# Patient Record
Sex: Female | Born: 1965
Health system: Southern US, Community
[De-identification: ages and names within clinical notes are randomized; demographics above are authoritative.]

## PROBLEM LIST (undated history)

## (undated) DIAGNOSIS — G90519 Complex regional pain syndrome I of unspecified upper limb: Secondary | ICD-10-CM

## (undated) DIAGNOSIS — G709 Myoneural disorder, unspecified: Secondary | ICD-10-CM

## (undated) DIAGNOSIS — K760 Fatty (change of) liver, not elsewhere classified: Secondary | ICD-10-CM

## (undated) DIAGNOSIS — F329 Major depressive disorder, single episode, unspecified: Secondary | ICD-10-CM

## (undated) DIAGNOSIS — K219 Gastro-esophageal reflux disease without esophagitis: Secondary | ICD-10-CM

## (undated) DIAGNOSIS — K2270 Barrett's esophagus without dysplasia: Secondary | ICD-10-CM

## (undated) DIAGNOSIS — Z8719 Personal history of other diseases of the digestive system: Secondary | ICD-10-CM

## (undated) DIAGNOSIS — G9050 Complex regional pain syndrome I, unspecified: Secondary | ICD-10-CM

## (undated) DIAGNOSIS — Z720 Tobacco use: Secondary | ICD-10-CM

## (undated) DIAGNOSIS — F32A Depression, unspecified: Secondary | ICD-10-CM

## (undated) DIAGNOSIS — F419 Anxiety disorder, unspecified: Secondary | ICD-10-CM

## (undated) DIAGNOSIS — R51 Headache: Secondary | ICD-10-CM

## (undated) DIAGNOSIS — M1990 Unspecified osteoarthritis, unspecified site: Secondary | ICD-10-CM

## (undated) DIAGNOSIS — Z8739 Personal history of other diseases of the musculoskeletal system and connective tissue: Secondary | ICD-10-CM

## (undated) DIAGNOSIS — J189 Pneumonia, unspecified organism: Secondary | ICD-10-CM

## (undated) DIAGNOSIS — J449 Chronic obstructive pulmonary disease, unspecified: Secondary | ICD-10-CM

---

## 1986-03-12 HISTORY — PX: TUBAL LIGATION: SHX77

## 2003-03-13 HISTORY — PX: ABDOMINAL HYSTERECTOMY: SHX81

## 2003-10-28 ENCOUNTER — Observation Stay (HOSPITAL_COMMUNITY): Admission: RE | Admit: 2003-10-28 | Discharge: 2003-10-29 | Payer: Self-pay | Admitting: Gynecology

## 2003-10-28 ENCOUNTER — Encounter (INDEPENDENT_AMBULATORY_CARE_PROVIDER_SITE_OTHER): Payer: Self-pay | Admitting: *Deleted

## 2004-12-11 ENCOUNTER — Emergency Department: Payer: Self-pay | Admitting: Emergency Medicine

## 2004-12-13 ENCOUNTER — Ambulatory Visit: Payer: Self-pay | Admitting: Family Medicine

## 2005-03-12 DIAGNOSIS — G90519 Complex regional pain syndrome I of unspecified upper limb: Secondary | ICD-10-CM

## 2005-03-12 HISTORY — DX: Complex regional pain syndrome I of unspecified upper limb: G90.519

## 2006-03-12 HISTORY — PX: CERVICAL DISC ARTHROPLASTY: SHX587

## 2006-03-12 HISTORY — PX: KNEE ARTHROSCOPY: SUR90

## 2006-07-19 ENCOUNTER — Ambulatory Visit: Payer: Self-pay | Admitting: Family Medicine

## 2006-07-23 ENCOUNTER — Ambulatory Visit: Payer: Self-pay | Admitting: Family Medicine

## 2006-10-18 ENCOUNTER — Ambulatory Visit (HOSPITAL_COMMUNITY): Admission: RE | Admit: 2006-10-18 | Discharge: 2006-10-19 | Payer: Self-pay | Admitting: Orthopedic Surgery

## 2007-03-13 HISTORY — PX: SPINAL CORD STIMULATOR IMPLANT: SHX2422

## 2007-04-02 ENCOUNTER — Ambulatory Visit: Payer: Self-pay | Admitting: Family Medicine

## 2007-04-16 ENCOUNTER — Ambulatory Visit (HOSPITAL_COMMUNITY): Admission: RE | Admit: 2007-04-16 | Discharge: 2007-04-18 | Payer: Self-pay | Admitting: Orthopedic Surgery

## 2007-09-24 ENCOUNTER — Ambulatory Visit: Payer: Self-pay | Admitting: Family Medicine

## 2008-02-16 ENCOUNTER — Encounter: Admission: RE | Admit: 2008-02-16 | Discharge: 2008-02-16 | Payer: Self-pay | Admitting: Orthopedic Surgery

## 2008-04-15 ENCOUNTER — Encounter: Admission: RE | Admit: 2008-04-15 | Discharge: 2008-04-15 | Payer: Self-pay | Admitting: Specialist

## 2008-10-11 IMAGING — RF DG CERVICAL SPINE 2 OR 3 VIEWS
1 series · 2 of 2 positions shown · non-contrast
Comparison: none

CLINICAL DATA: Chronic neck pain. 
 CERVICAL SPINE ? 04/16/07:

[Series 1: run · 2 of 2 slices shown]
[im 1/2]
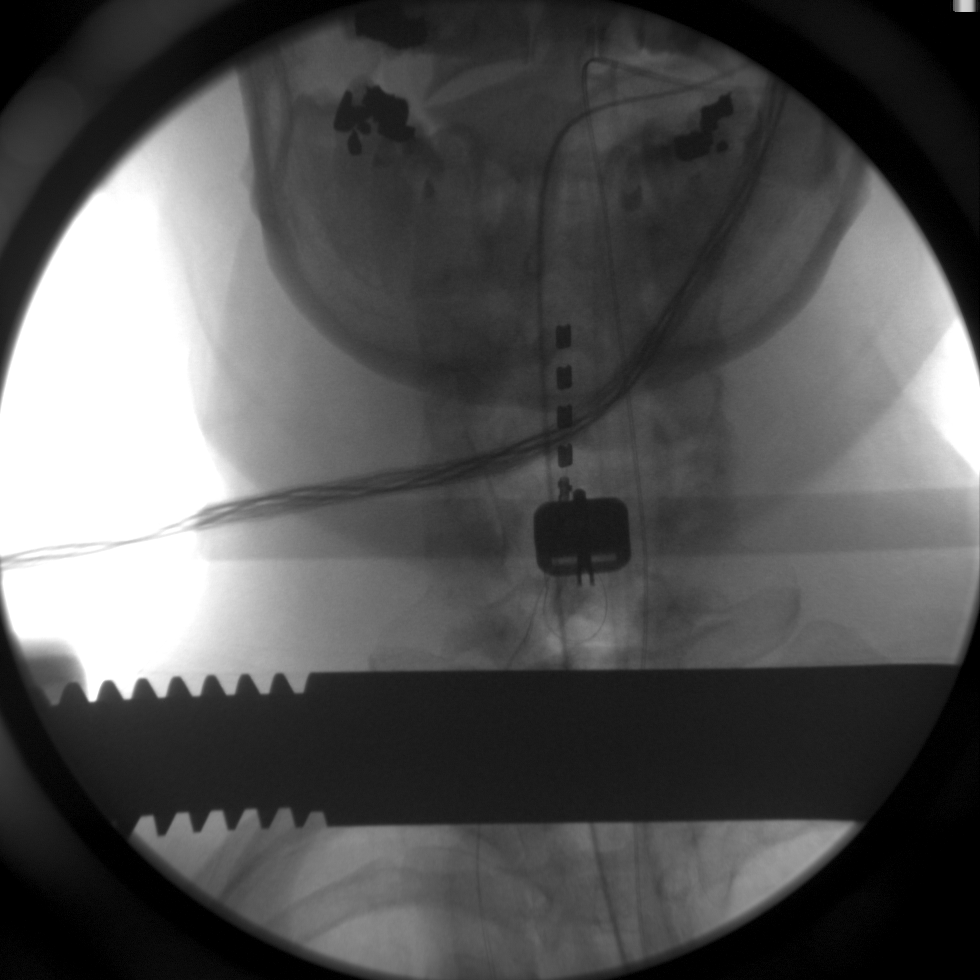
[im 2/2]
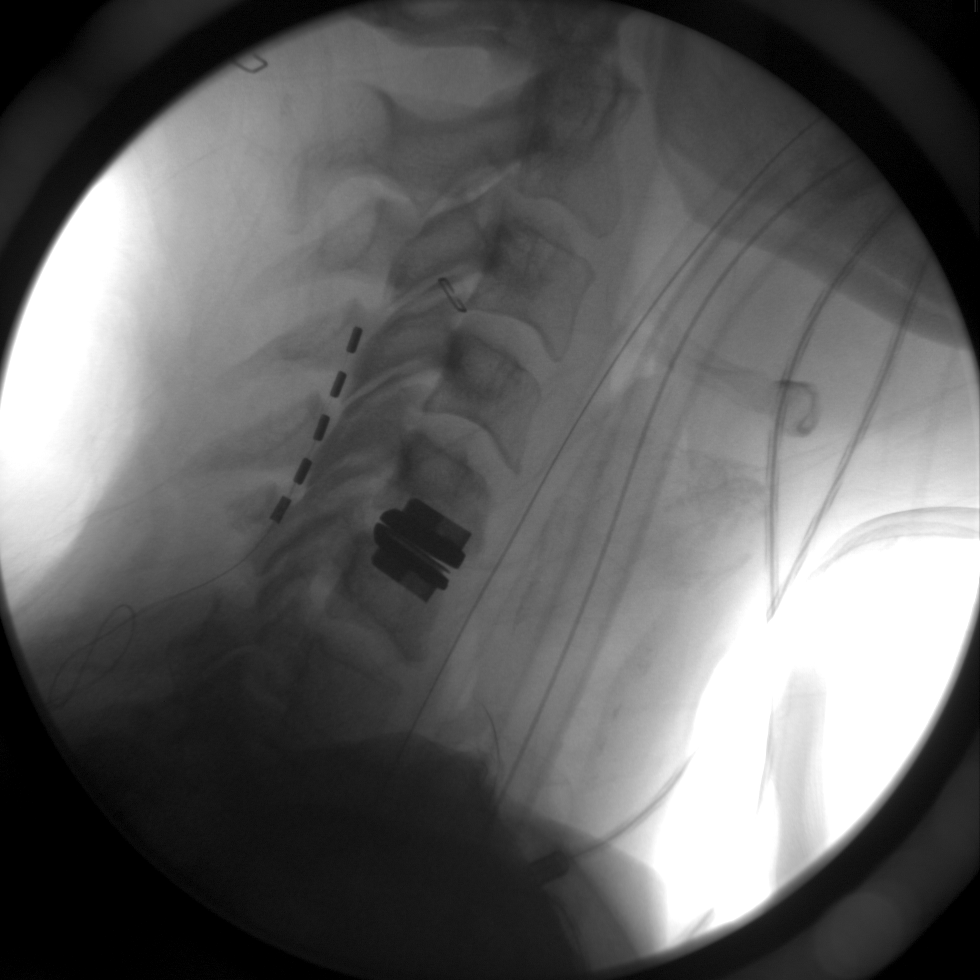

[2 of 2 positions shown; findings below may reference images not displayed]

FINDINGS: The patient has undergone insertion of cervical stimulator.  The superior aspect of the visible portion of the stimulator is at C4 and the inferior aspect of the stimulator is at C6 with the wire extending into the soft tissue below that level.
IMPRESSION: Stimulator inserted from C4 to C6.

## 2009-03-12 HISTORY — PX: OOPHORECTOMY: SHX86

## 2009-03-12 LAB — HM DEXA SCAN

## 2009-03-25 ENCOUNTER — Ambulatory Visit: Payer: Self-pay | Admitting: Family Medicine

## 2009-03-31 ENCOUNTER — Ambulatory Visit: Payer: Self-pay | Admitting: Family Medicine

## 2009-04-07 ENCOUNTER — Ambulatory Visit: Payer: Self-pay | Admitting: Family Medicine

## 2009-05-04 ENCOUNTER — Ambulatory Visit: Payer: Self-pay | Admitting: Obstetrics & Gynecology

## 2009-05-10 ENCOUNTER — Ambulatory Visit: Payer: Self-pay | Admitting: Obstetrics & Gynecology

## 2010-01-20 ENCOUNTER — Inpatient Hospital Stay: Payer: Self-pay | Admitting: Internal Medicine

## 2010-05-11 ENCOUNTER — Ambulatory Visit: Payer: Self-pay | Admitting: Family Medicine

## 2010-07-25 NOTE — Op Note (Signed)
NAMEMAPLE, ODANIEL              ACCOUNT NO.:  [id number]   MEDICAL RECORD NO.:  [id number]          PATIENT TYPE:  OIB   LOCATION:  5034                         FACILITY:  [facility]   PHYSICIAN:  Alvy Beal, MD    DATE OF BIRTH:  [birth year]   DATE OF PROCEDURE:  04/17/2007  DATE OF DISCHARGE:                               OPERATIVE REPORT   PREOPERATIVE DIAGNOSIS:  Migrating spinal cord stimulator.   POSTOPERATIVE DIAGNOSIS:  Migrating spinal cord stimulator.   OPERATIVE PROCEDURE:  Revision of spinal cord lead placement.   HISTORY:  Tara [name] is a very pleasant 45 year old woman who  underwent a spinal cord cervical stimulator yesterday.  The patient was  then later on when it was turned on noted only left-sided arm relief.  At that point we realized that the thin lead had migrated from the right  to the left where it was initially placed.  As a result of the absence  of coverage on the right side, we elected to take her back to the  operating room today to reposition it and further secured into position.   All appropriate risks, benefits and alternatives to surgery were  discussed and consent was obtained.   OPERATIVE NOTE:  The patient is brought to the operating room, placed on  the operating table.  After successful induction of general anesthesia  with endotracheal intubation, the patient was turned prone onto a Wilson  frame.  The arms were tucked at the side and the neck and back were  prepped and draped in standard fashion.  The vertical nylon sutures were  removed and the wound was reopened.  The superficial 2-0 Vicryl and 2-0  and 1 Vicryl sutures were removed to expose the cervical spine.  At this  point I removed the lead without difficulty.  I then repalpated the  plane on that right side and freely passed the lead back over on the  right side.  At this time I took a second plastic tack and then sutured  the lead to the right-hand side of the right  paraspinal muscles and  fascia.  This ensured that it would not migrate over to the left again.  At this point I tested the battery again, we had excellent function.  I  then irrigated the wound copiously with normal saline, closed the deep  fascia, interrupted #1 Vicryl sutures, superficial 2-0 and 3-0 vertical  nylon for the skin.  A dry dressing was applied as was soft collar.  The  patient was extubated, transferred PACU without incident.  End of the  case all needle, sponge counts were correct.   FIRST ASSISTANT:  Crissie Reese, PA      Alvy Beal, MD  Electronically Signed     DDB/MEDQ  D:  04/17/2007  T:  04/18/2007  Job:  [id number]

## 2010-07-25 NOTE — Op Note (Signed)
Tara [name], Tara [name]              ACCOUNT NO.:  [id number]   MEDICAL RECORD NO.:  [id number]          PATIENT TYPE:  AMB   LOCATION:  SDS                          FACILITY:  [facility]   PHYSICIAN:  Alvy Beal, MD    DATE OF BIRTH:  1965/12/14   DATE OF PROCEDURE:  10/18/2006  DATE OF DISCHARGE:                               OPERATIVE REPORT   PREOPERATIVE DIAGNOSIS:  Hard disk osteophyte with C6 radicular right  arm pain.   POSTOPERATIVE DIAGNOSIS:  Hard disk osteophyte with C6 radicular right  arm pain.   OPERATIVE PROCEDURE:  Total disk replacement C5-6 using a Synthes  ProDisc C.   COMPLICATIONS:  None.   CONDITION:  Stable.   HISTORY:  This is a very pleasant woman with significant right arm pain.  She also had a degenerative hard disk osteophyte noted posterolateral to  the right at C5-6 on MRI.  She was diagnosed with the right C6  radiculopathy complicated by a right arm RSD.  Multiple attempts at  physiotherapy, injection therapy have failed to relieve her symptoms. As  a result, she presented to me for further treatment.   After discussing treatment options, we elected to proceed with a total  disk replacement to preserve a motion, decompress the nerve and  alleviate her component of her right arm.  All appropriate risks,  benefits and alternatives were discussed and consent was obtained.   OPERATIVE NOTE:  The patient is brought to the operating room, placed  supine on the operating table.  After successful induction of general  anesthesia and endotracheal intubation, TEDs, SCDs and a Foley were  applied.  Appropriate intraoperative evoked motor, sensory and running  EMGs potential were connected to the patient.  It should be noted that  at no point in time were there any adverse electrodiagnostic readings.   A roll of towels was placed behind the shoulder blades.  The neck was  positioned in slight extension and the head in neutral.  The shoulder  were secured  down and the neck was prepped and draped in standard  fashion.  A transverse incision was made over the C5-6 disk space.  Sharp dissection was carried out down to and through the platysma.  I  then sharply dissected in a deep cervical fascia along the lateral  border of the sternocleidomastoid muscle.  I was able to then bluntly  dissect through the cervical and prevertebral fascia.  I kept the  esophagus and trachea medial and protected it with a finger and I  palpated and visualized the carotid sheath laterally.  Then I was able  to palpate the anterior aspect of the cervical spine.  I then swept the  trachea and esophagus medially and protected it with appendiceal  retractor.  I then resected the remaining the deep paravertebral fascia  to expose the entire C5 and C6 vertebral body.  I then marked the  interbody disk space and took an x-ray to confirm that this was the  appropriate level.   Once confirmed that I was at the appropriate level, I then resected the  anterior longitudinal ligament and mobilized the longus colli muscles  out to the level of the uncovertebral joints.  Strict hemostasis was  obtained using bipolar electrocautery.  The Caspar self-retaining  retractors were placed deep into the wound and then the endotracheal  cuff was deflated and the blades were expanded to the appropriate width.  At this point, with the visualization adequate, I then used AP  fluoroscopy to confirm the midline position on the cervical spine.  Once  I confirmed the midline of C5 and C6, I then took a marking awl and  marked this.   I then switched to the lateral and then advanced the distraction pin at  the superior margin of C5, parallel to the C5 inferior endplate.  I then  placed another distraction pin at the inferior aspect of C6 again  parallel to the superior endplate of C6.  Once distraction pins were in  place I then proceeded with our decompression.   An annulotomy was performed  with a 15 blade scalpel.  Using a  combination of pituitary rongeurs and curettes and Kerrison rongeurs, I  resected the majority of the disk all the way down to the posterior  annulus.  I then placed in the Synthes ProDisc C distractor into the  wound, distracted the intervertebral space and then used the distraction  pins to maintain by distraction.  I then removed the distractor and I  was able to use a fine curette to develop a plane behind the posterior  annulus and posterior longitudinal ligament.  Then using a 1 mm  Kerrison, I resected the entire posterior longitudinal ligament.  I also  went out the right side and took down most of the osteophytes that were  compressing that right C6 nerve root.   There was a small soft disk protrusion noted on that right side.  At  this point, once I was satisfied with my decompression, I irrigated the  wound copiously with normal saline and then placed the five large trial  into the wound.  This had adequate medial, lateral, anterior, posterior  depth.  With the device secured in place, I then put the locking pin  superiorly and the mill inferiorly and then milled the trough on the  superior endplate of C6.  I then switched the locking pin and then  milled the trough at C5.  At this point, I irrigated the wound again and  then used the trough curette to clean out the trough.   I then took the five large prosthesis and malleted to the appropriate  depth.  I confirmed the depth on the lateral view and it was  satisfactory.   With the prosthesis properly settled, I then removed all of the  retainers and then under live fluoro, took the neck through a range of  motion.  The range of motion was satisfactory and then I directly  visualized the disk and took it through a range of motion and it was  satisfactory.   At this point, with the disk properly placed, I then irrigated the wound  copiously with normal saline and used bone wax to obtain  hemostasis from  the screw hole site and from any exposed bone site.  Once I had strict  hemostasis, I then returned the trachea and esophagus to midline, closed  the platysma with 2-0 Vicryl sutures and used the 3-0 Monocryl for the  skin.  Steri-Strips and dry dressing were applied as was a soft collar.  The patient was then extubated and transferred to the PACU without  incident.  At the end of the case, all needle and sponge counts were  correct.      Alvy Beal, MD  Electronically Signed     DDB/MEDQ  D:  10/18/2006  T:  10/18/2006  Job:  [id number]

## 2010-07-25 NOTE — Op Note (Signed)
NAMEANNALIS, KACZMARCZYK              ACCOUNT NO.:  [id number]   MEDICAL RECORD NO.:  [id number]          PATIENT TYPE:  AMB   LOCATION:  SDS                          FACILITY:  [facility]   PHYSICIAN:  Alvy Beal, MD    DATE OF BIRTH:  12-Aug-1965   DATE OF PROCEDURE:  04/16/2007  DATE OF DISCHARGE:                               OPERATIVE REPORT   PREOPERATIVE DIAGNOSIS:  Chronic debilitating right arm pain.   POSTOPERATIVE DIAGNOSIS:  Chronic debilitating right arm pain.   OPERATIVE PROCEDURE:  Insertion of cervical spinal cord stimulator.   HISTORY:  This is a very pleasant 45 year old woman who in the past  underwent a cervical total disk replacement for severe arm pain and neck  pain.  She states that some of her arm pain recovered but she still had  the RSD pain.  As a result, a cervical spinal stimulator trial was  undertaken and she states there was improvement in her overall pain.  As  a result, the decision was made to place a permanent indwelling  catheter.  All appropriate risks, benefits and alternatives were  discussed with the patient.  Consent was obtained.   OPERATIVE NOTE:  The patient was brought to the operating room and  placed supine upon the operating table.  After successful induction of  general anesthesia and endotracheal intubation, she was placed prone  onto a Wilson frame.  The neck was placed into flexion, the arms were  placed at the side, and all bony prominences well-padded.   The neck and entire spine were prepped and draped in standard fashion.  A midline cervical incision was made and sharp dissection was carried  out down through the fascia down to the deep fascia.  Deep fascia was  sharply incised to expose the tops of the spinous processes of C5, C6  and C7.  I then removed the paraspinal musculature with a Cobb elevator  bilaterally to expose the lamina.  Care was taken not to violate the  facet capsule (facet complex) as this may cause  instability of the  cervical disk anteriorly.  With the lamina and spinous process exposed,  I then resected the inferior portion of the C6 spinous process.  I then  developed a plane underneath the C6 lamina and used a 2-mm Kerrison to  perform a laminotomy bilaterally.  At this point I then developed a  plane using a fine curette through the raphe of the ligamentum flavum.  I resected ligamentum flavum to expose the underlying thecal sac.  At  this point with the thecal sac exposed, I gently dissected through the  adherent ligamentum flavum to be able to pass the electric catheter.  I  then was able to freely pass the catheter without resistance.  At this  point I then secured it.  I then used a 2-0 FiberWire through the  spinous process to secure the lead to the spinous process.  I then  wrapped it around the spinous process and secured it again with a #1  Vicryl suture.  With the lead properly positioned, I confirmed central  and slightly to the right positioning with the AP and lateral  fluoroscopy views.  At this point having reproduced the preoperative  lead placement, I then made the left-hand incision over the inferior  aspect of her belt line.  I then sharply dissected down to a depth of  2.5 cm and then created a pocket for the battery.  I then tested the  lead to ensure that it was working satisfactorily.  I then passed the  leads submuscular from the cervical incision down to the lower pelvic  incision.  I then sutured the battery in place with a 2-0 Fibrewire and  then checked it again to ensure that after tunneling the lead in  position to the battery that it worked.  Once confirmed, I irrigated the  wounds copiously with normal saline, closed the deep fascia of both  wounds with interrupted #1 Vicryl sutures and then superficial with 2-0  Vicryl sutures.  For the neck a 3-0 nylon running vertical mattress  suture was used to close  the skin.  For the lower pelvic wound a  3-0 Monocryl was used.  Steri-  Strips, dry dressing applied.  The patient was extubated and transferred  to the PACU without incident.  At the end of the case all needle and  sponge counts were correct.      Alvy Beal, MD  Electronically Signed     DDB/MEDQ  D:  04/16/2007  T:  04/17/2007  Job:  [id number]

## 2010-07-28 NOTE — Op Note (Signed)
Tara [name], Tara [name]                          ACCOUNT NO.:  [id number]   MEDICAL RECORD NO.:  [id number]                   PATIENT TYPE:  OBV   LOCATION:  9399                                 FACILITY:  [facility]   PHYSICIAN:  Ginger Carne, MD               DATE OF BIRTH:  12/27/1965   DATE OF PROCEDURE:  10/28/2003  DATE OF DISCHARGE:                                 OPERATIVE REPORT   PREOPERATIVE DIAGNOSES:  Menometrorrhagia.   POSTOPERATIVE DIAGNOSES:  Menometrorrhagia.   PROCEDURE:  Total vaginal hysterectomy with preservation of both tubes and  ovaries.   SURGEON:  Ginger Carne, MD   ASSISTANT:  Burnadette Peter, M.D.   ESTIMATED BLOOD LOSS:  Less than 75 mL.   COMPLICATIONS:  None immediate.   ANESTHESIA:  General.   SPECIMENS:  Uterus and cervix with preservation of both tubes and ovaries.   FINDINGS:  The uterus was upper limits of normal size at about eight weeks.  Both tubes and ovaries appeared normal.   DESCRIPTION OF PROCEDURE:  The patient was prepped and draped in the usual  fashion and placed in the supine lithotomy position. Betadine solution used  for antiseptic and the patient was catheterized prior to the procedure.  After adequate general anesthesia, a double tooth tenaculum placed on the  anterior and posterior lips of the cervix.  20 mL of Sensorcaine with 0.5%  epinephrine utilized circumferentially around the cervix. Two centimeters of  anterior and posterior vaginal epithelium were incised transversely.  Peritoneal reflection is opened without incident, uterosacral cardinal  ligament complex was clamped, cut and ligated with #0 Vicryl suture and  affixed to their respective vaginal walls. The uterine vasculatures,  ascending branches, broad ligaments were clamped, cut, and ligated with  transfixation #0 Vicryl suture, round ligaments taken separately with #0  Vicryl suture and the uteroovarian ligaments taken with #0 Vicryl suture.  Bleeding points hemostatically checked, blood clots removed from the  abdomen. Closure of the cuff in one layer with #0 Monocryl running  interlocking suture. The patient tolerated the procedure well and went to  the post anesthesia recovery room in excellent condition.                                               Ginger Carne, MD    SHB/MEDQ  D:  10/28/2003  T:  10/29/2003  Job:  [id number]

## 2010-07-28 NOTE — Discharge Summary (Signed)
NAMETHAILY, HACKWORTH                          ACCOUNT NO.:  [id number]   MEDICAL RECORD NO.:  [id number]                   PATIENT TYPE:  OBV   LOCATION:  9124                                 FACILITY:  [facility]   PHYSICIAN:  Ginger Carne, MD               DATE OF BIRTH:  1965-07-26   DATE OF ADMISSION:  10/28/2003  DATE OF DISCHARGE:                                 DISCHARGE SUMMARY   FINAL DISCHARGE DIAGNOSES:  Menometrorrhagia.   IN-HOSPITAL PROCEDURES:  Total vaginal hysterectomy with preservation of  both tubes and ovaries.   HOSPITAL COURSE:  This patient is a 45 year old Caucasian female who  underwent the aforementioned procedures on October 28, 2003 because of a  longstanding history of menometrorrhagia.  Her postoperative course was  uneventful.  She was afebrile, voided well with minimal vaginal drainage.  Her hemoglobin postoperatively was 11.4 from a preoperative value of 13.2.  Routine postoperative and postpartum instructions provided to said patient.  She received Percocet 5/325 one to two q.4-6h. for prescription analgesia  and asked to return to the office in 4 weeks.  She was advised to call the  office if she develops a temperature greater than 100.4 degrees Fahrenheit,  vaginal bleeding, increased vaginal discharge, abdominal pain, or flank  pain.                                               Ginger Carne, MD    SHB/MEDQ  D:  10/29/2003  T:  10/29/2003  Job:  [id number]

## 2010-07-28 NOTE — H&P (Signed)
Tara [name], Tara [name]                          ACCOUNT NO.:  [id number]   MEDICAL RECORD NO.:  [id number]                   PATIENT TYPE:  AMB   LOCATION:  SDC                                  FACILITY[facility]   PHYSICIAN:  Ginger Carne, MD               DATE OF BIRTH:  Sep 21, 1965   DATE OF ADMISSION:  10/24/2003  DATE OF DISCHARGE:                                HISTORY & PHYSICAL   ADMITTING DIAGNOSIS:  Menometrorrhagia with severe dysmenorrhea.   HISTORY OF PRESENT ILLNESS:  This patient is a 46 year old gravida 2, para 0-  0-0-2 Caucasian female admitted for a total vaginal hysterectomy and  preservation of both tubes and ovaries.  The patient states that over the  past 18 months to 2 years she has had worsening dysmenorrhea and bleeding  lasting anywhere from 8-12 days out of the 30 days.  She denies pain between  her menses or dyspareunia but has discomfort that has prohibited her from  going to work at least for 3-4 days out of the month because of her  discomfort.  She has been on various forms of oral contraceptives over the  past year by the primary care physician with minimal benefit.  The patient  has had a history of cryosurgery in the past leading to a significantly  stenosed cervix contributing to her discomfort.  A sonohysterogram  (transvaginal) ultrasound demonstrates no evidence of intracavitary lesions.  However, there is adenomyosis noted.  An endometrial biopsy revealed no  evidence for neoplasia or hyperplasia.  Said procedure required significant  local analgesia and dilatation of the cervix.  She takes no medications to  enhance her bleeding propensity and has no personal family history of  bleeding diatheses.  She does not have any endocrinological sources for her  bleeding.   OBSTETRICAL GYNECOLOGICAL HISTORY:  The patient has had two vaginal  deliveries in 1984 and 1988 followed by a tubal ligation.  She had  cryosurgery in 1997.   CURRENT  MEDICATIONS:  Ambien 5 mg as needed.   ALLERGIES:  None.   SOCIAL HISTORY:  Positive for smoking one pack of cigarettes per day but  denies alcohol or illicit drug abuse.   FAMILY HISTORY:  Negative for breast, colon, ovarian, uterine carcinoma.   REVIEW OF SYSTEMS:  Negative.   SOCIAL HISTORY:  Negative.   PHYSICAL EXAMINATION:  GENERAL:  Physical exam reveals a pleasant female in  no acute distress.  VITAL SIGNS:  Weight 138 pounds, blood pressure 98/46, height 5 feet 8-1/2  inches.  HEENT:  Grossly normal.  CARDIOVASCULAR:  Normal, S1 and S2 within normal limits.  Regular rate and  rhythm.  Without murmurs.  BREASTS:  Without masses, discharge, thickening or tenderness.  CHEST:  Clear to percussion and auscultation  EXTREMITIES:  Within normal limits.  LYMPHATICS:  Within normal limits.  SKIN:  Within normal limits.  NEUROLOGICAL:  Within normal  limits.  MUSCULOSKELETAL:  Within normal limits.  ABDOMEN:  Soft without hepatosplenomegaly.  PELVIC:  External genitalia, vulva, and vagina normal.  Cervix smooth  without erosions or lesions.  The uterus is upper limits of normal size.  Both adnexal are palpable and found to be normal.  Pap smear is normal.  RECTAL:  Hemoccult negative without mass.   IMPRESSION:  1. Menometrorrhagia.  2. Severe dysmenorrhea with cervical stenosis.   PLAN:  The patient has no desire for further childbearing.  She has unable  to utilize oral contraceptives because of her history of smoking.  She has  tried Norethindrone acetate and Provera by her primary care physician with  minimal benefit to control her bleeding.  She declines an intrauterine  device and has also utilized significant amounts of ibuprofen to help  control bleeding and pain with minimal benefit.  She also declines the use  of Depo-Provera because of its side effects and also does not wish to have  an endometrial thermal balloon ablation.  For this reason, a total vaginal   hysterectomy with preservation of both tubes and ovaries was discussed and  accepted by the patient.  She has no psychological or medical indications  that would outweigh the benefits of said surgery.  Ashby Dawes of said surgery  discussed in detail with the patient.  Risks including injuries to bowel and  bladder, possible conversion to laparoscopic or open procedure, hemorrhage  possibly requiring blood transfusion, infection and other unforeseen  complications discussed and understood by said patient.                                               Ginger Carne, MD    SHB/MEDQ  D:  10/24/2003  T:  10/24/2003  Job:  [id number]

## 2010-07-28 NOTE — H&P (Signed)
Tara [name], [name]              ACCOUNT NO.:  [id number]   MEDICAL RECORD NO.:  [id number]          PATIENT TYPE:  OIB   LOCATION:  5019                         FACILITY:  [facility]   PHYSICIAN:  Alvy Beal, MD    DATE OF BIRTH:  [birth year]   DATE OF ADMISSION:  10/18/2006  DATE OF DISCHARGE:  10/19/2006                              HISTORY & PHYSICAL   HISTORY:  Tara [name] is a very pleasant 45 year old woman who has been  having severe complaints of pain in neck and right upper extremity for a  very long period of time.  She was initially seen and treated by my  partner, Dr. Amanda Pea, for an ulnar nerve entrapment.  She was also  diagnosed with a right arm complex regional pain syndrome.  However, she  was still complaining of significant neck pain and was referred to me  for further treatment.  Upon my evaluation, the patient had a component  of a C6 radiculopathy on that right side.  She had some weakness in the  biceps as well as sensory changes in the C6 distribution on the right.  X-rays demonstrated a soft disk/osteophyte complex causing right  foraminal compromise and nerve root irritation at C5-6.  After attempts  at conservative management consisting of physiotherapy and injection  therapy had failed to alleviate her symptoms, we elected to proceed with  surgery.   All appropriate risks, benefits, and alternatives to the procedure were  explained to her.   The patient was ultimately admitted for a total disk replacement at C5-6  versus an anterior cervical diskectomy and fusion at C5-6.   PAST MEDICAL HISTORY:  Outlined in my office note which has been  included from September 02, 2006.  There were no changes.  Please refer to it  for specifics.   PAST SURGICAL HISTORY:  Outlined in my office note which has been  included from September 02, 2006.  There were no changes.  Please refer to it  for specifics.   FAMILY HISTORY:  Outlined in my office note which has been included  from  September 02, 2006.  There were no changes.  Please refer to it for  specifics.   SOCIAL HISTORY:  Outlined in my office note which has been included from  September 02, 2006.  There were no changes.  Please refer to it for  specifics.      Alvy Beal, MD  Electronically Signed     DDB/MEDQ  D:  11/19/2006  T:  11/20/2006  Job:  [id number]

## 2010-09-26 IMAGING — US US PELV - US TRANSVAGINAL
1 series · 17 of 25 positions shown · non-contrast
Comparison: none

REASON FOR EXAM: llq abdominal pain
COMMENTS:

[Series 1: us pelv - us transvaginal · 17 of 64 slices shown]
[im 1/64]
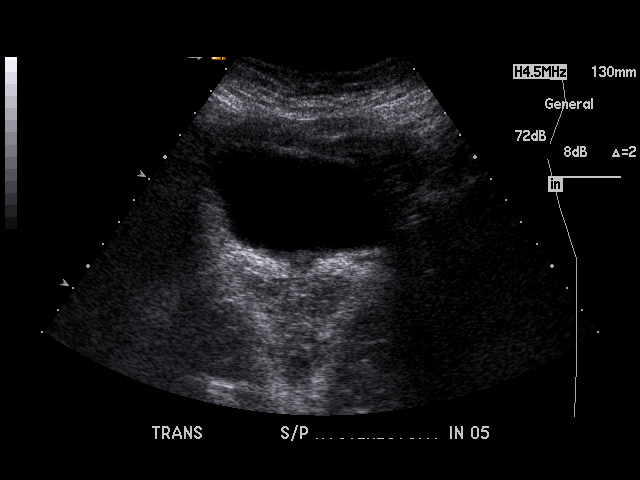
[im 6/64]
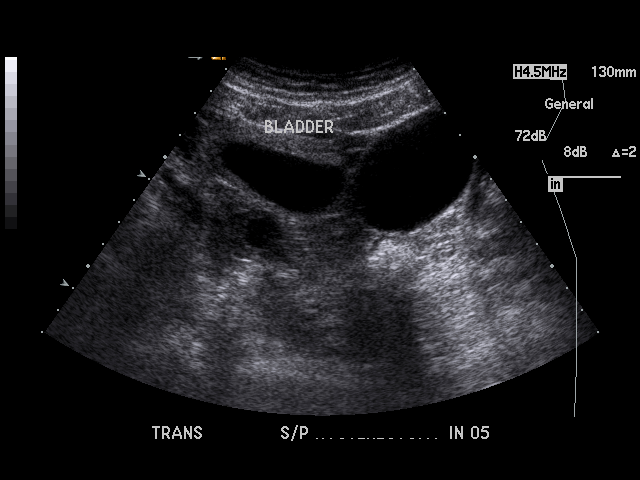
[im 8/64]
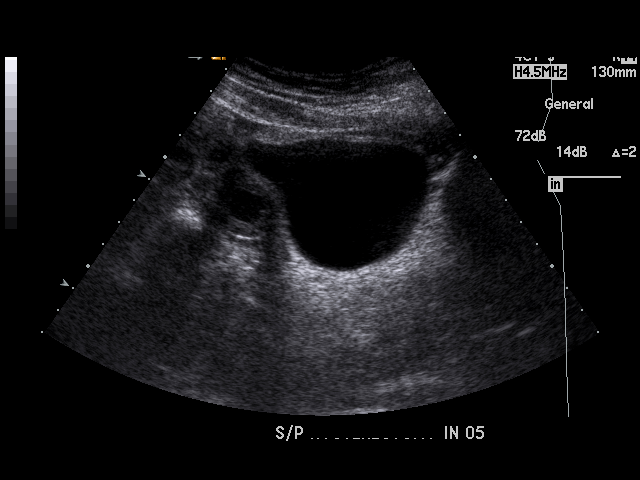
[im 14/64]
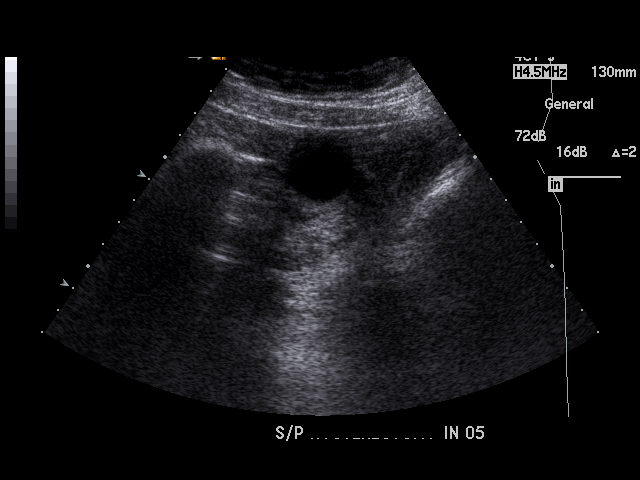
[im 16/64]
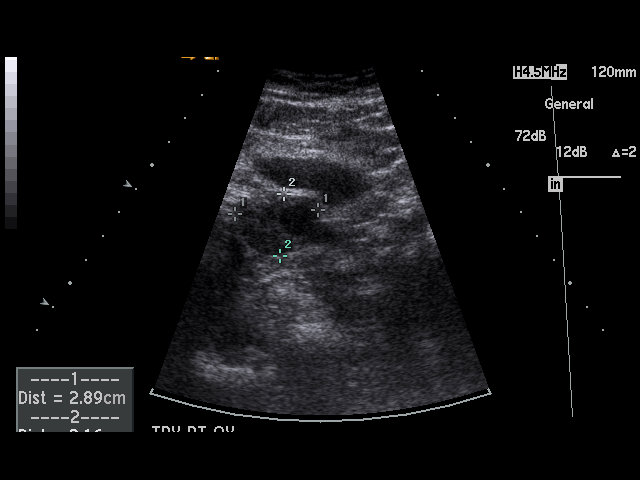
[im 22/64]
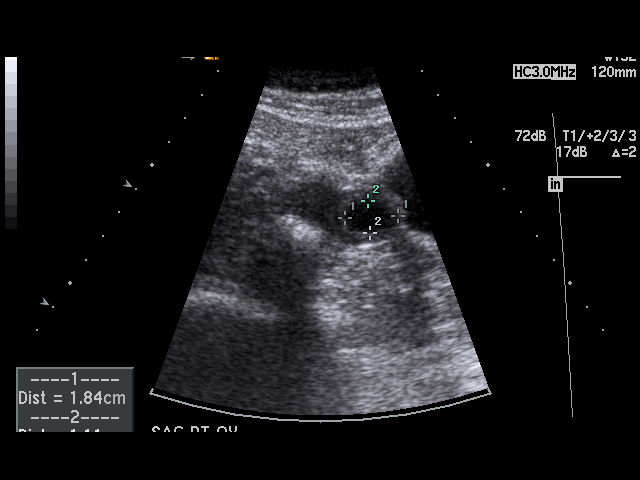
[im 24/64]
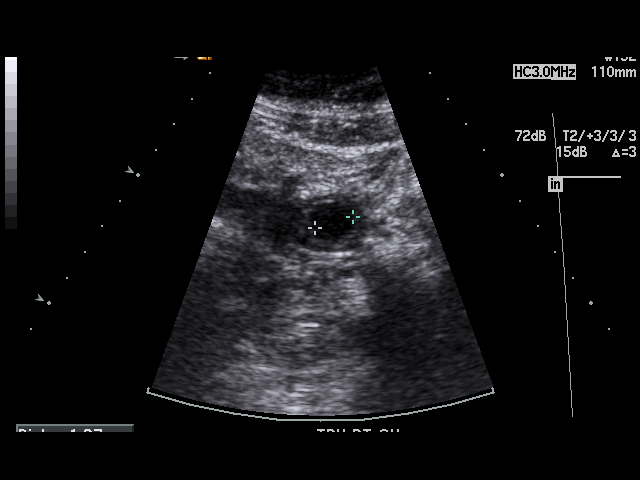
[im 29/64]
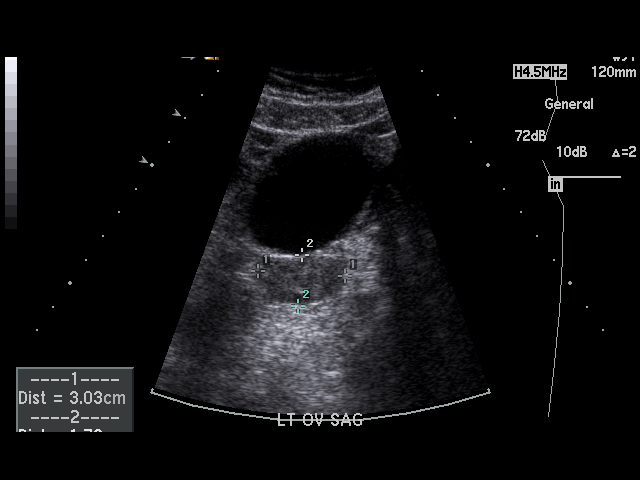
[im 32/64]
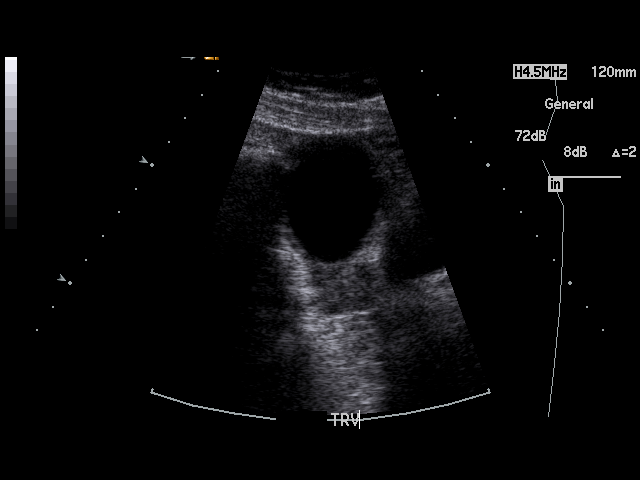
[im 35/64]
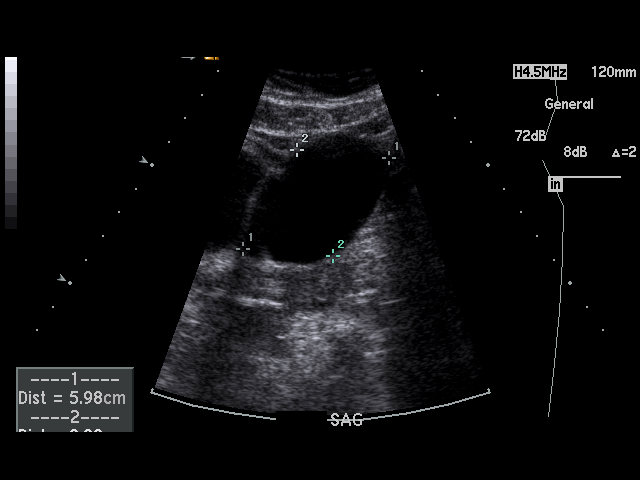
[im 40/64]
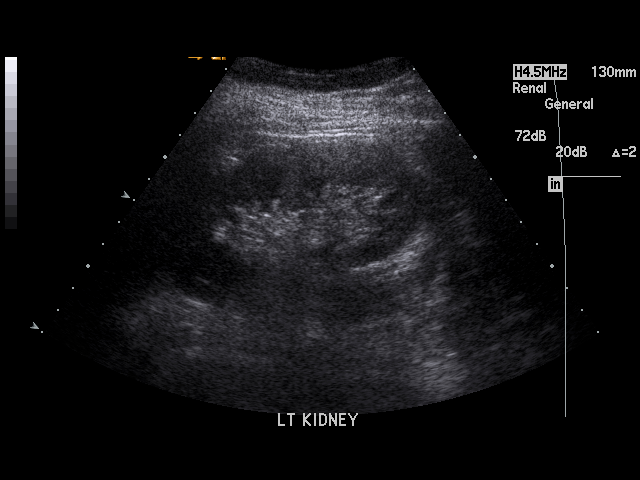
[im 43/64]
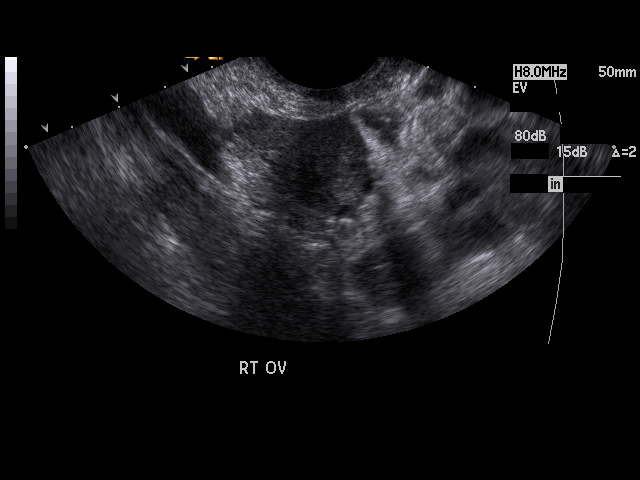
[im 48/64]
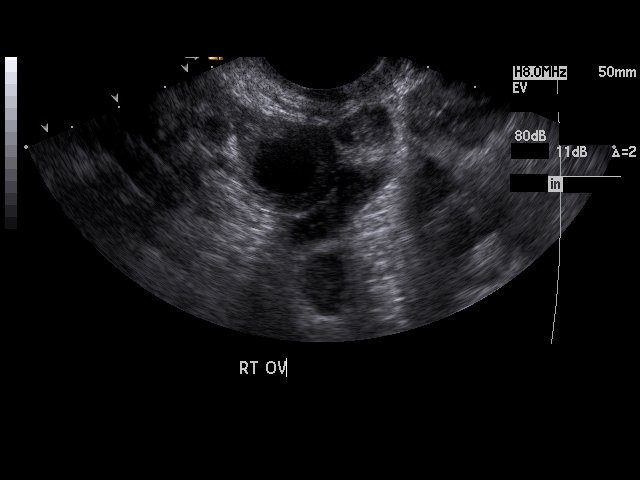
[im 50/64]
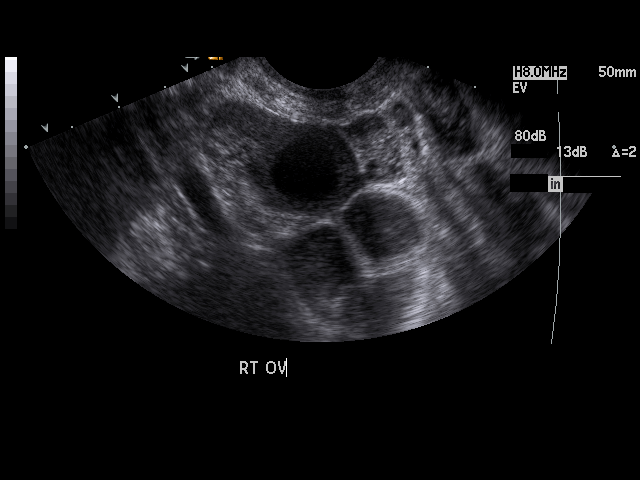
[im 56/64]
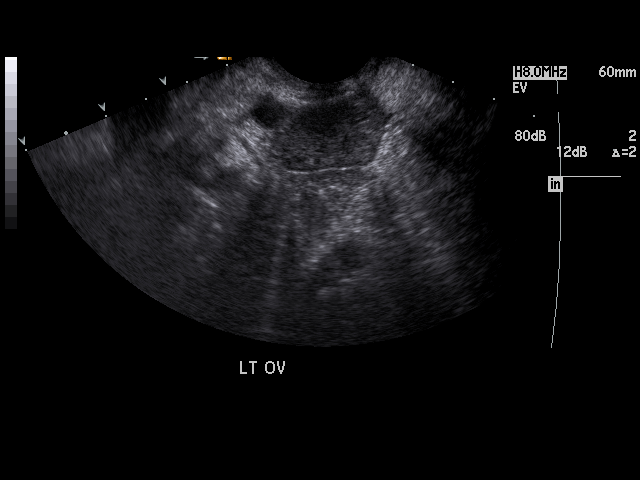
[im 58/64]
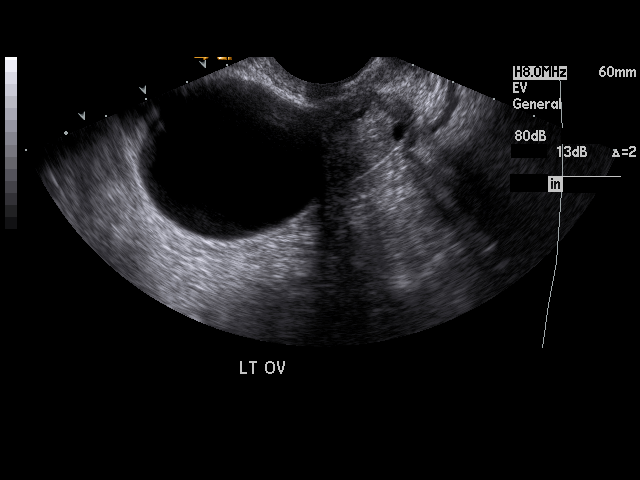
[im 64/64]
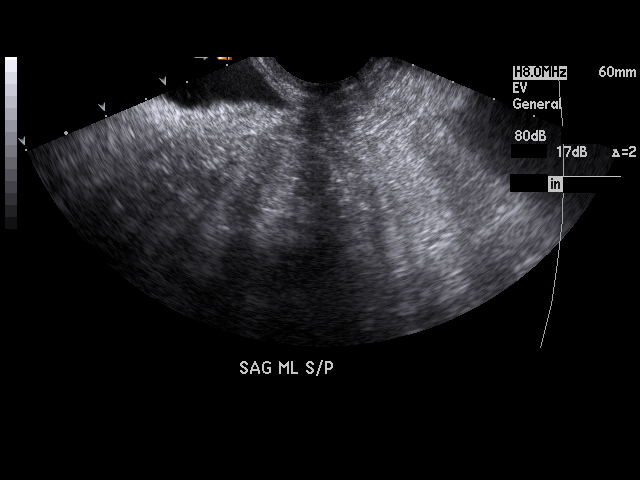

[17 of 25 positions shown; findings below may reference images not displayed]

PROCEDURE:     US  - US PELVIS EXAM W/TRANSVAGINAL  - March 31, 2009 [DATE]

RESULT:     The patient has a history of hysterectomy. Endovaginal scanning
was performed along with transabdominal imaging. The study demonstrates the
right ovary measures 2.9 x 2.2 x 1.9 cm. The left ovary measures 2.73 x
x 1.73 cm. There is a cystic area affiliated with the right ovary measuring
1.8 x 1.1 x 1.3 cm. This has a somewhat prominent wall. There is a left
adnexal cystic structure adjacent to the area of the left ovary. It is
difficult to say that this actually arises from the left ovary but this
measures approximately 6.0 x 3.9 x 5.0 cm. There is no free fluid or urinary
obstruction. The endovaginal images suggest the adnexal cystic structure
actually does arise from the left ovary. This was uncertain on the
transabdominal images.
IMPRESSION: 1. Large left ovarian cyst. Gynecologic follow-up is recommended.
2. Smaller right ovarian cyst although the wall is somewhat thickened.
Follow-up of this is also recommended.

## 2010-10-03 ENCOUNTER — Ambulatory Visit: Payer: Self-pay | Admitting: Family Medicine

## 2010-12-01 LAB — CBC
RBC: 4.32
WBC: 6.5

## 2010-12-04 ENCOUNTER — Other Ambulatory Visit: Payer: Self-pay | Admitting: Orthopedic Surgery

## 2010-12-04 DIAGNOSIS — M5126 Other intervertebral disc displacement, lumbar region: Secondary | ICD-10-CM

## 2010-12-04 DIAGNOSIS — M5136 Other intervertebral disc degeneration, lumbar region: Secondary | ICD-10-CM

## 2010-12-13 ENCOUNTER — Ambulatory Visit
Admission: RE | Admit: 2010-12-13 | Discharge: 2010-12-13 | Disposition: A | Discharge status: Home / Self Care | Payer: BC Managed Care – PPO | Source: Ambulatory Visit | Attending: Orthopedic Surgery | Admitting: Orthopedic Surgery

## 2010-12-13 DIAGNOSIS — M5136 Other intervertebral disc degeneration, lumbar region: Secondary | ICD-10-CM

## 2010-12-13 DIAGNOSIS — M5126 Other intervertebral disc displacement, lumbar region: Secondary | ICD-10-CM

## 2010-12-13 MED ORDER — HYDROMORPHONE HCL 2 MG/ML IJ SOLN
1.0000 mg | Freq: Once | INTRAMUSCULAR | Status: AC
  Administered 2010-12-13: 1 mg via INTRAMUSCULAR

## 2010-12-13 MED ORDER — IOHEXOL 180 MG/ML  SOLN
20.0000 mL | Freq: Once | INTRAMUSCULAR | Status: AC | PRN
  Administered 2010-12-13: 20 mL via INTRATHECAL

## 2010-12-13 MED ORDER — ONDANSETRON HCL 4 MG/2ML IJ SOLN
4.0000 mg | Freq: Once | INTRAMUSCULAR | Status: AC
  Administered 2010-12-13: 4 mg via INTRAMUSCULAR

## 2010-12-13 MED ORDER — DIAZEPAM 2 MG PO TABS
10.0000 mg | ORAL_TABLET | Freq: Once | ORAL | Status: AC
  Administered 2010-12-13: 10 mg via ORAL

## 2010-12-19 ENCOUNTER — Other Ambulatory Visit: Payer: Self-pay | Admitting: Orthopedic Surgery

## 2010-12-19 ENCOUNTER — Ambulatory Visit
Admission: RE | Admit: 2010-12-19 | Discharge: 2010-12-19 | Disposition: A | Discharge status: Home / Self Care | Payer: BC Managed Care – PPO | Source: Ambulatory Visit | Attending: Orthopedic Surgery | Admitting: Orthopedic Surgery

## 2010-12-19 DIAGNOSIS — G971 Other reaction to spinal and lumbar puncture: Secondary | ICD-10-CM

## 2010-12-19 MED ORDER — IOHEXOL 180 MG/ML  SOLN
1.0000 mL | Freq: Once | INTRAMUSCULAR | Status: AC | PRN
  Administered 2010-12-19: 1 mL via EPIDURAL

## 2010-12-19 NOTE — Progress Notes (Signed)
Blood drawn from left antecubital vein, 20cc, site unremarkable and pt tolerated it well.

## 2010-12-25 LAB — BASIC METABOLIC PANEL
Calcium: 9.1
Creatinine, Ser: 0.78
GFR calc non Af Amer: >60
Glucose, Bld: 92
Sodium: 139

## 2010-12-25 LAB — CBC
Hemoglobin: 14.2
MCHC: 35
Platelets: 333
RDW: 12.8

## 2010-12-25 LAB — URINALYSIS, ROUTINE W REFLEX MICROSCOPIC
Bilirubin Urine: NEGATIVE
Hgb urine dipstick: NEGATIVE
Ketones, ur: NEGATIVE
Nitrite: NEGATIVE
Protein, ur: NEGATIVE
Specific Gravity, Urine: 1.006
Urobilinogen, UA: 0.2

## 2010-12-25 LAB — PROTIME-INR
INR: 1
Prothrombin Time: 12.9

## 2010-12-25 LAB — APTT: aPTT: 30

## 2010-12-27 ENCOUNTER — Ambulatory Visit: Payer: Self-pay | Admitting: Gastroenterology

## 2010-12-29 LAB — PATHOLOGY REPORT

## 2011-01-19 ENCOUNTER — Encounter (HOSPITAL_COMMUNITY): Payer: Self-pay | Admitting: Pharmacy Technician

## 2011-01-22 ENCOUNTER — Encounter (HOSPITAL_COMMUNITY)
Admission: RE | Admit: 2011-01-22 | Discharge: 2011-01-22 | Disposition: A | Discharge status: Home / Self Care | Payer: Worker's Compensation | Source: Ambulatory Visit | Attending: Orthopedic Surgery | Admitting: Orthopedic Surgery

## 2011-01-22 ENCOUNTER — Encounter (HOSPITAL_COMMUNITY): Payer: Self-pay

## 2011-01-22 HISTORY — DX: Unspecified osteoarthritis, unspecified site: M19.90

## 2011-01-22 HISTORY — DX: Major depressive disorder, single episode, unspecified: F32.9

## 2011-01-22 HISTORY — DX: Pneumonia, unspecified organism: J18.9

## 2011-01-22 HISTORY — DX: Chronic obstructive pulmonary disease, unspecified: J44.9

## 2011-01-22 HISTORY — DX: Myoneural disorder, unspecified: G70.9

## 2011-01-22 HISTORY — DX: Headache: R51

## 2011-01-22 HISTORY — DX: Complex regional pain syndrome I of unspecified upper limb: G90.519

## 2011-01-22 HISTORY — DX: Depression, unspecified: F32.A

## 2011-01-22 LAB — CBC
MCH: 32.2 pg (ref 26.0–34.0)
Platelets: 263 10*3/uL (ref 150–400)
RBC: 4.01 MIL/uL (ref 3.87–5.11)
WBC: 6.5 10*3/uL (ref 4.0–10.5)

## 2011-01-22 LAB — SURGICAL PCR SCREEN
MRSA, PCR: NEGATIVE
Staphylococcus aureus: NEGATIVE

## 2011-01-22 MED ORDER — CHLORHEXIDINE GLUCONATE 4 % EX LIQD: 60.0000 mL | Freq: Once | CUTANEOUS | Status: DC

## 2011-01-22 NOTE — Pre-Procedure Instructions (Signed)
[name]  01/22/2011   Your procedure is scheduled on:  01-25-11 Thursday  Report to [facility] at 11:50 AM.  Call this number if you have problems the morning of surgery: [phone]   Remember:   Do not eat food:After Midnight.  Do not drink clear liquids: 4 Hours before arrival.  Take these medicines the morning of surgery with A SIP OF WATER: *prilosec, oxycontin, viibrid**   Do not wear jewelry, make-up or nail polish.  Do not wear lotions, powders, or perfumes. You may wear deodorant.  Do not shave 48 hours prior to surgery.  Do not bring valuables to the hospital.  Contacts, dentures or bridgework may not be worn into surgery.  Leave suitcase in the car. After surgery it may be brought to your room.  For patients admitted to the hospital, checkout time is 11:00 AM the day of discharge.   Patients discharged the day of surgery will not be allowed to drive home.  Name and phone number of your driver: nick manning - step-dad [phone]  Special Instructions: Incentive Spirometry - Practice and bring it with you on the day of surgery. and CHG Shower Use Special Wash: 1/2 bottle night before surgery and 1/2 bottle morning of surgery.   Please read over the following fact sheets that you were given: Pain Booklet, Coughing and Deep Breathing, MRSA Information and Surgical Site Infection Prevention

## 2011-01-24 MED ORDER — CEFAZOLIN SODIUM 1-5 GM-% IV SOLN
1.0000 g | Freq: Once | INTRAVENOUS | Status: AC
  Administered 2011-01-25: 1 g via INTRAVENOUS
  Filled 2011-01-24: qty 50

## 2011-01-25 ENCOUNTER — Encounter (HOSPITAL_COMMUNITY)
Admission: RE | Disposition: A | Discharge status: Home / Self Care | Payer: Self-pay | Source: Ambulatory Visit | Attending: Orthopedic Surgery

## 2011-01-25 ENCOUNTER — Encounter (HOSPITAL_COMMUNITY): Payer: Self-pay | Admitting: *Deleted

## 2011-01-25 ENCOUNTER — Ambulatory Visit (HOSPITAL_COMMUNITY)
Admission: RE | Admit: 2011-01-25 | Discharge: 2011-01-29 | Disposition: A | Discharge status: Home / Self Care | Payer: Worker's Compensation | Source: Ambulatory Visit | Attending: Orthopedic Surgery | Admitting: Orthopedic Surgery

## 2011-01-25 ENCOUNTER — Encounter (HOSPITAL_COMMUNITY): Payer: Self-pay | Admitting: Anesthesiology

## 2011-01-25 ENCOUNTER — Ambulatory Visit (HOSPITAL_COMMUNITY): Payer: Worker's Compensation

## 2011-01-25 ENCOUNTER — Ambulatory Visit (HOSPITAL_COMMUNITY): Payer: Worker's Compensation | Admitting: Anesthesiology

## 2011-01-25 DIAGNOSIS — G90519 Complex regional pain syndrome I of unspecified upper limb: Secondary | ICD-10-CM | POA: Insufficient documentation

## 2011-01-25 DIAGNOSIS — Z87891 Personal history of nicotine dependence: Secondary | ICD-10-CM | POA: Insufficient documentation

## 2011-01-25 DIAGNOSIS — M5126 Other intervertebral disc displacement, lumbar region: Secondary | ICD-10-CM | POA: Insufficient documentation

## 2011-01-25 DIAGNOSIS — M5116 Intervertebral disc disorders with radiculopathy, lumbar region: Secondary | ICD-10-CM | POA: Diagnosis present

## 2011-01-25 DIAGNOSIS — J449 Chronic obstructive pulmonary disease, unspecified: Secondary | ICD-10-CM | POA: Insufficient documentation

## 2011-01-25 DIAGNOSIS — G8929 Other chronic pain: Secondary | ICD-10-CM | POA: Insufficient documentation

## 2011-01-25 DIAGNOSIS — R51 Headache: Secondary | ICD-10-CM | POA: Insufficient documentation

## 2011-01-25 DIAGNOSIS — Z01812 Encounter for preprocedural laboratory examination: Secondary | ICD-10-CM | POA: Insufficient documentation

## 2011-01-25 DIAGNOSIS — J4489 Other specified chronic obstructive pulmonary disease: Secondary | ICD-10-CM | POA: Insufficient documentation

## 2011-01-25 HISTORY — PX: LUMBAR LAMINECTOMY/DECOMPRESSION MICRODISCECTOMY: SHX5026

## 2011-01-25 SURGERY — LUMBAR LAMINECTOMY/DECOMPRESSION MICRODISCECTOMY
Anesthesia: General | Site: Back | Laterality: Left

## 2011-01-25 MED ORDER — QUETIAPINE FUMARATE 100 MG PO TABS
100.0000 mg | ORAL_TABLET | Freq: Every day | ORAL | Status: DC
  Filled 2011-01-25 (×6): qty 1

## 2011-01-25 MED ORDER — MEPERIDINE HCL 25 MG/ML IJ SOLN: 6.2500 mg | INTRAMUSCULAR | Status: DC | PRN

## 2011-01-25 MED ORDER — PROPOFOL 10 MG/ML IV EMUL
INTRAVENOUS | Status: DC | PRN
  Administered 2011-01-25: 150 mg via INTRAVENOUS

## 2011-01-25 MED ORDER — DOCUSATE SODIUM 100 MG PO CAPS
100.0000 mg | ORAL_CAPSULE | Freq: Two times a day (BID) | ORAL | Status: DC
  Administered 2011-01-25 – 2011-01-29 (×8): 100 mg via ORAL
  Filled 2011-01-25 (×9): qty 1

## 2011-01-25 MED ORDER — FENTANYL CITRATE 0.05 MG/ML IJ SOLN
INTRAMUSCULAR | Status: DC | PRN
  Administered 2011-01-25: 150 ug via INTRAVENOUS
  Administered 2011-01-25: 50 ug via INTRAVENOUS

## 2011-01-25 MED ORDER — CEFAZOLIN SODIUM 1-5 GM-% IV SOLN
1.0000 g | Freq: Three times a day (TID) | INTRAVENOUS | Status: AC
  Administered 2011-01-25 – 2011-01-26 (×2): 1 g via INTRAVENOUS
  Filled 2011-01-25 (×2): qty 50

## 2011-01-25 MED ORDER — DEXAMETHASONE SODIUM PHOSPHATE 10 MG/ML IJ SOLN
10.0000 mg | Freq: Once | INTRAMUSCULAR | Status: AC
  Administered 2011-01-25: 10 mg via INTRAVENOUS
  Filled 2011-01-25: qty 1

## 2011-01-25 MED ORDER — ACETAMINOPHEN 10 MG/ML IV SOLN
1000.0000 mg | Freq: Four times a day (QID) | INTRAVENOUS | Status: AC
  Administered 2011-01-25 – 2011-01-26 (×4): 1000 mg via INTRAVENOUS
  Filled 2011-01-25 (×5): qty 100

## 2011-01-25 MED ORDER — DEXAMETHASONE SODIUM PHOSPHATE 4 MG/ML IJ SOLN
4.0000 mg | Freq: Four times a day (QID) | INTRAMUSCULAR | Status: DC
  Administered 2011-01-25: 4 mg via INTRAVENOUS
  Filled 2011-01-25 (×20): qty 1

## 2011-01-25 MED ORDER — THROMBIN 20000 UNITS EX KIT
PACK | OROMUCOSAL | Status: DC | PRN
  Administered 2011-01-25: 15:00:00 via TOPICAL

## 2011-01-25 MED ORDER — LACTATED RINGERS IV SOLN
INTRAVENOUS | Status: DC
  Administered 2011-01-25: 13:00:00 via INTRAVENOUS

## 2011-01-25 MED ORDER — MIDAZOLAM HCL 5 MG/5ML IJ SOLN
INTRAMUSCULAR | Status: DC | PRN
  Administered 2011-01-25: 2 mg via INTRAVENOUS

## 2011-01-25 MED ORDER — EPHEDRINE SULFATE 50 MG/ML IJ SOLN
INTRAMUSCULAR | Status: DC | PRN
  Administered 2011-01-25 (×2): 10 mg via INTRAVENOUS

## 2011-01-25 MED ORDER — MENTHOL 3 MG MT LOZG: 1.0000 | LOZENGE | OROMUCOSAL | Status: DC | PRN

## 2011-01-25 MED ORDER — SODIUM CHLORIDE 0.9 % IR SOLN
Status: DC | PRN
  Administered 2011-01-25: 1000 mL

## 2011-01-25 MED ORDER — METHOCARBAMOL 500 MG PO TABS
500.0000 mg | ORAL_TABLET | Freq: Four times a day (QID) | ORAL | Status: DC | PRN
  Administered 2011-01-25 – 2011-01-28 (×8): 500 mg via ORAL
  Filled 2011-01-25 (×9): qty 1

## 2011-01-25 MED ORDER — POLYETHYLENE GLYCOL 3350 17 G PO PACK
17.0000 g | PACK | Freq: Every day | ORAL | Status: DC | PRN
  Administered 2011-01-28: 17 g via ORAL
  Filled 2011-01-25: qty 1

## 2011-01-25 MED ORDER — ONDANSETRON HCL 4 MG/2ML IJ SOLN: 4.0000 mg | Freq: Once | INTRAMUSCULAR | Status: DC | PRN

## 2011-01-25 MED ORDER — ONDANSETRON HCL 4 MG/2ML IJ SOLN
INTRAMUSCULAR | Status: DC | PRN
  Administered 2011-01-25: 4 mg via INTRAVENOUS

## 2011-01-25 MED ORDER — ACETAMINOPHEN 10 MG/ML IV SOLN
INTRAVENOUS | Status: AC
  Filled 2011-01-25: qty 100

## 2011-01-25 MED ORDER — PHENOL 1.4 % MT LIQD
1.0000 | OROMUCOSAL | Status: DC | PRN
  Filled 2011-01-25: qty 177

## 2011-01-25 MED ORDER — HYDROMORPHONE HCL PF 1 MG/ML IJ SOLN: 0.2500 mg | INTRAMUSCULAR | Status: DC | PRN

## 2011-01-25 MED ORDER — PANTOPRAZOLE SODIUM 40 MG PO TBEC
40.0000 mg | DELAYED_RELEASE_TABLET | Freq: Every day | ORAL | Status: DC
  Administered 2011-01-25 – 2011-01-29 (×4): 40 mg via ORAL
  Filled 2011-01-25 (×3): qty 1

## 2011-01-25 MED ORDER — ACETAMINOPHEN 10 MG/ML IV SOLN
1000.0000 mg | Freq: Once | INTRAVENOUS | Status: AC
  Administered 2011-01-25: 1000 mg via INTRAVENOUS

## 2011-01-25 MED ORDER — SODIUM CHLORIDE 0.9 % IJ SOLN
3.0000 mL | Freq: Two times a day (BID) | INTRAMUSCULAR | Status: DC
  Administered 2011-01-25 – 2011-01-28 (×8): 3 mL via INTRAVENOUS

## 2011-01-25 MED ORDER — MAGNESIUM HYDROXIDE 400 MG/5ML PO SUSP: 30.0000 mL | Freq: Two times a day (BID) | ORAL | Status: DC | PRN

## 2011-01-25 MED ORDER — SODIUM CHLORIDE 0.9 % IV SOLN: 250.0000 mL | INTRAVENOUS | Status: DC

## 2011-01-25 MED ORDER — HEMOSTATIC AGENTS (NO CHARGE) OPTIME
TOPICAL | Status: DC | PRN
  Administered 2011-01-25: 1 via TOPICAL

## 2011-01-25 MED ORDER — DEXAMETHASONE 4 MG PO TABS
4.0000 mg | ORAL_TABLET | Freq: Four times a day (QID) | ORAL | Status: DC
  Administered 2011-01-26 – 2011-01-29 (×15): 4 mg via ORAL
  Filled 2011-01-25 (×21): qty 1

## 2011-01-25 MED ORDER — ROCURONIUM BROMIDE 100 MG/10ML IV SOLN
INTRAVENOUS | Status: DC | PRN
  Administered 2011-01-25: 50 mg via INTRAVENOUS

## 2011-01-25 MED ORDER — SODIUM CHLORIDE 0.9 % IJ SOLN: 3.0000 mL | INTRAMUSCULAR | Status: DC | PRN

## 2011-01-25 MED ORDER — METHOCARBAMOL 100 MG/ML IJ SOLN
500.0000 mg | Freq: Four times a day (QID) | INTRAVENOUS | Status: DC | PRN
  Filled 2011-01-25: qty 5

## 2011-01-25 MED ORDER — ZOLPIDEM TARTRATE 10 MG PO TABS
10.0000 mg | ORAL_TABLET | Freq: Every evening | ORAL | Status: DC | PRN
  Administered 2011-01-25 – 2011-01-28 (×4): 10 mg via ORAL
  Filled 2011-01-25 (×4): qty 1

## 2011-01-25 MED ORDER — MORPHINE SULFATE 2 MG/ML IJ SOLN
1.0000 mg | INTRAMUSCULAR | Status: DC | PRN
  Administered 2011-01-25 (×2): 4 mg via INTRAVENOUS
  Filled 2011-01-25: qty 2

## 2011-01-25 MED ORDER — ONDANSETRON HCL 4 MG/2ML IJ SOLN: 4.0000 mg | INTRAMUSCULAR | Status: DC | PRN

## 2011-01-25 MED ORDER — MODAFINIL 200 MG PO TABS
200.0000 mg | ORAL_TABLET | Freq: Two times a day (BID) | ORAL | Status: DC
  Administered 2011-01-26 – 2011-01-29 (×2): 200 mg via ORAL
  Filled 2011-01-25 (×4): qty 1

## 2011-01-25 MED ORDER — LACTATED RINGERS IV SOLN
INTRAVENOUS | Status: DC | PRN
  Administered 2011-01-25 (×2): via INTRAVENOUS

## 2011-01-25 MED ORDER — VILAZODONE HCL 40 MG PO TABS
40.0000 mg | ORAL_TABLET | Freq: Every day | ORAL | Status: DC
  Administered 2011-01-26 – 2011-01-29 (×4): 40 mg via ORAL
  Filled 2011-01-25 (×6): qty 1

## 2011-01-25 MED ORDER — OXYCODONE HCL 5 MG PO TABS
10.0000 mg | ORAL_TABLET | ORAL | Status: DC | PRN
  Administered 2011-01-25 – 2011-01-29 (×18): 10 mg via ORAL
  Filled 2011-01-25 (×11): qty 2
  Filled 2011-01-25: qty 1
  Filled 2011-01-25 (×5): qty 2
  Filled 2011-01-25: qty 1

## 2011-01-25 MED ORDER — BUPIVACAINE-EPINEPHRINE 0.25% -1:200000 IJ SOLN
INTRAMUSCULAR | Status: DC | PRN
  Administered 2011-01-25: 10 mL

## 2011-01-25 SURGICAL SUPPLY — 61 items
ADH SKN CLS APL DERMABOND .7 (GAUZE/BANDAGES/DRESSINGS) ×1
BUR EGG ELITE 4.0 (BURR) IMPLANT
BUR MATCHSTICK NEURO 3.0 LAGG (BURR) ×1 IMPLANT
CANISTER SUCTION 2500CC (MISCELLANEOUS) ×2 IMPLANT
CLOSURE STERI STRIP 1/2 X4 (GAUZE/BANDAGES/DRESSINGS) ×1 IMPLANT
CLOTH BEACON ORANGE TIMEOUT ST (SAFETY) ×2 IMPLANT
CORDS BIPOLAR (ELECTRODE) ×2 IMPLANT
COVER SURGICAL LIGHT HANDLE (MISCELLANEOUS) ×2 IMPLANT
DERMABOND ADVANCED (GAUZE/BANDAGES/DRESSINGS) ×1
DERMABOND ADVANCED .7 DNX12 (GAUZE/BANDAGES/DRESSINGS) ×1 IMPLANT
DRAIN CHANNEL 15F RND FF W/TCR (WOUND CARE) ×2 IMPLANT
DRAPE POUCH INSTRU U-SHP 10X18 (DRAPES) ×2 IMPLANT
DRAPE SURG 17X23 STRL (DRAPES) ×2 IMPLANT
DRAPE U-SHAPE 47X51 STRL (DRAPES) ×2 IMPLANT
DRSG MEPILEX BORDER 4X4 (GAUZE/BANDAGES/DRESSINGS) ×1 IMPLANT
DRSG MEPILEX BORDER 4X8 (GAUZE/BANDAGES/DRESSINGS) ×2 IMPLANT
DURAPREP 26ML APPLICATOR (WOUND CARE) ×2 IMPLANT
ELECT BLADE 4.0 EZ CLEAN MEGAD (MISCELLANEOUS) ×2
ELECT CAUTERY BLADE 6.4 (BLADE) ×2 IMPLANT
ELECT REM PT RETURN 9FT ADLT (ELECTROSURGICAL) ×2
ELECTRODE BLDE 4.0 EZ CLN MEGD (MISCELLANEOUS) IMPLANT
ELECTRODE REM PT RTRN 9FT ADLT (ELECTROSURGICAL) ×1 IMPLANT
EVACUATOR 1/8 PVC DRAIN (DRAIN) IMPLANT
EVACUATOR SILICONE 100CC (DRAIN) ×2 IMPLANT
GLOVE BIOGEL PI IND STRL 6.5 (GLOVE) ×1 IMPLANT
GLOVE BIOGEL PI IND STRL 8.5 (GLOVE) ×1 IMPLANT
GLOVE BIOGEL PI INDICATOR 6.5 (GLOVE) ×4
GLOVE BIOGEL PI INDICATOR 8.5 (GLOVE) ×1
GLOVE ECLIPSE 6.0 STRL STRAW (GLOVE) ×2 IMPLANT
GLOVE ECLIPSE 8.5 STRL (GLOVE) ×2 IMPLANT
GLOVE SURG SS PI 6.5 STRL IVOR (GLOVE) ×2 IMPLANT
GOWN PREVENTION PLUS XXLARGE (GOWN DISPOSABLE) ×2 IMPLANT
GOWN STRL NON-REIN LRG LVL3 (GOWN DISPOSABLE) ×5 IMPLANT
KIT BASIN OR (CUSTOM PROCEDURE TRAY) ×2 IMPLANT
KIT ROOM TURNOVER OR (KITS) ×2 IMPLANT
MANIFOLD NEPTUNE WASTE (CANNULA) ×1 IMPLANT
NDL SPNL 18GX3.5 QUINCKE PK (NEEDLE) ×2 IMPLANT
NEEDLE 22X1 1/2 (OR ONLY) (NEEDLE) ×2 IMPLANT
NEEDLE SPNL 18GX3.5 QUINCKE PK (NEEDLE) ×4 IMPLANT
NS IRRIG 1000ML POUR BTL (IV SOLUTION) ×2 IMPLANT
PACK LAMINECTOMY ORTHO (CUSTOM PROCEDURE TRAY) ×2 IMPLANT
PACK UNIVERSAL I (CUSTOM PROCEDURE TRAY) ×2 IMPLANT
PAD ARMBOARD 7.5X6 YLW CONV (MISCELLANEOUS) ×2 IMPLANT
PATTIES SURGICAL .5 X.5 (GAUZE/BANDAGES/DRESSINGS) ×1 IMPLANT
PATTIES SURGICAL .5 X1 (DISPOSABLE) ×1 IMPLANT
SPONGE SURGIFOAM ABS GEL 100 (HEMOSTASIS) ×1 IMPLANT
STRIP CLOSURE SKIN 1/2X4 (GAUZE/BANDAGES/DRESSINGS) ×2 IMPLANT
SURGIFLO TRUKIT (HEMOSTASIS) ×1 IMPLANT
SUT BONE WAX W31G (SUTURE) ×1 IMPLANT
SUT MNCRL AB 3-0 PS2 18 (SUTURE) ×2 IMPLANT
SUT VIC AB 0 CT1 27 (SUTURE) ×2
SUT VIC AB 0 CT1 27XBRD ANBCTR (SUTURE) ×1 IMPLANT
SUT VIC AB 1 CTX 36 (SUTURE) ×4
SUT VIC AB 1 CTX36XBRD ANBCTR (SUTURE) ×2 IMPLANT
SUT VIC AB 2-0 CT1 18 (SUTURE) ×2 IMPLANT
SYR BULB IRRIGATION 50ML (SYRINGE) ×2 IMPLANT
SYR CONTROL 10ML LL (SYRINGE) ×2 IMPLANT
TOWEL OR 17X24 6PK STRL BLUE (TOWEL DISPOSABLE) ×2 IMPLANT
TOWEL OR 17X26 10 PK STRL BLUE (TOWEL DISPOSABLE) ×2 IMPLANT
WATER STERILE IRR 1000ML POUR (IV SOLUTION) ×1 IMPLANT
YANKAUER SUCT BULB TIP NO VENT (SUCTIONS) ×2 IMPLANT

## 2011-01-25 NOTE — H&P (Signed)
  No change in clinical exam Patient re-examined - no changes See office note for detailed, up to date H+P

## 2011-01-25 NOTE — Brief Op Note (Signed)
01/25/2011  4:37 PM  PATIENT:  Tara [name]  45 y.o. female  PRE-OPERATIVE DIAGNOSIS:  L5-S1 DISC-HERNIATION  POST-OPERATIVE DIAGNOSIS:  L5-S1 Disc Herniation  PROCEDURE:  Procedure(s): LUMBAR LAMINECTOMY/DECOMPRESSION MICRODISCECTOMY  SURGEON:  Surgeon(s): Lexander Tremblay Sheela Stack  PHYSICIAN ASSISTANT: Norval Gable  ASSISTANTS: none   ANESTHESIA:   general  EBL:  Total I/O In: 1000 [I.V.:1000] Out: 125 [Blood:125]  BLOOD ADMINISTERED:none  DRAINS: none   LOCAL MEDICATIONS USED:  MARCAINE 10 CC  SPECIMEN:  No Specimen  DISPOSITION OF SPECIMEN:  NA  COUNTS:  YES  TOURNIQUET:  * No tourniquets in log *  DICTATION: .Dragon Dictation  PLAN OF CARE: Admit for overnight observation  PATIENT DISPOSITION:  PACU - hemodynamically stable.   Delay start of Pharmacological VTE agent (>24hrs) due to surgical blood loss or risk of bleeding:  {YES/NO/NOT APPLICABLE:20182

## 2011-01-25 NOTE — Anesthesia Postprocedure Evaluation (Signed)
  Anesthesia Post-op Note  Patient: Tara [name]  Procedure(s) Performed:  LUMBAR LAMINECTOMY/DECOMPRESSION MICRODISCECTOMY - LEFT L5-S1 MICRODISCECTOMY, Central Decompression Lumbar five-sacral one  Patient Location: PACU  Anesthesia Type: General  Level of Consciousness: oriented  Airway and Oxygen Therapy: Patient connected to nasal cannula oxygen  Post-op Pain: none  Post-op Assessment: Post-op Vital signs reviewed  Post-op Vital Signs: stable  Complications: No apparent anesthesia complications

## 2011-01-25 NOTE — Preoperative (Signed)
Beta Blockers   Reason not to administer Beta Blockers:Not Applicable 

## 2011-01-25 NOTE — Op Note (Signed)
OPERATIVE REPORT  DATE OF SURGERY: 01/25/2011  PATIENT NAME:  Tara [name] MRN: [id number] DOB: 05/05/1965  PCP: Tommy Rainwater, MD  PRE-OPERATIVE DIAGNOSIS:  Disc herniation L5/S1  POST-OPERATIVE DIAGNOSIS:  Calcified hard disc osteophyte L5-S1  PROCEDURE:   05 S1 central decompression with partial resection of L5-S1 hard disc osteophyte (exostosis)  SURGEON:  Venita Lick, MD  PHYSICIAN ASSISTANT: Norval Gable   ANESTHESIA:   General  EBL: 125. ml   BRIEF HISTORY: Tara [name] is a 45 y.o. female who presented to my office with complaints of severe debilitating left leg pain. CT myelogram demonstrated a large disc osteophyte and causing compression in the left lateral recess at L5-S1. Despite appropriate conservative management her pain continued. She also noted preoperative weakness in the left leg consistent with the S1 radiculopathy. As a result of the failure of conservative management she elected to proceed with surgery. All appropriate risks benefits and alternatives were discussed with the patient and consent was obtained.  PROCEDURE DETAILS: Patient was brought into the operating room. After successful induction of general anesthesia and tracheal intubation a Time Out was done. This confirmed all pertinent important data. Patient was turned prone onto the Wilson frame. All bony prominences were well padded and the back was prepped and draped in a standard fashion.  2 needles were placed into the back preoperative x-ray was taken to confirm her incision site. Once I confirmed the L5-S1 level I infiltrated the skin incision with Marcaine with quarter percent the and then made a inch and half incision centered over the L5-S1 disc sharp dissection was carried out down to the deep fascia. The deep fascia was sharply incised with a Bovie and I used a Cobb elevator to dissect paraspinal muscles off the left side of the L5-S1 spinous process  and lamina.  I then placed a  Penfield 4 underneath the L5 lamina confirmed that the appropriate level.  Once I confirmed that I was at the L5-S1 disc space level I used a 2 and 3 mm Kerrison rongeur to perform a generous laminotomy of L5. I then release the annulus from the leading edge the S1 lamina and remove the ligamentum flavum to expose the underlying thecal sac. His at this time then noted significant epidural veins. They were very robust. I used bipolar electrocautery to obtain hemostasis and resect the epidural veins. I continued my decompression and dissection at the lateral recess removing the omentum flavum as well as overhanging osteophyte. I eventually identified the S1 nerve root and protected it. Then gently began retracting the thecal sac. At this point after coagulating epidural veins in the lateral recess I noted significant calcified central osteophyte. I incised the L5-S1 disc space and began using Epstein curettes to mobilize this calcified disc. These also used micropituitary rongeurs. In addition nerve hook was used to attempt to break up some of the osteophytes were resected  His at this point since the central region was still where the osteophyte was at its greatest that I elected to do a complete central decompression to ensure that there was adequate space for the thecal sac.  I then removed at this point I dissected on the right-hand side in similar fashion and then removed the majority of the L5 spinous process and a portion of that of S1. I then performed a generous laminotomy on the right side to complete central decompression at L5-S1. I then swept into the lateral recess again noting same significant leash of epidural veins. I coagulated  with bipolar electrocautery and then identified the disc space. Once again I attempted to use a reverse curettes (Epstein) to break up this osteophyte and resected.  After significant time past and had an adequate that central decompression I then checked the lateral  recess with a [facility] elevator there was no pressure on the S1 or exiting L5 nerve root. I could clearly U. easily pass the [facility] elevator in the lateral recess down past the S1 pedicle up to the L5 pedicle and out the foramen at that level XII and even though there was a central osteophyte he was not as significant as was the start of the case and I was still able to palpate around I also is able to same evaluation of decompression on the contralateral side.  At this point I irrigated the wound copiously with normal saline and then used FloSeal to obtain hemostasis. After I irrigated out the FloSeal and then checked again with the bipolar cautery confirm that I had hemostasis. I then closed the deep fascia with interrupted #1 Vicryl suture superficial to avoid suture of 3-0 Monocryl for the skin. Steri-Strips dry dressing applied the patient was ultimately extubated transferred to the PACU then inserted. The first assistant Norval Gable was instrumental in assisting with traction, suction, and wound closure  Venita Lick, MD 01/25/2011 4:28 PM

## 2011-01-25 NOTE — Anesthesia Preprocedure Evaluation (Addendum)
Anesthesia Evaluation  Patient identified by MRN, date of birth, ID band Patient awake    Airway       Dental   Pulmonary pneumonia , COPDformer smoker         Cardiovascular     Neuro/Psych  Headaches,    GI/Hepatic   Endo/Other    Renal/GU      Musculoskeletal   Abdominal   Peds  Hematology   Anesthesia Other Findings   Reproductive/Obstetrics                           Anesthesia Physical Anesthesia Plan  ASA: III  Anesthesia Plan: General   Post-op Pain Management:    Induction: Intravenous  Airway Management Planned: Oral ETT  Additional Equipment:   Intra-op Plan:   Post-operative Plan: Extubation in OR  Informed Consent: I have reviewed the patients History and Physical, chart, labs and discussed the procedure including the risks, benefits and alternatives for the proposed anesthesia with the patient or authorized representative who has indicated his/her understanding and acceptance.   Dental advisory given  Plan Discussed with:   Anesthesia Plan Comments:         Anesthesia Quick Evaluation

## 2011-01-25 NOTE — Anesthesia Procedure Notes (Signed)
Procedure Name: Intubation Date/Time: 01/25/2011 1:51 PM Performed by: Jefm Miles E Pre-anesthesia Checklist: Patient identified, Emergency Drugs available, Suction available, Patient being monitored and Timeout performed Patient Re-evaluated:Patient Re-evaluated prior to inductionOxygen Delivery Method: Circle System Utilized Preoxygenation: Pre-oxygenation with 100% oxygen Intubation Type: IV induction Ventilation: Mask ventilation without difficulty Laryngoscope Size: Mac and 4 Grade View: Grade I Tube type: Oral Tube size: 7.5 mm Number of attempts: 1 Airway Equipment and Method: stylet Placement Confirmation: ETT inserted through vocal cords under direct vision,  breath sounds checked- equal and bilateral,  positive ETCO2 and CO2 detector Secured at: 22 cm Tube secured with: Tape Dental Injury: Teeth and Oropharynx as per pre-operative assessment

## 2011-01-25 NOTE — Transfer of Care (Signed)
Immediate Anesthesia Transfer of Care Note  Patient: Tara [name]  Procedure(s) Performed:  LUMBAR LAMINECTOMY/DECOMPRESSION MICRODISCECTOMY - LEFT L5-S1 MICRODISCECTOMY, Central Decompression Lumbar five-sacral one  Patient Location: PACU  Anesthesia Type: General  Level of Consciousness: alert  and oriented  Airway & Oxygen Therapy: Patient Spontanous Breathing and Patient connected to nasal cannula oxygen  Post-op Assessment: Report given to PACU RN, Post -op Vital signs reviewed and stable and Patient moving all extremities  Post vital signs: Reviewed and stable  Complications: No apparent anesthesia complications

## 2011-01-25 NOTE — Progress Notes (Signed)
Pt admitted from PACU for lumbar laminectomy. Rept received from [facility] PACU RN.  She is alert and oriented x 3. Vital signs stable. Pt c/o numbness of L leg. Per PACU nurse rept, Dr. Shon Baton aware. No new orders except for continue to monitor. Pt is to void after surgery. Pt has PAS and TEDs on. Neuro check is otherwise negative.

## 2011-01-26 MED ORDER — ONDANSETRON HCL 4 MG PO TABS: 4.0000 mg | ORAL_TABLET | Freq: Three times a day (TID) | ORAL | Status: DC | PRN

## 2011-01-26 MED ORDER — OXYCODONE HCL 15 MG PO TB12
30.0000 mg | ORAL_TABLET | Freq: Two times a day (BID) | ORAL | Status: DC
  Administered 2011-01-26 – 2011-01-29 (×7): 30 mg via ORAL
  Filled 2011-01-26 (×2): qty 2
  Filled 2011-01-26: qty 1
  Filled 2011-01-26: qty 2
  Filled 2011-01-26: qty 1
  Filled 2011-01-26: qty 2
  Filled 2011-01-26 (×2): qty 1
  Filled 2011-01-26: qty 2

## 2011-01-26 MED ORDER — METHOCARBAMOL 500 MG PO TABS: 500.0000 mg | ORAL_TABLET | Freq: Three times a day (TID) | ORAL | Status: DC

## 2011-01-26 MED ORDER — MORPHINE SULFATE 4 MG/ML IJ SOLN
1.0000 mg | INTRAMUSCULAR | Status: DC | PRN
  Administered 2011-01-26 (×3): 4 mg via INTRAVENOUS
  Filled 2011-01-26 (×3): qty 1

## 2011-01-26 MED ORDER — POLYETHYLENE GLYCOL 3350 17 G PO PACK: 17.0000 g | PACK | Freq: Every day | ORAL | Status: AC

## 2011-01-26 MED ORDER — CLONAZEPAM 1 MG PO TABS
1.0000 mg | ORAL_TABLET | Freq: Two times a day (BID) | ORAL | Status: DC | PRN
  Administered 2011-01-26 – 2011-01-28 (×5): 1 mg via ORAL
  Filled 2011-01-26 (×6): qty 1

## 2011-01-26 NOTE — Progress Notes (Signed)
Utilization review completed. Contessa Preuss, RN, BSN. 01/26/11 

## 2011-01-26 NOTE — Progress Notes (Signed)
Subjective: Procedure(s) (LRB): LUMBAR LAMINECTOMY/DECOMPRESSION MICRODISCECTOMY (Left) 1 Day Post-Op  Patient reports pain as 7 on 0-10 scale.  Reports none leg pain reports incisional back pain   Positive void Negative bowel movement Positive flatus Negative chest pain or shortness of breath  Objective: Vital signs in last 24 hours: Temp:  [97.1 F (36.2 C)-98.3 F (36.8 C)] 98.3 F (36.8 C) (11/16 0602) Pulse Rate:  [74-99] 84  (11/16 0602) Resp:  [13-18] 18  (11/16 0602) BP: (92-112)/(40-68) 92/56 mmHg (11/16 0602) SpO2:  [96 %-100 %] 100 % (11/16 0602) FiO2 (%):  [2 %] 2 % (11/15 1820)  Intake/Output from previous day: 11/15 0701 - 11/16 0700 In: 1350 [I.V.:1350] Out: 575 [Urine:450; Blood:125]  No results found for this basename: WBC:2,RBC:2,HCT:2,PLT:2 in the last 72 hours No results found for this basename: NA:2,K:2,CL:2,CO2:2,BUN:2,CREATININE:2,GLUCOSE:2,CALCIUM:2 in the last 72 hours No results found for this basename: LABPT:2,INR:2 in the last 72 hours  ABD soft Neurovascular intact Sensation intact distally Intact pulses distally Dorsiflexion/Plantar flexion intact Incision: dressing C/D/I Compartment soft  Assessment/Plan: Patient stable  Continue care per spine protocol Up with therapy Plan for discharge tomorrow  Tara [name] 01/26/2011, 8:01 AM

## 2011-01-26 NOTE — Progress Notes (Signed)
Physical Therapy Evaluation Patient Details Name: Tara [name] MRN: [id number] DOB: 22-Jun-1965 Today's Date: 01/26/2011  Problem List:  Patient Active Problem List  Diagnoses  . Chronic pain  . Lumbar disc herniation with radiculopathy    Past Medical History:  Past Medical History  Diagnosis Date  . CPRS 1 (complex regional pain syndrome I) of upper limb 2007    formerly called rsd  . COPD (chronic obstructive pulmonary disease)     uses spiriva  . Pneumonia     2011 - hospitalized for 4 days  . Headache     headaches due to pain  . Neuromuscular disorder     rsd  . Arthritis     arthritis in back  . Depression     being treated for depression   Past Surgical History:  Past Surgical History  Procedure Date  . Abdominal hysterectomy 2005    partial  . Tubal ligation 1988  . Cervical disc arthroplasty 2008  . Right knee arthroscopy 2008  . Spinal cord stimulator implant 2009  . Removal left ovary 2011    PT Assessment/Plan/Recommendation PT Assessment Clinical Impression Statement: Pt. is pleasant 45 year old lady s/p lumbar surgery who presents with deficits in functional mobility and gait.  She needs acute PT to maximize mobility and gait for DC home alone. PT Recommendation/Assessment: Patient will need skilled PT in the acute care venue PT Problem List: Decreased activity tolerance;Decreased balance;Decreased mobility;Decreased knowledge of use of DME;Decreased knowledge of precautions Barriers to Discharge: None PT Therapy Diagnosis : Difficulty walking;Acute pain PT Plan PT Frequency: Min 6X/week PT Treatment/Interventions: DME instruction;Gait training;Stair training;Functional mobility training;Balance training;Patient/family education PT Recommendation Follow Up Recommendations: Home health PT Equipment Recommended: Rolling walker with 5" wheels;Other (comment) PT Goals  Acute Rehab PT Goals PT Goal Formulation: With patient Time For Goal  Achievement: 7 days Pt will go Supine/Side to Sit: with modified independence PT Goal: Supine/Side to Sit - Progress: Progressing toward goal Pt will go Sit to Supine/Side: with modified independence PT Goal: Sit to Supine/Side - Progress:  (not tested) Pt will Ambulate: 51 - 150 feet;with least restrictive assistive device PT Goal: Ambulate - Progress: Progressing toward goal Pt will Go Up / Down Stairs: 1-2 stairs;with supervision PT Goal: Up/Down Stairs - Progress:  (not tested) Additional Goals Additional Goal #1: Pt. will state and observe 3/3 back precautions consistently PT Goal: Additional Goal #1 - Progress: Progressing toward goal  PT Evaluation Precautions/Restrictions  Precautions Precautions: Back Precaution Booklet Issued: Yes (comment) (back handout) Restrictions Weight Bearing Restrictions: No Prior Functioning  Home Living Lives With: Alone Receives Help From: Family Type of Home: Apartment Home Layout: One level Home Access: Stairs to enter Entrance Stairs-Rails: None Entrance Stairs-Number of Steps: 1 Bathroom Shower/Tub: Electrical engineer Equipment: None Prior Function Level of Independence: Independent with basic ADLs;Independent with gait Able to Take Stairs?: No Driving: Yes Vocation: On disability Leisure: Hobbies-yes (Comment) Comments: Volunteer work as a Comptroller. Cognition Cognition Arousal/Alertness: Awake/alert Overall Cognitive Status: Appears within functional limits for tasks assessed Orientation Level: Oriented X4 Sensation/Coordination Sensation Additional Comments: reports feeling of numbness left foot Extremity Assessment RUE Assessment RUE Assessment: Within Functional Limits LUE Assessment LUE Assessment: Within Functional Limits RLE Assessment RLE Assessment: Within Functional Limits LLE Assessment LLE Assessment: Not tested Mobility (including Balance) Bed Mobility Bed Mobility:  Yes Rolling Left: 5: Supervision Rolling Left Details (indicate cue type and reason): vc's for log rolling technique Left Sidelying to Sit:  3: Mod assist Left Sidelying to Sit Details (indicate cue type and reason): assist needed for shoulders to achieve upright Sit to Supine - Left: Not tested (comment) Transfers Transfers: Yes Sit to Stand: 5: Supervision;From bed;From chair/3-in-1 Sit to Stand Details (indicate cue type and reason): vc's for hand placement and safe technique Stand to Sit: 5: Supervision;To chair/3-in-1 Stand to Sit Details: instructions for hand placement Ambulation/Gait Ambulation/Gait: Yes Ambulation/Gait Assistance: 5: Supervision Ambulation Distance (Feet): 60 Feet Assistive device: Rolling walker Gait Pattern: Within Functional Limits Stairs: No  Posture/Postural Control Posture/Postural Control: No significant limitations Balance Balance Assessed: Yes Static Sitting Balance Static Sitting - Level of Assistance: 7: Independent Static Standing Balance Static Standing - Level of Assistance: 5: Stand by assistance Exercise    End of Session PT - End of Session Activity Tolerance: Patient tolerated treatment well Patient left: in chair (with instructions to sit up <1 hour) Nurse Communication: Other (comment) (posted back precautions) General Behavior During Session: [facility] for tasks performed Cognition: [facility] for tasks performed  Ferman Hamming 01/26/2011, 3:10 PM Acute Rehabilitation Services [phone] (pager)

## 2011-01-26 NOTE — Progress Notes (Signed)
Occupational Therapy Evaluation Patient Details Name: Tara [name] MRN: [id number] DOB: [birth year] Today's Date: 01/26/2011  Problem List:  Patient Active Problem List  Diagnoses  . Chronic pain  . Lumbar disc herniation with radiculopathy    Past Medical History:  Past Medical History  Diagnosis Date  . CPRS 1 (complex regional pain syndrome I) of upper limb 2007    formerly called rsd  . COPD (chronic obstructive pulmonary disease)     uses spiriva  . Pneumonia     2011 - hospitalized for 4 days  . Headache     headaches due to pain  . Neuromuscular disorder     rsd  . Arthritis     arthritis in back  . Depression     being treated for depression   Past Surgical History:  Past Surgical History  Procedure Date  . Abdominal hysterectomy 2005    partial  . Tubal ligation 1988  . Cervical disc arthroplasty 2008  . Right knee arthroscopy 2008  . Spinal cord stimulator implant 2009  . Removal left ovary 2011    OT Assessment/Plan/Recommendation OT Assessment Clinical Impression Statement: Pt will benefit from OT services in the acute care setting to increase I with ADLs and to increase I with functional transfers while maintaining back safety precautions in prep for d/c home. OT Recommendation/Assessment: Patient will need skilled OT in the acute care venue OT Problem List: Decreased activity tolerance;Decreased knowledge of use of DME or AE;Decreased knowledge of precautions;Pain OT Therapy Diagnosis : Acute pain OT Plan OT Frequency: Min 2X/week OT Treatment/Interventions: Self-care/ADL training;DME and/or AE instruction;Therapeutic activities;Patient/family education OT Recommendation Follow Up Recommendations: Home health OT Equipment Recommended: 3 in 1 bedside comode Individuals Consulted Consulted and Agree with Results and Recommendations: Patient OT Goals Acute Rehab OT Goals OT Goal Formulation: With patient Time For Goal Achievement: 7 days ADL  Goals Pt Will Perform Grooming: with modified independence;Standing at sink;Other (comment) (while maintaining back safety precautions) ADL Goal: Grooming - Progress: Other (comment) Pt Will Perform Lower Body Bathing: with modified independence;Sit to stand from bed;Other (comment) (while maintaining back safety precautions) ADL Goal: Lower Body Bathing - Progress: Other (comment) Pt Will Perform Lower Body Dressing: with modified independence;Sit to stand from bed;Other (comment) (while maintaining back safety precautions) ADL Goal: Lower Body Dressing - Progress: Other (comment) Pt Will Transfer to Toilet: with modified independence;Ambulation;with DME;3-in-1;Maintaining back safety precautions ADL Goal: Toilet Transfer - Progress: Other (comment) Pt Will Perform Toileting - Clothing Manipulation: with modified independence;Standing;Other (comment) (while maintaining back safety precautions) ADL Goal: Toileting - Clothing Manipulation - Progress: Other (comment) Pt Will Perform Toileting - Hygiene: with modified independence;Sit to stand from 3-in-1/toilet;Other (comment) (while maintaining back safety precautions) ADL Goal: Toileting - Hygiene - Progress: Other (comment) Additional ADL Goal #1: Pt will be able to state and demonstrate 3/3 back safety precautions 100% of the time during ADLs and functional transfers. ADL Goal: Additional Goal #1 - Progress: Other (comment)  OT Evaluation Precautions/Restrictions  Precautions Precautions: Back Precaution Booklet Issued: Yes (comment) (back handout) Restrictions Weight Bearing Restrictions: No Prior Functioning Home Living Lives With: Alone Receives Help From: Family Type of Home: Apartment Home Layout: One level Home Access: Stairs to enter Entrance Stairs-Rails: None Entrance Stairs-Number of Steps: 1 Bathroom Shower/Tub: Electrical engineer Equipment: None Prior Function Level of  Independence: Independent with basic ADLs;Independent with gait Able to Take Stairs?: No Driving: Yes Vocation: On disability Leisure: Hobbies-yes (Comment) Comments: Volunteer work  as a Comptroller. ADL ADL Eating/Feeding: Simulated;Independent Where Assessed - Eating/Feeding: Chair Grooming: Simulated;Supervision/safety Grooming Details (indicate cue type and reason): Supervision for maintaining back safey precautions. Where Assessed - Grooming: Standing at sink Upper Body Bathing: Simulated;Supervision/safety Upper Body Bathing Details (indicate cue type and reason): Supervision for maintaining back safety precautions. Where Assessed - Upper Body Bathing: Sitting, bed Lower Body Bathing: Simulated;Supervision/safety Lower Body Bathing Details (indicate cue type and reason): Supervision for maintaining back safety precautions. Where Assessed - Lower Body Bathing: Sit to stand from bed Upper Body Dressing: Simulated;Supervision/safety Upper Body Dressing Details (indicate cue type and reason): Supervision for maintaining back safety precautions. Where Assessed - Upper Body Dressing: Sitting, bed Lower Body Dressing: Simulated;Supervision/safety Lower Body Dressing Details (indicate cue type and reason): Supervision for maintaining back safety precautions. Where Assessed - Lower Body Dressing: Sit to stand from bed Toilet Transfer: Performed;Supervision/safety Toilet Transfer Details (indicate cue type and reason): Supervision for safety and maintaining back safety precautions. Toilet Transfer Method: Proofreader: Set designer - Clothing Manipulation: Performed;Supervision/safety Where Assessed - Glass blower/designer Manipulation: Standing Toileting - Hygiene: Performed;Supervision/safety Toileting - Hygiene Details (indicate cue type and reason): Supervision for maintaining back safety precautions. Where Assessed - Toileting Hygiene: Sit to stand from  3-in-1 or toilet Tub/Shower Transfer: Not assessed Tub/Shower Transfer Method: Not assessed Equipment Used: Rolling walker Vision/Perception  Vision - History Baseline Vision: Wears glasses only for reading Cognition Cognition Arousal/Alertness: Awake/alert Overall Cognitive Status: Appears within functional limits for tasks assessed Orientation Level: Oriented X4 Sensation/Coordination Sensation Additional Comments: reports feeling of numbness left foot Extremity Assessment RUE Assessment RUE Assessment: Within Functional Limits LUE Assessment LUE Assessment: Within Functional Limits Mobility  Bed Mobility Bed Mobility: Yes Rolling Left: 5: Supervision Rolling Left Details (indicate cue type and reason): vc's for log rolling technique Left Sidelying to Sit: 3: Mod assist Left Sidelying to Sit Details (indicate cue type and reason): assist needed for shoulders to achieve upright Sit to Supine - Left: Not tested (comment) Transfers Transfers: Yes Sit to Stand: 5: Supervision;From bed;From chair/3-in-1 Sit to Stand Details (indicate cue type and reason): vc's for hand placement and safe technique Stand to Sit: 5: Supervision;To chair/3-in-1 Stand to Sit Details: instructions for hand placement Exercises   End of Session OT - End of Session Equipment Utilized During Treatment: Gait belt Activity Tolerance: Patient limited by pain Patient left: in chair;with call bell in reach General Behavior During Session: [facility] for tasks performed Cognition: [facility] for tasks performed   Cipriano Mile 01/26/2011, 3:04 PM  01/26/2011 Cipriano Mile OTR/L Pager [phone] Office [phone]

## 2011-01-26 NOTE — Discharge Summary (Signed)
Patient ID: Tara [name] MRN: [id number] DOB/AGE: [birth year] 47 y.o.  Admit date: 01/25/2011 Discharge date: 01/29/2011  Admission Diagnoses:  Principal Problem:  *Lumbar disc herniation with radiculopathy Active Problems:  Chronic pain   Discharge Diagnoses:  Same  Past Medical History  Diagnosis Date  . CPRS 1 (complex regional pain syndrome I) of upper limb 2007    formerly called rsd  . COPD (chronic obstructive pulmonary disease)     uses spiriva  . Pneumonia     2011 - hospitalized for 4 days  . Headache     headaches due to pain  . Neuromuscular disorder     rsd  . Arthritis     arthritis in back  . Depression     being treated for depression    Surgeries: Procedure(s): LUMBAR LAMINECTOMY/DECOMPRESSION MICRODISCECTOMY on 01/25/2011   Consultants:    Discharged Condition: Improved  Hospital Course: Tara [name] is an 45 y.o. female who was admitted 01/25/2011 for operative treatment ofLumbar disc herniation with radiculopathy. Patient has severe unremitting pain that affects sleep, daily activities, and work/hobbies. After pre-op clearance the patient was taken to the operating room on 01/25/2011 and underwent  Procedure(s): LUMBAR LAMINECTOMY/DECOMPRESSION MICRODISCECTOMY.    Patient was given perioperative antibiotics: Anti-infectives     Start     Dose/Rate Route Frequency Ordered Stop   01/25/11 2200   ceFAZolin (ANCEF) IVPB 1 g/50 mL premix        1 g 100 mL/hr over 30 Minutes Intravenous 3 times per day 01/25/11 1829 01/26/11 0630   01/24/11 1400   ceFAZolin (ANCEF) IVPB 1 g/50 mL premix     Comments: Weight based protocol / to or      1 g 100 mL/hr over 30 Minutes Intravenous  Once 01/24/11 1355 01/25/11 1345           Patient was given sequential compression devices and early ambulation to prevent DVT.  Patient benefited maximally from hospital stay and there were no complications.    Recent vital signs:  Patient Vitals for the past  24 hrs:  BP Temp Temp src Pulse Resp SpO2  01/26/11 0602 92/56 mmHg 98.3 F (36.8 C) Oral 84  18  100 %  01/25/11 2040 107/66 mmHg 98.1 F (36.7 C) Oral 99  16  96 %  01/25/11 1820 112/40 mmHg 97.9 F (36.6 C) Oral 81  16  100 %  01/25/11 1745 - - - 81  13  100 %  01/25/11 1740 110/52 mmHg - - - - -  01/25/11 1730 - - - 80  17  100 %  01/25/11 1725 104/49 mmHg - - - - -  01/25/11 1715 - - - 82  15  100 %  01/25/11 1711 101/58 mmHg - - - - -  01/25/11 1700 - - - 85  18  100 %  01/25/11 1654 97/58 mmHg 97.1 F (36.2 C) - - - -  01/25/11 1217 105/68 mmHg 98 F (36.7 C) Oral 74  18  100 %     Recent laboratory studies: No results found for this basename: WBC:2,HGB:2,HCT:2,PLT:2,NA:2,K:2,CL:2,CO2:2,BUN:2,CREATININE:2,GLUCOSE:2,PT:2,INR:2,CALCIUM,2: in the last 72 hours   Discharge Medications:   Current Discharge Medication List    START taking these medications   Details  methocarbamol (ROBAXIN) 500 MG tablet Take 1 tablet (500 mg total) by mouth 3 (three) times daily. Qty: 60 tablet, Refills: 0    ondansetron (ZOFRAN) 4 MG tablet Take 1 tablet (4 mg total) by mouth every 8 (  eight) hours as needed for nausea. Qty: 20 tablet, Refills: 0    polyethylene glycol (MIRALAX) packet Take 17 g by mouth daily. Qty: 14 each, Refills: 0      CONTINUE these medications which have NOT CHANGED   Details  clonazePAM (KLONOPIN) 1 MG tablet Take 1 mg by mouth 2 (two) times daily as needed. For anxiety     fish oil-omega-3 fatty acids 1000 MG capsule Take 1 g by mouth 2 (two) times daily.      modafinil (PROVIGIL) 200 MG tablet Take 200 mg by mouth 2 (two) times daily.      Multiple Vitamins-Minerals (MULTIVITAMINS THER. W/MINERALS) TABS Take 1 tablet by mouth 2 (two) times daily.      omeprazole (PRILOSEC) 20 MG capsule Take 20 mg by mouth 2 (two) times daily with a meal.      oxycodone (OXYCONTIN) 30 MG TB12 Take 30 mg by mouth every 12 (twelve) hours.      oxyCODONE-acetaminophen  (PERCOCET) 10-325 MG per tablet Take 1 tablet by mouth every 4 (four) hours as needed. For pain     QUEtiapine (SEROQUEL) 50 MG tablet Take 100 mg by mouth at bedtime.      Vilazodone HCl (VIIBRYD) 40 MG TABS Take 40 mg by mouth daily.      zolpidem (AMBIEN) 10 MG tablet Take 10 mg by mouth at bedtime as needed. For sleep     l-methylfolate-B6-B12 (METANX) 3-35-2 MG TABS Take 1 tablet by mouth daily.        STOP taking these medications     meloxicam (MOBIC) 15 MG tablet      PRESCRIPTION MEDICATION         Diagnostic Studies: Dg Lumbar Spine 2-3 Views  01/25/2011  *RADIOLOGY REPORT*  Clinical Data: Back pain  LUMBAR SPINE - 2-3 VIEW  Comparison: None.  Findings: Intraoperative portable film #1 demonstrates a needle directed most closely toward the L5 vertebral body, lying between the L4-L5 spinous processes.  The L5-S1 disc space is narrowed.  IMPRESSION: As above.  Original Report Authenticated By: Elsie Stain, M.D.   Dg Lumbar Spine 2-3 Views  01/22/2011  *RADIOLOGY REPORT*  Clinical Data: Low back pain.  LUMBAR SPINE - 2-3 VIEW  Comparison: 12/13/2010 CT myelogram  Findings: Five lumbar type vertebral bodies are assumed.  There is disc space narrowing at L5-S1.  Battery pack for spinal cord stimulator noted overlying the left ilium.  There is very minimal scoliosis convex right. Lower lumbar facet arthropathy is present.  IMPRESSION: As above.  Plain films are numbered on this preoperative examination.  Original Report Authenticated By: Elsie Stain, M.D.   Dg Lumbar Spine 1 View  01/25/2011  *RADIOLOGY REPORT*  Clinical Data: Discectomy.  LUMBAR SPINE - 1 VIEW  Comparison: Multiple priors.  Findings: Single AP cross-table lateral view demonstrates a blunt probe overlying the L5-S1 interspace.  IMPRESSION: As above.  Original Report Authenticated By: Elsie Stain, M.D.    Disposition:   Discharge Orders    Future Orders Please Complete By Expires   Diet - low sodium  heart healthy      Call MD / Call 911      Comments:   If you experience chest pain or shortness of breath, CALL 911 and be transported to the hospital emergency room.  If you develope a fever above 101 F, pus (white drainage) or increased drainage or redness at the wound, or calf pain, call your surgeon's office.  Constipation Prevention      Comments:   Drink plenty of fluids.  Prune juice may be helpful.  You may use a stool softener, such as Colace (over the counter) 100 mg twice a day.  Use MiraLax (over the counter) for constipation as needed.   Increase activity slowly as tolerated      Weight Bearing as taught in Physical Therapy      Comments:   Use a walker or crutches as instructed.   Discharge wound care:      Comments:   keep clean and dry.  Change your bandage as instructed by your health care providers.  If your bandage has been discontinued, keep your incision clean and dry.  Pat dry after bathing.  DO NOT put lotion or powder on your incision.      Follow-up Information    Follow up with BROOKS,DAHARI D in 2 weeks.   Contact information:   [facility] [address], [address] [place] [address] [phone]         DC Plan:  DC to home on 01/29/11 with [facility] Disposition at DC: STABLE   Signed: Gwinda Maine 01/29/2011, 7:20 AM

## 2011-01-27 NOTE — Progress Notes (Signed)
She still has a good bit of leg pain into great toe.  Barely ambulatory and does not feel able to go home.

## 2011-01-27 NOTE — Progress Notes (Signed)
Occupational Therapy Treatment Patient Details Name: Tara [name] MRN: [id number] DOB: February 13, 1966 Today's Date: 01/27/2011  OT Assessment/Plan OT Assessment/Plan OT Plan: Discharge plan remains appropriate OT Frequency: Min 2X/week Follow Up Recommendations: Home health OT Equipment Recommended: Rolling walker with 5" wheels OT Goals ADL Goals ADL Goal: Grooming - Progress: Not addressed ADL Goal: Lower Body Bathing - Progress: Other (comment) (reviewed discussed tech. with/without A/E) ADL Goal: Lower Body Dressing - Progress: Other (comment) (discussed/reviewed tech. with/without A/E) ADL Goal: Toilet Transfer - Progress: Other (comment) (pt. verbalized tech. for utilizing back precautions) ADL Goal: Toileting - Clothing Manipulation - Progress: Other (comment) (reviewed not twisting with clothing management) ADL Goal: Toileting - Hygiene - Progress: Other (comment) (reviewed tech. for hygiene while maintaing bat precautions) ADL Goal: Additional Goal #1 - Progress: Met  OT Treatment Precautions/Restrictions  Precautions Precautions: Back Required Braces or Orthoses: Yes Spinal Brace: Lumbar corset Restrictions Weight Bearing Restrictions: No   ADL ADL Grooming: Simulated (pt. able to verbalize tech. utilizing back precautions) Upper Body Bathing: Simulated Upper Body Bathing Details (indicate cue type and reason): discussed how she would wash her hair without arching her back Lower Body Bathing: Simulated Lower Body Bathing Details (indicate cue type and reason): reviewed back precautions for safe ADLS Upper Body Dressing: Simulated Upper Body Dressing Details (indicate cue type and reason): pt. able to verbalize back precautions as it related to dressing (ie: not arching) Lower Body Dressing: Simulated Lower Body Dressing Details (indicate cue type and reason): reviewed crossing left leg over right, while maintaining back precautions Toilet Transfer: Simulated Toilet  Transfer Details (indicate cue type and reason): pt. able to verbalize turning to flush toilet, vs. twisting so to maintain back precautions ADL Comments: pt. states she is tired and limited by leg pain and deferred all oob therapy attempts, but did agree to reviewing back precautions and wanted to discuss safe ADLS Mobility  Bed Mobility Rolling Left: 6: Modified independent (Device/Increase time) Left Sidelying to Sit: 4: Min assist Left Sidelying to Sit Details (indicate cue type and reason): verbal cues for precautions Sit to Supine - Left: 4: Min assist Sit to Supine - Left Details (indicate cue type and reason): verbal cues for sequencing/precautions Transfers Sit to Stand: 5: Supervision;From bed Stand to Sit: 5: Supervision;To bed Exercises    End of Session OT - End of Session Activity Tolerance: Patient limited by pain Patient left: in bed;with call bell in reach General Behavior During Session: [facility] for tasks performed Cognition: [facility] for tasks performed  Robet Leu OTA/L 01/27/2011, 12:46 PM

## 2011-01-27 NOTE — Progress Notes (Signed)
Physical Therapy Treatment Patient Details Name: Tara [name] MRN: [id number] DOB: Sep 28, 1965 Today's Date: 01/27/2011  PT Assessment/Plan  PT - Assessment/Plan Comments on Treatment Session: Pt independently recalled 3/3 back precautions.  Pt able to don/doff lumbar corset independently. PT Plan: Discharge plan remains appropriate Follow Up Recommendations: Home health PT Equipment Recommended: Rolling walker with 5" wheels PT Goals  Acute Rehab PT Goals PT Goal: Supine/Side to Sit - Progress: Progressing toward goal PT Goal: Sit to Supine/Side - Progress: Progressing toward goal PT Goal: Ambulate - Progress: Progressing toward goal  PT Treatment Precautions/Restrictions  Precautions Precautions: Back Precaution Booklet Issued: Yes (comment) (back handout) Required Braces or Orthoses: Yes Spinal Brace: Lumbar corset Restrictions Weight Bearing Restrictions: No Mobility (including Balance) Bed Mobility Rolling Left: 6: Modified independent (Device/Increase time) Left Sidelying to Sit: 4: Min assist Left Sidelying to Sit Details (indicate cue type and reason): verbal cues for precautions Sit to Supine - Left: 4: Min assist Sit to Supine - Left Details (indicate cue type and reason): verbal cues for sequencing/precautions Transfers Sit to Stand: 5: Supervision;From bed Stand to Sit: 5: Supervision;To bed Ambulation/Gait Ambulation/Gait Assistance: 5: Supervision Ambulation Distance (Feet): 120 Feet Assistive device: Rolling walker Gait Pattern: Within Functional Limits    Exercise    End of Session PT - End of Session Equipment Utilized During Treatment: Gait belt Activity Tolerance: Patient tolerated treatment well Patient left: in bed General Behavior During Session: [facility] for tasks performed Cognition: [facility] for tasks performed  Ilda Foil 01/27/2011, 11:36 AM

## 2011-01-28 NOTE — Progress Notes (Signed)
Physical Therapy Treatment Patient Details Name: Tara [name] MRN: [id number] DOB: [birth year] Today's Date: 01/28/2011  PT Assessment/Plan  PT - Assessment/Plan Comments on Treatment Session: Pt independently recalled 3/3 back precautions.  Pt able to don/doff lumbar corset independently. PT Plan: Discharge plan remains appropriate PT Frequency: Min 6X/week Follow Up Recommendations: Home health PT Equipment Recommended: Rolling walker with 5" wheels PT Goals  Acute Rehab PT Goals PT Goal: Supine/Side to Sit - Progress: Met PT Goal: Sit to Supine/Side - Progress: Progressing toward goal Pt will Ambulate: 51 - 150 feet;with least restrictive assistive device;with modified independence PT Goal: Ambulate - Progress: Progressing toward goal Additional Goals PT Goal: Additional Goal #1 - Progress: Progressing toward goal  PT Treatment Precautions/Restrictions  Precautions Precautions: Back Precaution Booklet Issued: Yes (comment) (back handout) Required Braces or Orthoses: Yes Spinal Brace: Lumbar corset Restrictions Weight Bearing Restrictions: No Mobility (including Balance) Bed Mobility Rolling Left: 6: Modified independent (Device/Increase time) Left Sidelying to Sit: 6: Modified independent (Device/Increase time) Sit to Supine - Left: 5: Supervision Sit to Supine - Left Details (indicate cue type and reason): cues for sequencing/precautions Transfers Sit to Stand: 5: Supervision Sit to Stand Details (indicate cue type and reason): verbal cues for safety Stand to Sit: 5: Supervision Stand to Sit Details: verbal cues for sequencing Ambulation/Gait Ambulation/Gait Assistance: 5: Supervision Ambulation/Gait Assistance Details (indicate cue type and reason): verbal cues for posture, RW management Ambulation Distance (Feet): 150 Feet Assistive device: Rolling walker Gait Pattern: Decreased weight shift to left Gait velocity: slow cadence    Exercise    End of  Session PT - End of Session Equipment Utilized During Treatment: Gait belt Activity Tolerance: Patient tolerated treatment well Patient left: in bed General Behavior During Session: [facility] for tasks performed Cognition: [facility] for tasks performed  Ilda Foil 01/28/2011, 10:49 AM  Aida Raider, PT pager # [phone]

## 2011-01-28 NOTE — Progress Notes (Signed)
  Subjective: 3 Days Post-Op Procedure(s) (LRB): LUMBAR LAMINECTOMY/DECOMPRESSION MICRODISCECTOMY (Left)    Patient reports pain as moderate.  Still with leg pain, decreased mobility, hoping to review with [facility] prior to D/C.  Stimulator in room used for RUE RSD  Objective:   VITALS:   Filed Vitals:   01/28/11 0545  BP: 119/68  Pulse: 66  Temp: 98.4 F (36.9 C)  Resp: 16    Neurovascular intact Incision: dressing C/D/I and steri strips in place  LABS No results found for this basename: HGB:3,HCT:3,WBC:3,PLT:3 in the last 72 hours  No results found for this basename: NA:3,K:3,BUN:3,CREATININE:3,GLUCOSE:3 in the last 72 hours  No results found for this basename: LABPT:2,INR:2 in the last 72 hours   Assessment/Plan: 3 Days Post-Op Procedure(s) (LRB): LUMBAR LAMINECTOMY/DECOMPRESSION MICRODISCECTOMY (Left)   Up with therapy Discharge home with home health Pt feels she needs help at home, with decreased mobility, wants to review with Shon Baton

## 2011-01-29 MED ORDER — MAGNESIUM CITRATE PO SOLN
296.0000 mL | Freq: Once | ORAL | Status: AC
  Administered 2011-01-29: 296 mL via ORAL
  Filled 2011-01-29: qty 296

## 2011-01-29 MED ORDER — FLEET ENEMA 7-19 GM/118ML RE ENEM: 1.0000 | ENEMA | Freq: Once | RECTAL | Status: DC

## 2011-01-29 NOTE — Progress Notes (Signed)
Physical Therapy Treatment Patient Details Name: Tara [name] MRN: [id number] DOB: 07/06/1965 Today's Date: 01/29/2011  PT Assessment/Plan  PT - Assessment/Plan Comments on Treatment Session: Pt progressing well. Anticipate DC home today per MD note PT Plan: Discharge plan remains appropriate PT Frequency: Min 6X/week Follow Up Recommendations: Home health PT Equipment Recommended: Rolling walker with 5" wheels;3 in 1 bedside comode PT Goals  Acute Rehab PT Goals PT Goal: Supine/Side to Sit - Progress: Met PT Goal: Sit to Supine/Side - Progress: Other (comment) (not assessed) PT Goal: Ambulate - Progress: Met PT Goal: Up/Down Stairs - Progress: Other (comment) (not assessed pt declines)  PT Treatment Precautions/Restrictions  Precautions Precautions: Back Precaution Booklet Issued: Yes (comment) (back handout) Precaution Comments: Pt. I with all 3 precautions. Required Braces or Orthoses: Yes Spinal Brace: Lumbar corset;Applied in standing position Restrictions Weight Bearing Restrictions: No Mobility (including Balance) Bed Mobility Bed Mobility: Yes Left Sidelying to Sit: 6: Modified independent (Device/Increase time) Transfers Transfers: Yes Sit to Stand: 7: Independent Stand to Sit: 7: Independent Ambulation/Gait Ambulation/Gait: Yes Ambulation/Gait Assistance: 6: Modified independent (Device/Increase time) Ambulation Distance (Feet): 300 Feet Assistive device: Rolling walker Gait Pattern: Within Functional Limits Stairs: No Wheelchair Mobility Wheelchair Mobility: No    Exercise    End of Session PT - End of Session Equipment Utilized During Treatment: Back brace Activity Tolerance: Patient tolerated treatment well Patient left: in chair;with call bell in reach Nurse Communication: Mobility status for transfers;Mobility status for ambulation General Behavior During Session: [facility] for tasks performed Cognition: [facility] for tasks performed  Orlan Aversa, Adline Potter 01/29/2011, 9:04 AM 01/29/2011 Fredrich Birks PTA [phone] pager [phone] office

## 2011-01-29 NOTE — Progress Notes (Signed)
Occupational Therapy Treatment Patient Details Name: Tara [name] MRN: [id number] DOB: [birth year] Today's Date: 01/29/2011  OT Assessment/Plan OT Assessment/Plan OT Plan: Discharge plan remains appropriate OT Frequency: Min 2X/week Follow Up Recommendations: Home health OT Equipment Recommended: 3 in 1 bedside comode OT Goals ADL Goals ADL Goal: Grooming - Progress: Not addressed ADL Goal: Lower Body Bathing - Progress: Other (comment) (pt. states family to assist) ADL Goal: Lower Body Dressing - Progress: Other (comment) (pt. states family to assist) ADL Goal: Statistician - Progress: Met ADL Goal: Toileting - Clothing Manipulation - Progress: Met ADL Goal: Toileting - Hygiene - Progress: Met ADL Goal: Additional Goal #1 - Progress: Met  OT Treatment Precautions/Restrictions  Precautions Precautions: Back Precaution Comments: I with all 3 precautions Required Braces or Orthoses: Yes Spinal Brace: Lumbar corset Restrictions Weight Bearing Restrictions: No   ADL ADL Toilet Transfer: Performed;Modified independent Toilet Transfer Method: Proofreader: Raised toilet seat with arms (or 3-in-1 over toilet) Toileting - Clothing Manipulation: Performed;Modified independent Where Assessed - Toileting Clothing Manipulation: Sit to stand from 3-in-1 or toilet Toileting - Hygiene: Simulated;Modified independent Where Assessed - Toileting Hygiene: Sit to stand from 3-in-1 or toilet Equipment Used: Rolling walker ADL Comments: pt. states she is feeling much better and is ready for d/c home. will have family assist for LB bathing/dressing. demonstrated back precautions while completing aspects of self care Mobility  Bed Mobility Bed Mobility: Yes Rolling Left: 6: Modified independent (Device/Increase time) Left Sidelying to Sit: 6: Modified independent (Device/Increase time) Sit to Supine - Left: 6: Modified independent (Device/Increase  time) Transfers Transfers: Yes Sit to Stand: 7: Independent Stand to Sit: 7: Independent Exercises    End of Session OT - End of Session Equipment Utilized During Treatment: Other (comment) (pt. report:permission to/from b.room without brace) Activity Tolerance: Patient tolerated treatment well Patient left: in bed;with call bell in reach General Behavior During Session: [facility] for tasks performed Cognition: [facility] for tasks performed  Robet Leu OTA/L [phone] 01/29/2011, 1:22 PM

## 2011-01-29 NOTE — Progress Notes (Signed)
Subjective: Procedure(s) (LRB): LUMBAR LAMINECTOMY/DECOMPRESSION MICRODISCECTOMY (Left) 4 Days Post-Op  Patient reports pain as mild.  Reports none leg pain denies incisional back pain   Positive void Negative bowel movement Positive flatus Negative chest pain or shortness of breath  Objective: Vital signs in last 24 hours: Temp:  [98.3 F (36.8 C)-98.7 F (37.1 C)] 98.3 F (36.8 C) (11/18 2200) Pulse Rate:  [61-70] 61  (11/18 2200) Resp:  [18] 18  (11/18 2200) BP: (109-115)/(66-67) 115/66 mmHg (11/18 2200) SpO2:  [96 %-97 %] 97 % (11/18 2200)  Intake/Output from previous day: 11/18 0701 - 11/19 0700 In: 1320 [P.O.:1320] Out: -   No results found for this basename: WBC:2,RBC:2,HCT:2,PLT:2 in the last 72 hours No results found for this basename: NA:2,K:2,CL:2,CO2:2,BUN:2,CREATININE:2,GLUCOSE:2,CALCIUM:2 in the last 72 hours No results found for this basename: LABPT:2,INR:2 in the last 72 hours  ABD soft Neurovascular intact Sensation intact distally Intact pulses distally Dorsiflexion/Plantar flexion intact Incision: dressing C/D/I Compartment soft  Assessment/Plan: Patient stable Continue care  Magnesium citrate and Fleet enema for constipation Discharge home with home health  Gwinda Maine 01/29/2011, 7:29 AM

## 2011-01-29 NOTE — Progress Notes (Signed)
Spoke with patient, choice offered. She lives in [place] and has family assistance. Rolling walker and 3in1 ordered, Home Health PT request entered in[facility].

## 2011-01-30 ENCOUNTER — Encounter (HOSPITAL_COMMUNITY): Payer: Self-pay | Admitting: Orthopedic Surgery

## 2011-06-08 ENCOUNTER — Other Ambulatory Visit: Payer: Self-pay | Admitting: Orthopedic Surgery

## 2011-06-08 DIAGNOSIS — M5126 Other intervertebral disc displacement, lumbar region: Secondary | ICD-10-CM

## 2011-06-13 ENCOUNTER — Ambulatory Visit
Admission: RE | Admit: 2011-06-13 | Discharge: 2011-06-13 | Disposition: A | Discharge status: Home / Self Care | Payer: Self-pay | Source: Ambulatory Visit | Attending: Orthopedic Surgery | Admitting: Orthopedic Surgery

## 2011-06-13 VITALS — BP 96/56 | HR 64

## 2011-06-13 DIAGNOSIS — M5116 Intervertebral disc disorders with radiculopathy, lumbar region: Secondary | ICD-10-CM

## 2011-06-13 DIAGNOSIS — M5126 Other intervertebral disc displacement, lumbar region: Secondary | ICD-10-CM

## 2011-06-13 DIAGNOSIS — G8929 Other chronic pain: Secondary | ICD-10-CM

## 2011-06-13 MED ORDER — HYDROMORPHONE HCL PF 2 MG/ML IJ SOLN
2.0000 mg | Freq: Once | INTRAMUSCULAR | Status: AC
  Administered 2011-06-13: 2 mg via INTRAMUSCULAR

## 2011-06-13 MED ORDER — ONDANSETRON HCL 4 MG/2ML IJ SOLN
4.0000 mg | Freq: Once | INTRAMUSCULAR | Status: AC
  Administered 2011-06-13: 4 mg via INTRAMUSCULAR

## 2011-06-13 MED ORDER — IOHEXOL 180 MG/ML  SOLN
17.0000 mL | Freq: Once | INTRAMUSCULAR | Status: AC | PRN
  Administered 2011-06-13: 17 mL via INTRATHECAL

## 2011-06-13 MED ORDER — DIAZEPAM 5 MG PO TABS
10.0000 mg | ORAL_TABLET | Freq: Once | ORAL | Status: AC
  Administered 2011-06-13: 10 mg via ORAL

## 2011-06-13 NOTE — Progress Notes (Addendum)
Pt has been off setraline and viibryd for the last 2 days.  1138 pt is resting quietly, pain is relieved and taking po's well.

## 2011-06-13 NOTE — Discharge Instructions (Signed)
Myelogram Discharge Instructions  1. Go home and rest quietly for the next 24 hours.  It is important to lie flat for the next 24 hours.  Get up only to go to the restroom.  You may lie in the bed or on a couch on your back, your stomach, your left side or your right side.  You may have one pillow under your head.  You may have pillows between your knees while you are on your side or under your knees while you are on your back.  2. DO NOT drive today.  Recline the seat as far back as it will go, while still wearing your seat belt, on the way home.  3. You may get up to go to the bathroom as needed.  You may sit up for 10 minutes to eat.  You may resume your normal diet and medications unless otherwise indicated.  Drink lots of extra fluids today and tomorrow.  4. The incidence of headache, nausea, or vomiting is about 5% (one in 20 patients).  If you develop a headache, lie flat and drink plenty of fluids until the headache goes away.  Caffeinated beverages may be helpful.  If you develop severe nausea and vomiting or a headache that does not go away with flat bed rest, call [phone].  5. You may resume normal activities after your 24 hours of bed rest is over; however, do not exert yourself strongly or do any heavy lifting tomorrow.  6. Call your physician for a follow-up appointment.  The results of your myelogram will be sent directly to your physician by the following day.  7. If you have any questions or if complications develop after you arrive home, please call [phone].  Discharge instructions have been explained to the patient.  The patient, or the person responsible for the patient, fully understands these instructions.       May resume sertraline and viibryd on June 14, 2011,after 11:00 am.

## 2011-10-15 ENCOUNTER — Ambulatory Visit: Payer: Self-pay | Admitting: Family Medicine

## 2011-10-18 ENCOUNTER — Ambulatory Visit: Payer: Self-pay | Admitting: Family Medicine

## 2012-01-15 DIAGNOSIS — Z969 Presence of functional implant, unspecified: Secondary | ICD-10-CM

## 2012-01-15 NOTE — H&P (Signed)
Tara [name] 01/04/2012 1:45 PM Location: [facility] Patient #: [id number] DOB: [birth year] Separated / Language: [place] / Race: White Female   History of Present Illness(Tara [name]; 01/04/2012 1:48 PM) The patient is a 46 year old female who presents today for follow up of their back. The patient is being followed for their left-sided (worse) back pain. They are now 6 year(s) out from injury. The following medication has been used for pain control: Oxycodone ([facility]) and opana. The patient reports their current pain level to be 7 / 10.    Subjective Transcription(Tara Vora Sheela Stack, MD; 01/10/2012 8:24 AM)  The patient returns today for a followup. At this point in time, we are approximately four years out from her cervical disk replacement and three years out from her cervical spinal cord stimulator placement and one year out from her lumbar decompression diskectomy.    Overall, she notes that the radicular left leg pain has improved since her surgery last year. However, she still occasionally gets burning dysesthesia pain in what she describes as the L5 distribution. She also continues to have significant battery site morbidity. There is pain and tenderness at the site of her battery. She was also very clear in stating that after I anesthetized her battery site with Lidocaine she did have relief of a part of her back pain. She has continued to have her overall back pain which I believe is more from the degenerative disk disease. The neck pain is at its baseline and it really has not significantly changed. She no longer uses the stimulator.    She has also noted increasing left knee pain. She states that it feels unstable. Sometimes, she wants to buckle. She has not fallen due to her knee pain. She states this is a relatively new phenomena but this anterior knee pain is very different from the radicular leg pain she has had in the  past.    Allergies(Tara [name]; 01/04/2012 1:49 PM) No Known Drug Allergies. 01/04/2012 (Marked as Inactive) CloNIDine HCl (Analgesia) *ANALGESICS - NonNarcotic*   Social History(Tara [name]; 01/04/2012 1:48 PM) Tobacco use. Former smoker.   Medication History(Tara [name]; 01/04/2012 1:49 PM) Lyrica (100MG  Capsule, Oral) Active. OxyCODONE HCl ( Oral) Specific dose unknown - Active. OxyCONTIN ( Oral) Specific dose unknown - Active. Mobic ( Oral) Specific dose unknown - Active.   Objective Transcription(Tara Elwell Sheela Stack, MD; 01/10/2012 8:24 AM)  She is a pleasant young woman. She appears younger than her stated age. She is alert and oriented times three. No shortness of breath or chest pain. The lung fields are clear to auscultation. No rubs, gallops or murmurs. Abdomen is soft and nontender. There is a well healed surgical scar in the lumbar spine, both midline and on the left gluteal region where her battery site is. She has 5/5 strength in her lower extremity. Decreased sensation at the left L5 distribution. No foot drop. Negative Babinski. No clonus. She does have anterior knee pain with range of motion and circumduction. No hip or ankle pain on the left side. The right side is asymptomatic.    She has significant underlying back pain, especially with forward flexion. Extension also creates pain as does twisting.        Plans Transcription(Tara Goldberg D Tara Timm, MD; 01/10/2012 8:24 AM)  At this point in time, the patient has symptomatic hardware (spinal cord stimulator battery). Although she has multiple issues that are all contributing to her overall pain syndrome, I do  believe that by removing the battery we can hopefully somewhat positively impact her overall quality of life. This is the least invasive treatment option. I think it may gain [place] some improvement and hopefully prolong or even prevent the need for an anterior lumbar interbody fusion to  address the post laminectomy syndrome (degenerative disk disease) that she is suffering from that I think is also contributing to overall back pain.    With respect to the left knee, if this continues to be an issue, at some point we can get an X-ray and possibly an MRI and discuss injection therapy if it is needed. I do think that since the leg pain is improving, the nerve pain is improving. I would not recommend a nerve conduction test or further diagnostic work up.    We will go ahead and proceed with the removal of the spinal cord stimulator in the next week or two.    Miscellaneous Transcription(Tara Villella Sheela Stack, MD; 01/10/2012 8:24 AM)  Tara Garret D. Shon Baton, MD/jgc

## 2012-01-16 ENCOUNTER — Encounter (HOSPITAL_COMMUNITY)
Admission: RE | Admit: 2012-01-16 | Discharge: 2012-01-16 | Disposition: A | Discharge status: Home / Self Care | Payer: Self-pay | Source: Ambulatory Visit | Attending: Orthopedic Surgery | Admitting: Orthopedic Surgery

## 2012-01-16 ENCOUNTER — Encounter (HOSPITAL_COMMUNITY): Payer: Self-pay

## 2012-01-16 ENCOUNTER — Ambulatory Visit (HOSPITAL_COMMUNITY)
Admission: RE | Admit: 2012-01-16 | Discharge: 2012-01-16 | Disposition: A | Discharge status: Home / Self Care | Payer: Self-pay | Source: Ambulatory Visit | Attending: Anesthesiology | Admitting: Anesthesiology

## 2012-01-16 ENCOUNTER — Encounter (HOSPITAL_COMMUNITY): Payer: Self-pay | Admitting: Pharmacy Technician

## 2012-01-16 DIAGNOSIS — Z01811 Encounter for preprocedural respiratory examination: Secondary | ICD-10-CM | POA: Insufficient documentation

## 2012-01-16 DIAGNOSIS — R079 Chest pain, unspecified: Secondary | ICD-10-CM | POA: Insufficient documentation

## 2012-01-16 LAB — CBC
HCT: 38.9 % (ref 36.0–46.0)
Hemoglobin: 13.5 g/dL (ref 12.0–15.0)
MCH: 32.5 pg (ref 26.0–34.0)
MCHC: 34.7 g/dL (ref 30.0–36.0)
MCV: 93.7 fL (ref 78.0–100.0)
RBC: 4.15 MIL/uL (ref 3.87–5.11)

## 2012-01-16 LAB — SURGICAL PCR SCREEN: MRSA, PCR: NEGATIVE

## 2012-01-16 MED ORDER — CEFAZOLIN SODIUM-DEXTROSE 2-3 GM-% IV SOLR
2.0000 g | INTRAVENOUS | Status: AC
  Administered 2012-01-17: 2 g via INTRAVENOUS
  Filled 2012-01-16: qty 50

## 2012-01-16 NOTE — Progress Notes (Signed)
Spoke with Clydie Braun at [facility] regarding order for back brace. Patient was not aware that she needed a back brace for surgery. Requested that office contact patient and arrange for same if it was needed.

## 2012-01-16 NOTE — Pre-Procedure Instructions (Addendum)
[name]  01/16/2012   Your procedure is scheduled on: November 7  Report to [facility] at 09:30 AM.  Call this number if you have problems the morning of surgery: [phone]   Remember:   Do not eat or drink:After Midnight.  Take these medicines the morning of surgery with A SIP OF WATER: Klonopin, Oxycodone, Opana, Omeprazole, Vilazodone, Zoloft, Spiriva   Do not wear jewelry, make-up or nail polish.  Do not wear lotions, powders, or perfumes. You may wear deodorant.  Do not shave 48 hours prior to surgery. Men may shave face and neck.  Do not bring valuables to the hospital.  Contacts, dentures or bridgework may not be worn into surgery.  Leave suitcase in the car. After surgery it may be brought to your room.  For patients admitted to the hospital, checkout time is 11:00 AM the day of discharge.   Patients discharged the day of surgery will not be allowed to drive home.  Name and phone number of your driver: Family  Special Instructions: Shower using CHG 2 nights before surgery and the night before surgery.  If you shower the day of surgery use CHG.  Use special wash - you have one bottle of CHG for all showers.  You should use approximately 1/3 of the bottle for each shower.   Please read over the following fact sheets that you were given: Pain Booklet, Coughing and Deep Breathing and Surgical Site Infection Prevention

## 2012-01-17 ENCOUNTER — Encounter (HOSPITAL_COMMUNITY): Payer: Self-pay | Admitting: *Deleted

## 2012-01-17 ENCOUNTER — Ambulatory Visit (HOSPITAL_COMMUNITY)
Admission: RE | Admit: 2012-01-17 | Discharge: 2012-01-17 | Disposition: A | Discharge status: Home / Self Care | Payer: Worker's Compensation | Source: Ambulatory Visit | Attending: Orthopedic Surgery | Admitting: Orthopedic Surgery

## 2012-01-17 ENCOUNTER — Encounter (HOSPITAL_COMMUNITY)
Admission: RE | Disposition: A | Discharge status: Home / Self Care | Payer: Self-pay | Source: Ambulatory Visit | Attending: Orthopedic Surgery

## 2012-01-17 ENCOUNTER — Encounter (HOSPITAL_COMMUNITY): Payer: Self-pay | Admitting: Anesthesiology

## 2012-01-17 ENCOUNTER — Ambulatory Visit (HOSPITAL_COMMUNITY): Payer: Worker's Compensation | Admitting: Anesthesiology

## 2012-01-17 DIAGNOSIS — J4489 Other specified chronic obstructive pulmonary disease: Secondary | ICD-10-CM | POA: Insufficient documentation

## 2012-01-17 DIAGNOSIS — J449 Chronic obstructive pulmonary disease, unspecified: Secondary | ICD-10-CM | POA: Insufficient documentation

## 2012-01-17 DIAGNOSIS — Y831 Surgical operation with implant of artificial internal device as the cause of abnormal reaction of the patient, or of later complication, without mention of misadventure at the time of the procedure: Secondary | ICD-10-CM | POA: Insufficient documentation

## 2012-01-17 DIAGNOSIS — IMO0002 Reserved for concepts with insufficient information to code with codable children: Secondary | ICD-10-CM | POA: Insufficient documentation

## 2012-01-17 DIAGNOSIS — T85890A Other specified complication of nervous system prosthetic devices, implants and grafts, initial encounter: Secondary | ICD-10-CM | POA: Insufficient documentation

## 2012-01-17 DIAGNOSIS — Z969 Presence of functional implant, unspecified: Secondary | ICD-10-CM

## 2012-01-17 DIAGNOSIS — M549 Dorsalgia, unspecified: Secondary | ICD-10-CM | POA: Insufficient documentation

## 2012-01-17 HISTORY — PX: SPINAL CORD STIMULATOR REMOVAL: SHX5379

## 2012-01-17 SURGERY — LUMBAR SPINAL CORD STIMULATOR REMOVAL
Anesthesia: General | Site: Back | Wound class: Clean

## 2012-01-17 MED ORDER — ACETAMINOPHEN 10 MG/ML IV SOLN
INTRAVENOUS | Status: AC
  Filled 2012-01-17: qty 100

## 2012-01-17 MED ORDER — HYDROMORPHONE HCL PF 1 MG/ML IJ SOLN: 0.2500 mg | INTRAMUSCULAR | Status: DC | PRN

## 2012-01-17 MED ORDER — OXYCODONE HCL 5 MG/5ML PO SOLN: 5.0000 mg | Freq: Once | ORAL | Status: DC | PRN

## 2012-01-17 MED ORDER — ONDANSETRON HCL 4 MG/2ML IJ SOLN
INTRAMUSCULAR | Status: DC | PRN
  Administered 2012-01-17: 4 mg via INTRAVENOUS

## 2012-01-17 MED ORDER — PROPOFOL 10 MG/ML IV BOLUS
INTRAVENOUS | Status: DC | PRN
  Administered 2012-01-17: 200 mg via INTRAVENOUS

## 2012-01-17 MED ORDER — METOCLOPRAMIDE HCL 5 MG/ML IJ SOLN: 10.0000 mg | Freq: Once | INTRAMUSCULAR | Status: DC | PRN

## 2012-01-17 MED ORDER — ACETAMINOPHEN 10 MG/ML IV SOLN
1000.0000 mg | Freq: Once | INTRAVENOUS | Status: AC
  Administered 2012-01-17: 1000 mg via INTRAVENOUS
  Filled 2012-01-17: qty 100

## 2012-01-17 MED ORDER — BUPIVACAINE-EPINEPHRINE 0.25% -1:200000 IJ SOLN
INTRAMUSCULAR | Status: DC | PRN
  Administered 2012-01-17: 20 mL

## 2012-01-17 MED ORDER — BUPIVACAINE-EPINEPHRINE PF 0.25-1:200000 % IJ SOLN
INTRAMUSCULAR | Status: AC
  Filled 2012-01-17: qty 30

## 2012-01-17 MED ORDER — OXYCODONE HCL 5 MG PO TABS: 5.0000 mg | ORAL_TABLET | Freq: Once | ORAL | Status: DC | PRN

## 2012-01-17 MED ORDER — DEXAMETHASONE SODIUM PHOSPHATE 10 MG/ML IJ SOLN
10.0000 mg | Freq: Once | INTRAMUSCULAR | Status: AC
  Administered 2012-01-17: 10 mg via INTRAVENOUS

## 2012-01-17 MED ORDER — LACTATED RINGERS IV SOLN
INTRAVENOUS | Status: DC
  Administered 2012-01-17: 11:00:00 via INTRAVENOUS

## 2012-01-17 MED ORDER — LIDOCAINE HCL (CARDIAC) 20 MG/ML IV SOLN
INTRAVENOUS | Status: DC | PRN
  Administered 2012-01-17: 100 mg via INTRAVENOUS

## 2012-01-17 SURGICAL SUPPLY — 37 items
CANISTER SUCTION 2500CC (MISCELLANEOUS) ×2 IMPLANT
CLOTH BEACON ORANGE TIMEOUT ST (SAFETY) ×2 IMPLANT
CLSR STERI-STRIP ANTIMIC 1/2X4 (GAUZE/BANDAGES/DRESSINGS) ×1 IMPLANT
DRAPE INCISE IOBAN 85X60 (DRAPES) ×1 IMPLANT
DRAPE SURG 17X23 STRL (DRAPES) ×1 IMPLANT
DRAPE U-SHAPE 47X51 STRL (DRAPES) ×1 IMPLANT
DRSG MEPILEX BORDER 4X4 (GAUZE/BANDAGES/DRESSINGS) ×2 IMPLANT
DURAPREP 26ML APPLICATOR (WOUND CARE) ×2 IMPLANT
ELECT CAUTERY BLADE 6.4 (BLADE) ×2 IMPLANT
ELECT REM PT RETURN 9FT ADLT (ELECTROSURGICAL) ×2
ELECTRODE REM PT RTRN 9FT ADLT (ELECTROSURGICAL) ×1 IMPLANT
GLOVE BIOGEL PI IND STRL 8.5 (GLOVE) ×1 IMPLANT
GLOVE BIOGEL PI INDICATOR 8.5 (GLOVE) ×1
GLOVE ECLIPSE 8.5 STRL (GLOVE) ×3 IMPLANT
GOWN PREVENTION PLUS XXLARGE (GOWN DISPOSABLE) ×2 IMPLANT
KIT BASIN OR (CUSTOM PROCEDURE TRAY) ×2 IMPLANT
KIT ROOM TURNOVER OR (KITS) ×2 IMPLANT
KIT WRENCH (KITS) ×1 IMPLANT
NDL SUT 6 .5 CRC .975X.05 MAYO (NEEDLE) ×1 IMPLANT
NEEDLE 22X1 1/2 (OR ONLY) (NEEDLE) ×1 IMPLANT
NEEDLE MAYO TAPER (NEEDLE)
NS IRRIG 1000ML POUR BTL (IV SOLUTION) ×2 IMPLANT
PACK LAMINECTOMY ORTHO (CUSTOM PROCEDURE TRAY) ×1 IMPLANT
PACK UNIVERSAL I (CUSTOM PROCEDURE TRAY) ×2 IMPLANT
SPONGE LAP 4X18 X RAY DECT (DISPOSABLE) ×1 IMPLANT
STAPLER VISISTAT 35W (STAPLE) ×2 IMPLANT
STRIP CLOSURE SKIN 1/2X4 (GAUZE/BANDAGES/DRESSINGS) ×2 IMPLANT
SUT MNCRL AB 3-0 PS2 18 (SUTURE) ×3 IMPLANT
SUT VIC AB 1 CT1 27 (SUTURE) ×2
SUT VIC AB 1 CT1 27XBRD ANBCTR (SUTURE) ×3 IMPLANT
SUT VIC AB 2-0 CT1 18 (SUTURE) ×2 IMPLANT
SYR BULB IRRIGATION 50ML (SYRINGE) ×2 IMPLANT
SYR CONTROL 10ML LL (SYRINGE) ×2 IMPLANT
TOWEL OR 17X24 6PK STRL BLUE (TOWEL DISPOSABLE) ×2 IMPLANT
TOWEL OR 17X26 10 PK STRL BLUE (TOWEL DISPOSABLE) ×2 IMPLANT
TRAY FOLEY CATH 14FR (SET/KITS/TRAYS/PACK) IMPLANT
WATER STERILE IRR 1000ML POUR (IV SOLUTION) ×2 IMPLANT

## 2012-01-17 NOTE — Anesthesia Preprocedure Evaluation (Signed)
Anesthesia Evaluation  Patient identified by MRN, date of birth, ID band Patient awake    Reviewed: Allergy & Precautions, H&P , NPO status , Patient's Chart, lab work & pertinent test results, reviewed documented beta blocker date and time   Airway Mallampati: II TM Distance: >3 FB Neck ROM: full    Dental   Pulmonary pneumonia -, resolved, COPD COPD inhaler,  breath sounds clear to auscultation        Cardiovascular negative cardio ROS  Rhythm:regular     Neuro/Psych  Headaches, PSYCHIATRIC DISORDERS  Neuromuscular disease    GI/Hepatic negative GI ROS, Neg liver ROS,   Endo/Other  negative endocrine ROS  Renal/GU negative Renal ROS  negative genitourinary   Musculoskeletal   Abdominal   Peds  Hematology negative hematology ROS (+)   Anesthesia Other Findings See surgeon's H&P   Reproductive/Obstetrics negative OB ROS                           Anesthesia Physical Anesthesia Plan  ASA: II  Anesthesia Plan: General   Post-op Pain Management:    Induction: Intravenous  Airway Management Planned: LMA  Additional Equipment:   Intra-op Plan:   Post-operative Plan:   Informed Consent: I have reviewed the patients History and Physical, chart, labs and discussed the procedure including the risks, benefits and alternatives for the proposed anesthesia with the patient or authorized representative who has indicated his/her understanding and acceptance.   Dental Advisory Given  Plan Discussed with: CRNA and Surgeon  Anesthesia Plan Comments:         Anesthesia Quick Evaluation

## 2012-01-17 NOTE — H&P (Signed)
H+P reviewed  No change in clinical exam 

## 2012-01-17 NOTE — Anesthesia Procedure Notes (Signed)
Procedure Name: LMA Insertion Date/Time: 01/17/2012 11:43 AM Performed by: Arlice Colt B Pre-anesthesia Checklist: Patient identified, Emergency Drugs available, Suction available, Patient being monitored and Timeout performed Patient Re-evaluated:Patient Re-evaluated prior to inductionOxygen Delivery Method: Circle system utilized Preoxygenation: Pre-oxygenation with 100% oxygen Intubation Type: IV induction LMA: LMA inserted LMA Size: 4.0 Number of attempts: 1 Placement Confirmation: positive ETCO2 and breath sounds checked- equal and bilateral Tube secured with: Tape Dental Injury: Teeth and Oropharynx as per pre-operative assessment

## 2012-01-17 NOTE — Anesthesia Postprocedure Evaluation (Signed)
Anesthesia Post Note  Patient: Tara [name]  Procedure(s) Performed: Procedure(s) (LRB): LUMBAR SPINAL CORD STIMULATOR REMOVAL (N/A)  Anesthesia type: general  Patient location: PACU  Post pain: Pain level controlled  Post assessment: Patient's Cardiovascular Status Stable  Last Vitals:  Filed Vitals:   01/17/12 1300  BP: 104/52  Pulse: 73  Temp:   Resp: 10    Post vital signs: Reviewed and stable  Level of consciousness: sedated  Complications: No apparent anesthesia complications

## 2012-01-17 NOTE — Transfer of Care (Signed)
Immediate Anesthesia Transfer of Care Note  Patient: Tara [name]  Procedure(s) Performed: Procedure(s) (LRB) with comments: LUMBAR SPINAL CORD STIMULATOR REMOVAL (N/A) - Spinal Cord Battery Removal  Patient Location: PACU  Anesthesia Type:General  Level of Consciousness: awake, alert  and oriented  Airway & Oxygen Therapy: Patient Spontanous Breathing and Patient connected to nasal cannula oxygen  Post-op Assessment: Report given to PACU RN and Post -op Vital signs reviewed and stable  Post vital signs: Reviewed and stable  Complications: No apparent anesthesia complications

## 2012-01-17 NOTE — Brief Op Note (Signed)
01/17/2012  12:17 PM  PATIENT:  Tara [name]  46 y.o. female  PRE-OPERATIVE DIAGNOSIS:  symptomatic hardware  POST-OPERATIVE DIAGNOSIS:  symptomatic hardware    PROCEDURE:  Procedure(s) (LRB) with comments: LUMBAR SPINAL CORD STIMULATOR REMOVAL (N/A) - Spinal Cord Battery Removal  SURGEON:  Surgeon(s) and Role:    * Venita Lick, MD - Primary  PHYSICIAN ASSISTANT:      ASSISTANTS: none   ANESTHESIA:   general  EBL:  Total I/O In: 400 [I.V.:400] Out: -   BLOOD ADMINISTERED:none  DRAINS: none   LOCAL MEDICATIONS USED:  MARCAINE     SPECIMEN:  No Specimen  DISPOSITION OF SPECIMEN:  N/A  COUNTS:  YES  TOURNIQUET:  * No tourniquets in log *  DICTATION: .Other Dictation: Dictation Number [id number]  PLAN OF CARE: Discharge to home after PACU  PATIENT DISPOSITION:  PACU - hemodynamically stable.

## 2012-01-18 ENCOUNTER — Encounter (HOSPITAL_COMMUNITY): Payer: Self-pay | Admitting: Orthopedic Surgery

## 2012-01-18 NOTE — Op Note (Signed)
Tara [name], CIVELLO NO.:  [id number]  MEDICAL RECORD NO.:  [id number]  LOCATION:  [facility]                         FACILITY:  [facility]  PHYSICIAN:  Alvy Beal, MD    DATE OF BIRTH:  03/19/1965  DATE OF PROCEDURE:  01/17/2012 DATE OF DISCHARGE:  01/17/2012                              OPERATIVE REPORT   PREOPERATIVE DIAGNOSIS:  Symptomatic spinal cord stimulator battery.  POSTOPERATIVE DIAGNOSIS:  Symptomatic spinal cord stimulator battery.  OPERATIVE PROCEDURE:  Removal of symptomatic hardware.  COMPLICATIONS:  None.  CONDITION:  Stable.  HISTORY:  This is a very pleasant woman, who had several years   DICTATION ENDED AT THIS POINT.     Alvy Beal, MD     DDB/MEDQ  D:  01/17/2012  T:  01/18/2012  Job:  [id number]

## 2012-01-18 NOTE — Op Note (Signed)
Tara [name], Tara [name] NO.:  [id number]  MEDICAL RECORD NO.:  [id number]  LOCATION:  [facility]                         FACILITY:  [facility]  PHYSICIAN:  Alvy Beal, MD    DATE OF BIRTH:  1965-08-23  DATE OF PROCEDURE:  01/17/2012 DATE OF DISCHARGE:  01/17/2012                              OPERATIVE REPORT   PREOPERATIVE DIAGNOSIS:  Symptomatic hardware.  POSTOPERATIVE DIAGNOSIS:  Symptomatic hardware.  OPERATIVE PROCEDURE:  Removal of symptomatic hardware.  COMPLICATIONS:  None.  CONDITION:  Stable.  POSTOPERATIVE COMPLICATIONS:  None.  HISTORY:  This is a very pleasant woman who several years ago had a spinal cord stimulator placed.  Attempts at conservative management have failed to alleviate her symptoms.  After anesthetizing the joint site, she got excellent relief and so we elected to proceed with surgery.  All appropriate risks, benefits, and alternatives were discussed and consent was obtained.  DESCRIPTION OF PROCEDURE:  The patient was brought to the operating room, placed supine on the operating table.  After successful induction of general anesthesia and endotracheal intubation, TEDs, SCDs were applied.  She was turned lateral on the surgical table.  The battery site was prepped and draped in a standard fashion.  Appropriate time-out was then taken to confirm the patient, procedure, and all other pertinent important data.  Once this was completed, incision was made over the battery site and sharp dissection was carried out down to the battery.  The battery was delivered out of the surgical field and the wire was removed.  I then copiously irrigated the site and then closed in a layered fashion with interrupted #2-0 Vicryl sutures and a #3-0 Monocryl.  Steri-Strips and dry dressing were applied.  No intraoperative complications.  The patient was transferred to the PACU without incident.     Alvy Beal, MD     DDB/MEDQ  D:  01/17/2012   T:  01/18/2012  Job:  [id number]

## 2012-04-25 ENCOUNTER — Emergency Department: Payer: Self-pay | Admitting: Emergency Medicine

## 2012-04-29 ENCOUNTER — Ambulatory Visit: Payer: Self-pay | Admitting: Family Medicine

## 2012-04-30 ENCOUNTER — Ambulatory Visit: Payer: Self-pay | Admitting: General Surgery

## 2012-05-01 ENCOUNTER — Ambulatory Visit: Payer: Self-pay | Admitting: Family Medicine

## 2012-05-02 ENCOUNTER — Ambulatory Visit: Payer: Self-pay | Admitting: Gynecologic Oncology

## 2012-05-20 ENCOUNTER — Ambulatory Visit: Payer: Self-pay | Admitting: Gynecologic Oncology

## 2012-05-20 ENCOUNTER — Ambulatory Visit: Payer: Self-pay | Admitting: Hematology and Oncology

## 2012-06-10 ENCOUNTER — Ambulatory Visit: Payer: Self-pay | Admitting: Gynecologic Oncology

## 2012-07-14 ENCOUNTER — Emergency Department: Payer: Self-pay | Admitting: Emergency Medicine

## 2012-07-14 LAB — COMPREHENSIVE METABOLIC PANEL
BUN: 19 mg/dL — ABNORMAL HIGH (ref 7–18)
Co2: 25 mmol/L (ref 21–32)
Creatinine: 0.82 mg/dL (ref 0.60–1.30)
EGFR (Non-African Amer.): >60
Glucose: 114 mg/dL — ABNORMAL HIGH (ref 65–99)
Potassium: 4.3 mmol/L (ref 3.5–5.1)
SGOT(AST): 14 U/L — ABNORMAL LOW (ref 15–37)
Sodium: 137 mmol/L (ref 136–145)
Total Protein: 7.7 g/dL (ref 6.4–8.2)

## 2012-07-14 LAB — URINALYSIS, COMPLETE
Bacteria: NONE SEEN
Bilirubin,UR: NEGATIVE
Blood: NEGATIVE
Glucose,UR: NEGATIVE mg/dL (ref 0–75)
Specific Gravity: 1.03 (ref 1.003–1.030)
Squamous Epithelial: 10
WBC UR: 1 /HPF (ref 0–5)

## 2012-07-14 LAB — CBC
HCT: 39.8 % (ref 35.0–47.0)
HGB: 13.6 g/dL (ref 12.0–16.0)
MCHC: 34.2 g/dL (ref 32.0–36.0)
MCV: 96 fL (ref 80–100)
RBC: 4.16 10*6/uL (ref 3.80–5.20)
RDW: 12.9 % (ref 11.5–14.5)
WBC: 8 10*3/uL (ref 3.6–11.0)

## 2012-07-14 LAB — LIPASE, BLOOD: Lipase: 129 U/L (ref 73–393)

## 2012-07-23 ENCOUNTER — Ambulatory Visit: Payer: Self-pay | Admitting: Obstetrics and Gynecology

## 2012-07-23 LAB — CBC
HCT: 39.5 % (ref 35.0–47.0)
HGB: 13.7 g/dL (ref 12.0–16.0)
MCH: 33.2 pg (ref 26.0–34.0)
MCHC: 34.6 g/dL (ref 32.0–36.0)
MCV: 96 fL (ref 80–100)
Platelet: 297 10*3/uL (ref 150–440)
RDW: 13.1 % (ref 11.5–14.5)
WBC: 7 10*3/uL (ref 3.6–11.0)

## 2012-07-23 LAB — BASIC METABOLIC PANEL
BUN: 16 mg/dL (ref 7–18)
Calcium, Total: 8.8 mg/dL (ref 8.5–10.1)
Co2: 29 mmol/L (ref 21–32)
Creatinine: 0.73 mg/dL (ref 0.60–1.30)
Glucose: 83 mg/dL (ref 65–99)
Sodium: 138 mmol/L (ref 136–145)

## 2012-07-28 ENCOUNTER — Ambulatory Visit: Payer: Self-pay | Admitting: Obstetrics and Gynecology

## 2012-07-29 LAB — PATHOLOGY REPORT

## 2012-08-25 ENCOUNTER — Ambulatory Visit: Payer: Self-pay | Admitting: Hematology and Oncology

## 2012-10-08 ENCOUNTER — Ambulatory Visit: Payer: Self-pay | Admitting: Gastroenterology

## 2012-10-10 HISTORY — PX: OOPHORECTOMY: SHX86

## 2012-10-22 ENCOUNTER — Ambulatory Visit: Payer: Self-pay | Admitting: Gastroenterology

## 2012-10-24 LAB — PATHOLOGY REPORT

## 2013-01-06 ENCOUNTER — Ambulatory Visit: Payer: Self-pay | Admitting: Family Medicine

## 2013-02-02 ENCOUNTER — Ambulatory Visit: Payer: Self-pay | Admitting: Family Medicine

## 2013-02-02 LAB — TROPONIN I: Troponin-I: <0.02 ng/mL

## 2013-02-20 ENCOUNTER — Ambulatory Visit: Payer: Self-pay | Admitting: Family Medicine

## 2014-03-02 ENCOUNTER — Ambulatory Visit: Payer: Self-pay | Admitting: Family Medicine

## 2014-03-02 LAB — HM MAMMOGRAPHY

## 2014-03-16 LAB — LIPID PANEL
CHOLESTEROL: 234 mg/dL — AB (ref 0–200)
HDL: 46 mg/dL (ref 35–70)
LDL CALC: 164 mg/dL
LDL/HDL RATIO: 3.6
TRIGLYCERIDES: 119 mg/dL (ref 40–160)

## 2014-03-16 LAB — CBC AND DIFFERENTIAL
HCT: 41 % (ref 36–46)
Hemoglobin: 14.2 g/dL (ref 12.0–16.0)
Neutrophils Absolute: 3 /uL
Platelets: 327 10*3/uL (ref 150–399)
WBC: 6.8 10^3/mL

## 2014-03-16 LAB — BASIC METABOLIC PANEL
BUN: 15 mg/dL (ref 4–21)
Creatinine: 1 mg/dL (ref 0.5–1.1)
Glucose: 93 mg/dL
POTASSIUM: 4.6 mmol/L (ref 3.4–5.3)
Sodium: 143 mmol/L (ref 137–147)

## 2014-03-16 LAB — HEPATIC FUNCTION PANEL
ALK PHOS: 111 U/L (ref 25–125)
ALT: 12 U/L (ref 7–35)
AST: 16 U/L (ref 13–35)
BILIRUBIN, TOTAL: 0.5 mg/dL

## 2014-03-16 LAB — TSH: TSH: 1.48 u[IU]/mL (ref 0.41–5.90)

## 2014-04-21 ENCOUNTER — Ambulatory Visit: Payer: Self-pay | Admitting: Gastroenterology

## 2014-04-21 LAB — HM COLONOSCOPY

## 2014-07-02 NOTE — Op Note (Signed)
PATIENT NAME:  Tara [name], Tara [name] MR#:  [id number] DATE OF BIRTH:  04/13/1965  DATE OF PROCEDURE:  07/28/2012  PREOPERATIVE DIAGNOSES: 1.  Chronic right lower quadrant pain.  2.  History of endometriosis.  3.  Family history of ovarian cancer.   POSTOPERATIVE DIAGNOSES: 1.  Chronic right lower quadrant pain.  2.  History of endometriosis.  3.  Family history of ovarian cancer.  4.  Pelvic adhesive disease.   OPERATIVE PROCEDURE: Laparoscopic right salpingo-oophorectomy and adhesiolysis.   SURGEON: Prentice DockerMartin A. DeFrancesco, M.D.   FIRST ASSISTANT: Earlie LouLindsay Overton, FN-P  ANESTHESIA: General endotracheal.   INDICATIONS: The patient is a 49 year old white female with long history of chronic pelvic pain, previously status post laparoscopic left oophorectomy and hysterectomy, who presents for surgical management of chronic pelvic pain in right lower quadrant. The patient does have family history of ovarian cancer. She desires removal of the right tube and ovary.   FINDINGS AT SURGERY: Included a multicystic right ovary with some right pelvic sidewall adhesions to the tube and ovary complex.  These were taken down with sharp dissection. The bowel and omentum were stuck down in the cul-de-sac and adhesiolysis was performed. The upper abdomen looked grossly normal. The ureters were noted to be in normal anatomic location and with normal function, both pre and post oophorectomy.  DESCRIPTION OF PROCEDURE: The patient was brought to the operating room where she was placed in the supine position. General endotracheal anesthesia was induced without difficulty. She was placed in the dorsal lithotomy position using the bumblebee stirrups. A ChloraPrep and Betadine abdominal, perineal, and intravaginal prep and drape was performed in the standard fashion. A red Robinson catheter was used to drain 600 mL of urine from the bladder. A sponge stick was placed into the vagina. A subumbilical vertical 5 mm incision  was placed and direct entry was made with the Optiview laparoscopic trocar system. No bowel or vascular injury was encountered. A 10 mm port was placed in the right lower quadrant under direct visualization. A 5 mm port was placed in the suprapubic region under direct visualization. The above-noted findings were photo documented. The right tube and ovary complex was elevated. It was clearly away from the functioning ureter. The infundibulopelvic ligament was crossclamped, coagulated, and cut using the Harmonic scalpel. The mesosalpinx was likewise clamped, coagulated, and cut. This was carried out through the entire length of the tube and ovary complex to the region of the right round ligament which was densely adherent to the cuff. This area of adhesion was mobilized using the Harmonic scalpel. The adnexa was then removed from the operative field. Good hemostasis was noted. Minimal Kleppinger bipolar cautery was used to optimize hemostasis. The adhesions that were noted in the vaginal cuff and the left pelvic sidewall were taken down using the Ace Harmonic scalpel along with traction and blunt dissection with the grasping forceps. Once adhesions were taken down the pelvis was irrigated and the irrigant fluid was aspirated. Good hemostasis was noted. The procedure was then terminated with all instrumentation being removed from the pelvis. The pneumoperitoneum was released. The incisions were closed with 0 Vicryl on the fascia, of the 11 mm port site. Subcuticular stitches of 4-0 Vicryl were placed and Dermabond was used over these. The patient was then awakened, extubated, and taken to the recovery room in satisfactory condition. Estimated blood loss was minimal. Complications were none. All instrument, needle, and sponge counts were verified as correct. No antibiotic prophylaxis was given.  ____________________________  Martin A. DeFrancesco, MD mad:sb D: 07/28/2012 13:42:07 ET T: 07/28/2012 14:37:23  ET JOB#: [id number]  cc: Daphine Deutscher A. DeFrancesco, MD, <Dictator> Prentice Docker DEFRANCESCO MD ELECTRONICALLY SIGNED 08/19/2012 15:03

## 2014-07-05 LAB — SURGICAL PATHOLOGY

## 2014-08-11 ENCOUNTER — Other Ambulatory Visit: Payer: Self-pay | Admitting: Family Medicine

## 2014-08-18 IMAGING — US US GALLBLADDER-BILIARY (RUQ)
1 series · 14 of 25 positions shown · non-contrast
Comparison: None.

CLINICAL DATA: Abdominal pain with nausea

EXAM:
US ABDOMEN LIMITED - RIGHT UPPER QUADRANT

[Series 1: us gallbladder-biliary (ruq) · 0.28mm/px · 14 of 43 slices shown]
[im 1/43]
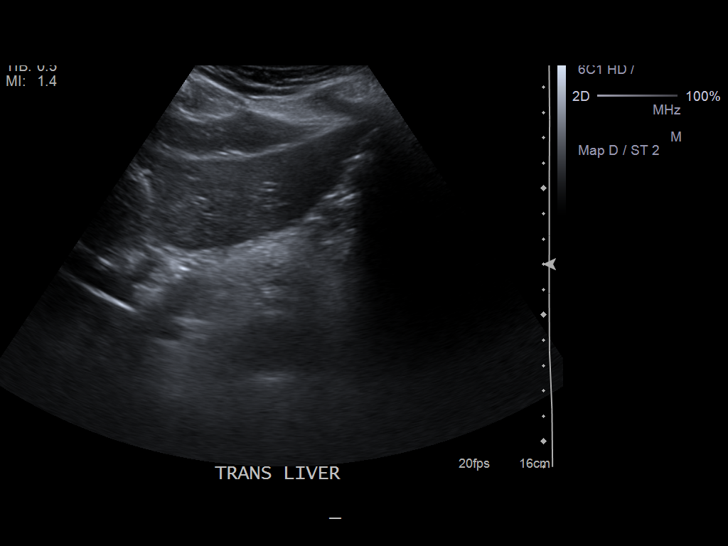
[im 4/43]
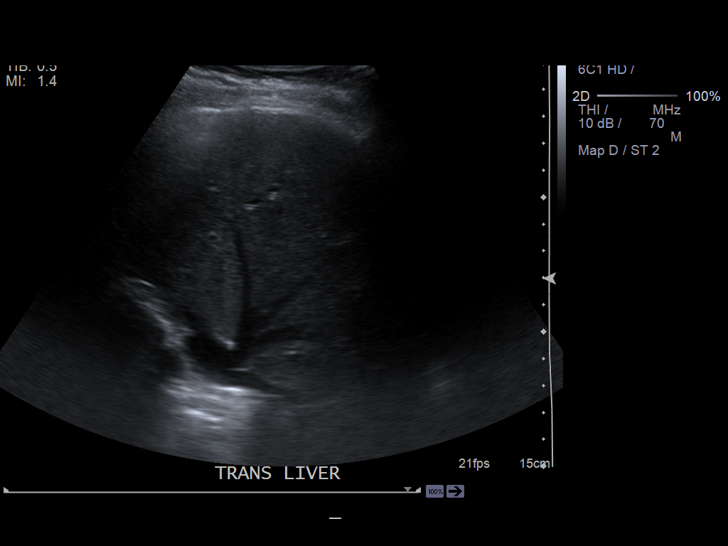
[im 8/43]
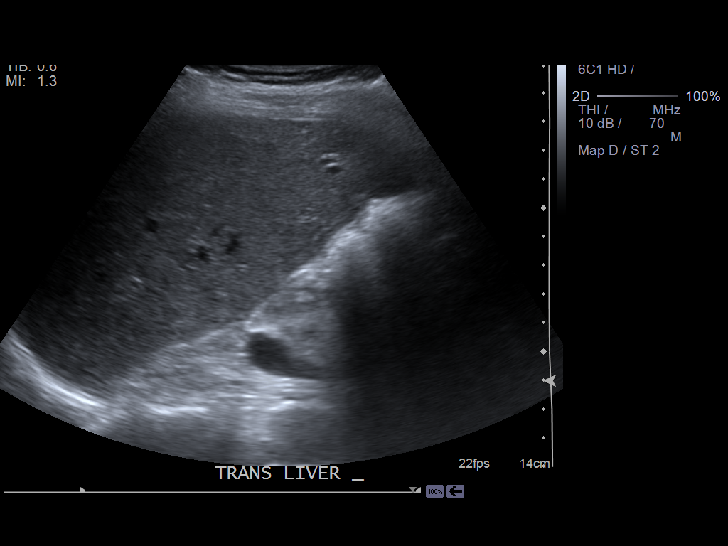
[im 11/43]
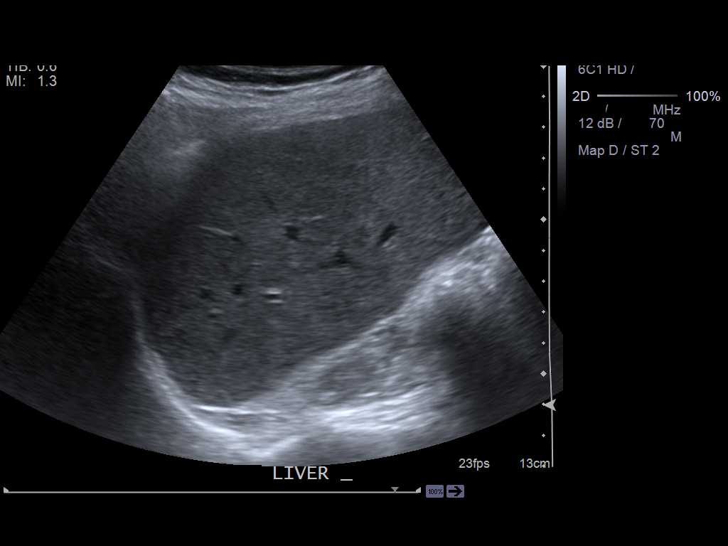
[im 15/43]
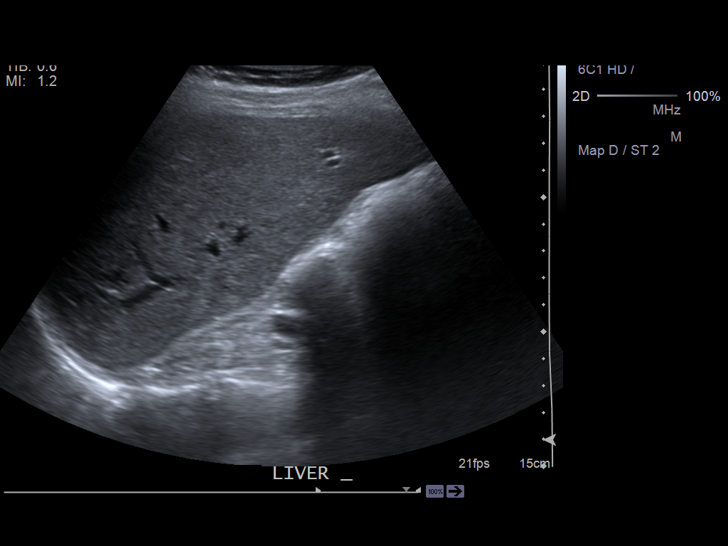
[im 16/43]
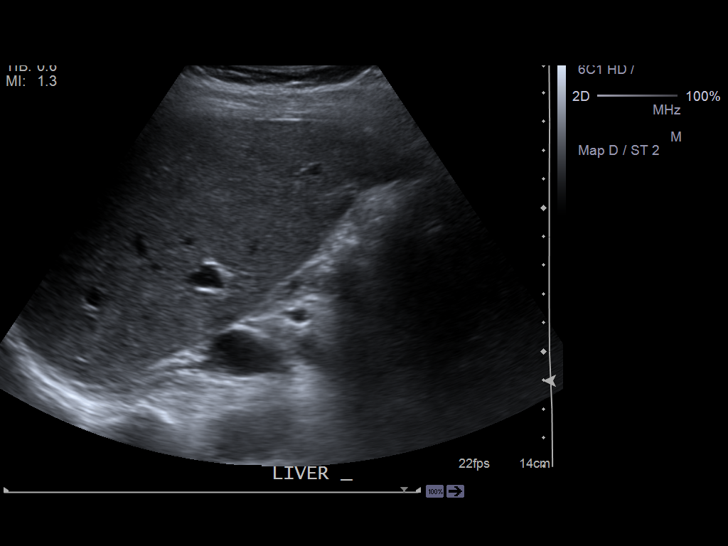
[im 20/43]
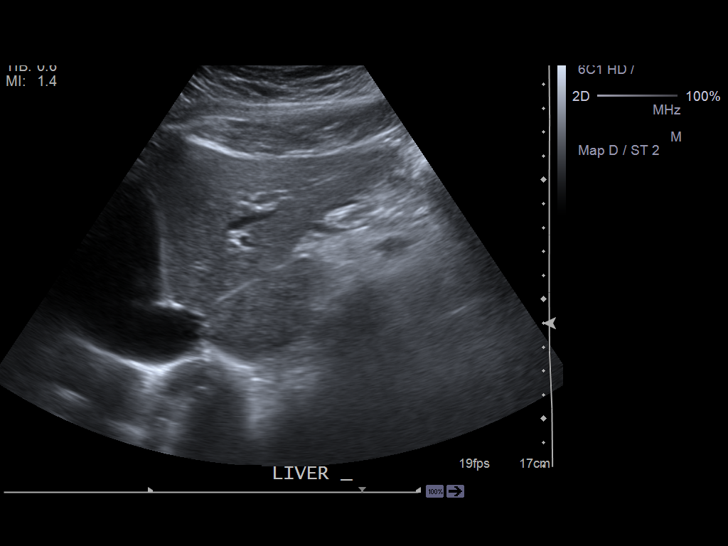
[im 23/43]
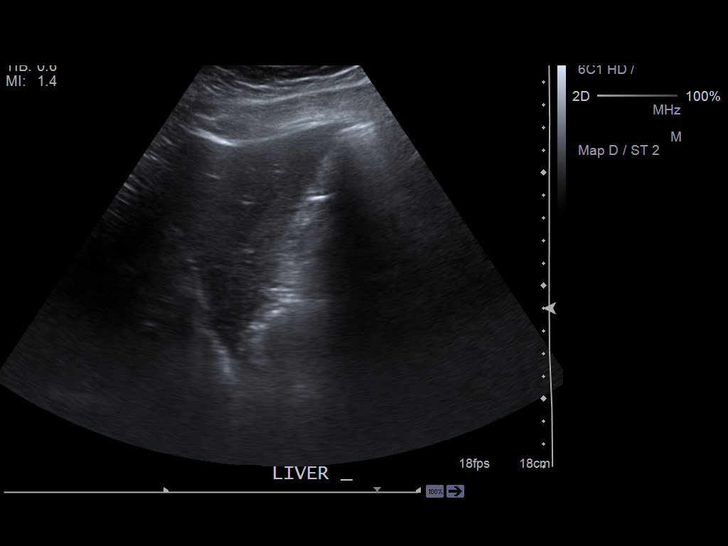
[im 27/43]
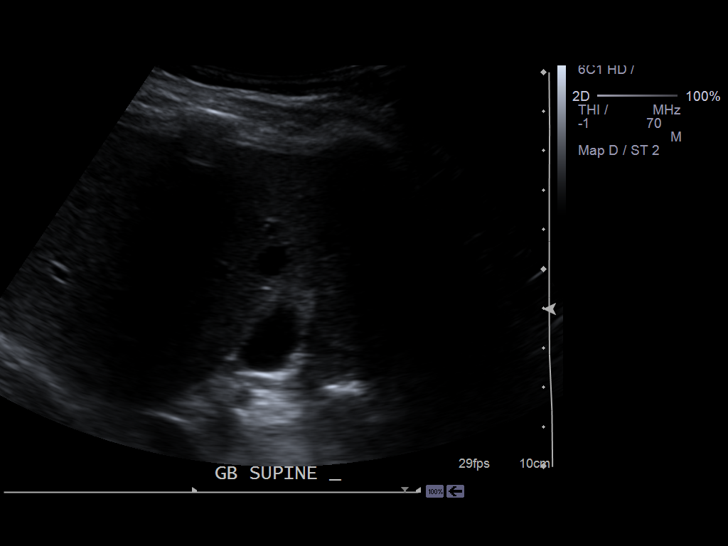
[im 29/43]
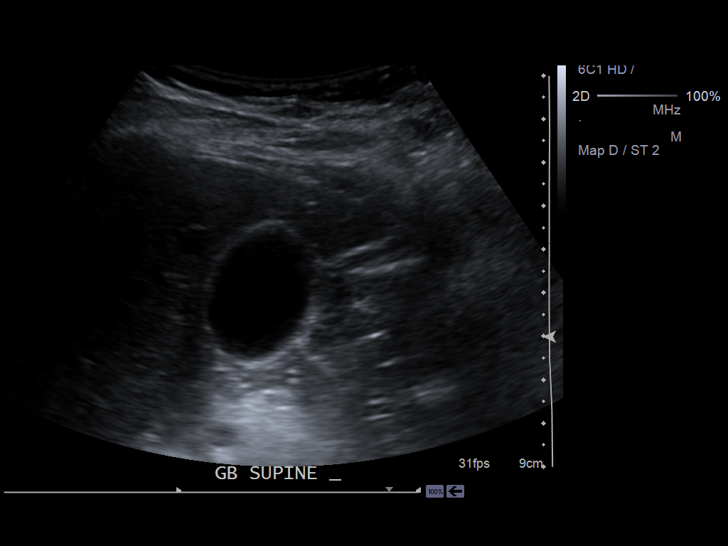
[im 32/43]
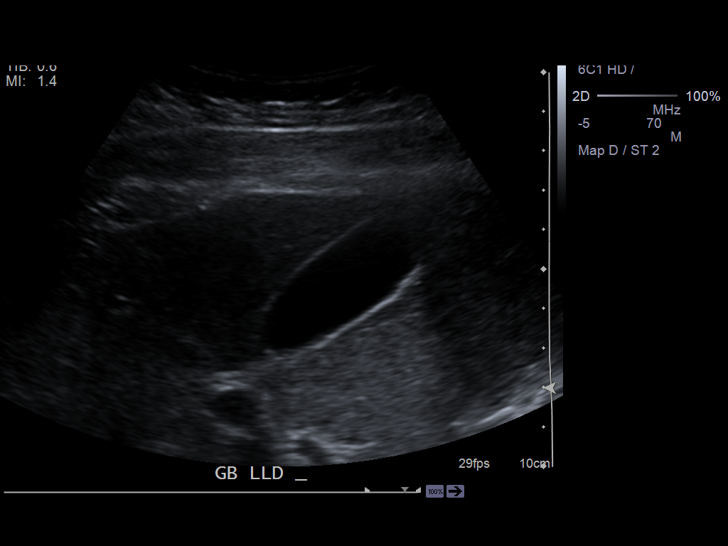
[im 36/43]
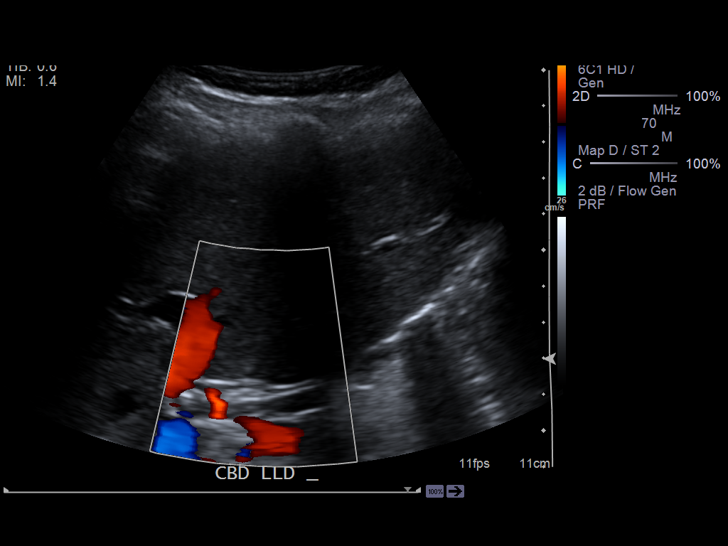
[im 39/43]
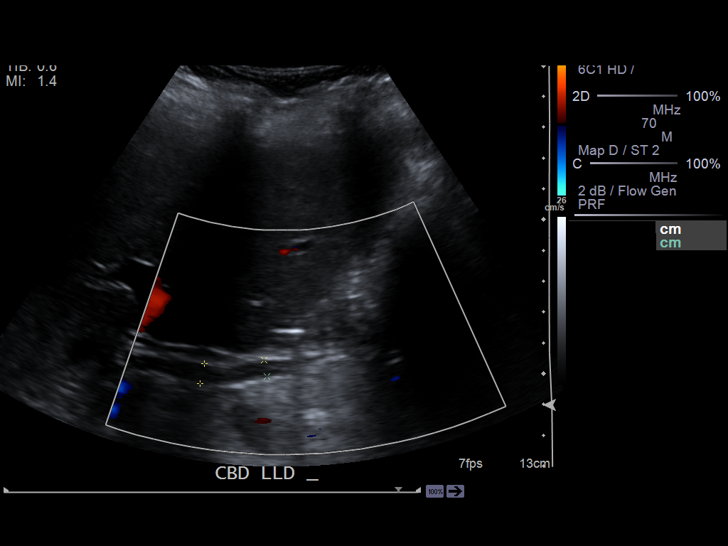
[im 43/43]
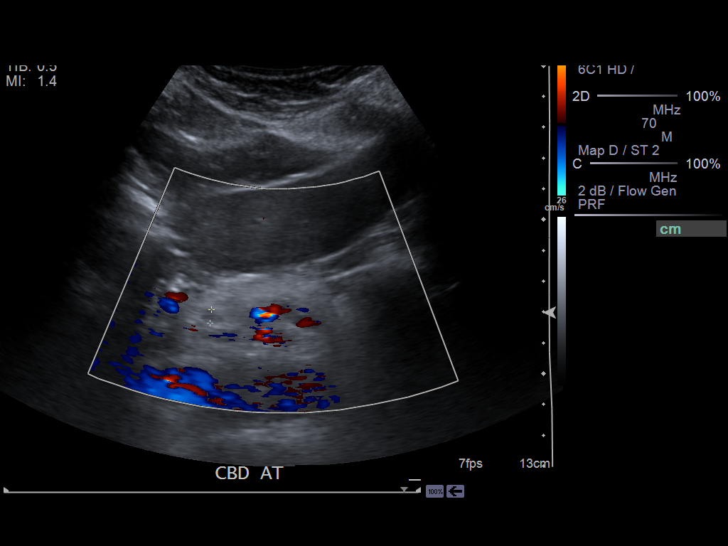

[14 of 25 positions shown; findings below may reference images not displayed]

FINDINGS: Gallbladder

No gallstones or wall thickening visualized. There is no
pericholecystic fluid. No sonographic Murphy sign noted.

Common bile duct

Diameter: 4 mm. There is no intrahepatic or extrahepatic biliary
duct dilatation.

Liver:

No focal lesion identified. Within normal limits in parenchymal
echogenicity. Liver is prominent measuring 15.9 cm in length.
IMPRESSION: Liver prominent but otherwise normal in appearance. Study otherwise
within normal limits.

## 2014-08-20 ENCOUNTER — Other Ambulatory Visit: Payer: Self-pay | Admitting: Family Medicine

## 2014-08-20 ENCOUNTER — Telehealth: Payer: Self-pay | Admitting: Family Medicine

## 2014-08-20 MED ORDER — RANITIDINE HCL 300 MG PO TABS: 300.0000 mg | ORAL_TABLET | Freq: Every day | ORAL | Status: DC

## 2014-08-20 NOTE — Telephone Encounter (Signed)
She needs to make an appointment, I will send one month

## 2014-08-20 NOTE — Telephone Encounter (Signed)
Patient notified and is on waiting list for cancellation to be seen.

## 2014-08-20 NOTE — Telephone Encounter (Signed)
Patient is requesting a refill on ranitidine. Please send to [email or web address]. Please return call on mobile once this is complete [phone]

## 2014-08-20 NOTE — Telephone Encounter (Signed)
Make sure she gets on the schedule for when I have an opening

## 2014-09-02 NOTE — Telephone Encounter (Signed)
Patient is on the schedule for July 1 @ 8A.

## 2014-09-04 ENCOUNTER — Other Ambulatory Visit: Payer: Self-pay | Admitting: Family Medicine

## 2014-09-08 ENCOUNTER — Encounter: Payer: Self-pay | Admitting: Family Medicine

## 2014-09-08 DIAGNOSIS — J3089 Other allergic rhinitis: Secondary | ICD-10-CM | POA: Insufficient documentation

## 2014-09-08 DIAGNOSIS — M51379 Other intervertebral disc degeneration, lumbosacral region without mention of lumbar back pain or lower extremity pain: Secondary | ICD-10-CM | POA: Insufficient documentation

## 2014-09-08 DIAGNOSIS — J449 Chronic obstructive pulmonary disease, unspecified: Secondary | ICD-10-CM | POA: Insufficient documentation

## 2014-09-08 DIAGNOSIS — K219 Gastro-esophageal reflux disease without esophagitis: Secondary | ICD-10-CM | POA: Insufficient documentation

## 2014-09-08 DIAGNOSIS — D72829 Elevated white blood cell count, unspecified: Secondary | ICD-10-CM | POA: Insufficient documentation

## 2014-09-08 DIAGNOSIS — G549 Nerve root and plexus disorder, unspecified: Secondary | ICD-10-CM | POA: Insufficient documentation

## 2014-09-08 DIAGNOSIS — G4700 Insomnia, unspecified: Secondary | ICD-10-CM | POA: Insufficient documentation

## 2014-09-08 DIAGNOSIS — F339 Major depressive disorder, recurrent, unspecified: Secondary | ICD-10-CM | POA: Insufficient documentation

## 2014-09-08 DIAGNOSIS — F17200 Nicotine dependence, unspecified, uncomplicated: Secondary | ICD-10-CM | POA: Insufficient documentation

## 2014-09-08 DIAGNOSIS — G9050 Complex regional pain syndrome I, unspecified: Secondary | ICD-10-CM | POA: Insufficient documentation

## 2014-09-08 DIAGNOSIS — R1310 Dysphagia, unspecified: Secondary | ICD-10-CM | POA: Insufficient documentation

## 2014-09-08 DIAGNOSIS — M533 Sacrococcygeal disorders, not elsewhere classified: Secondary | ICD-10-CM | POA: Insufficient documentation

## 2014-09-08 DIAGNOSIS — M5137 Other intervertebral disc degeneration, lumbosacral region: Secondary | ICD-10-CM | POA: Insufficient documentation

## 2014-09-10 ENCOUNTER — Ambulatory Visit (INDEPENDENT_AMBULATORY_CARE_PROVIDER_SITE_OTHER): Payer: BC Managed Care – PPO | Admitting: Family Medicine

## 2014-09-10 ENCOUNTER — Encounter: Payer: Self-pay | Admitting: Family Medicine

## 2014-09-10 VITALS — BP 122/76 | HR 72 | Temp 97.7°F | Resp 16 | Ht 68.5 in | Wt 169.9 lb

## 2014-09-10 DIAGNOSIS — G4700 Insomnia, unspecified: Secondary | ICD-10-CM | POA: Diagnosis not present

## 2014-09-10 DIAGNOSIS — K2100 Gastro-esophageal reflux disease with esophagitis, without bleeding: Secondary | ICD-10-CM

## 2014-09-10 DIAGNOSIS — F418 Other specified anxiety disorders: Secondary | ICD-10-CM | POA: Diagnosis not present

## 2014-09-10 DIAGNOSIS — K2270 Barrett's esophagus without dysplasia: Secondary | ICD-10-CM | POA: Insufficient documentation

## 2014-09-10 DIAGNOSIS — F329 Major depressive disorder, single episode, unspecified: Secondary | ICD-10-CM

## 2014-09-10 DIAGNOSIS — J309 Allergic rhinitis, unspecified: Secondary | ICD-10-CM | POA: Diagnosis not present

## 2014-09-10 DIAGNOSIS — F419 Anxiety disorder, unspecified: Secondary | ICD-10-CM

## 2014-09-10 DIAGNOSIS — J3089 Other allergic rhinitis: Secondary | ICD-10-CM

## 2014-09-10 MED ORDER — VENLAFAXINE HCL ER 75 MG PO CP24: 75.0000 mg | ORAL_CAPSULE | Freq: Every day | ORAL | Status: DC

## 2014-09-10 MED ORDER — CLONAZEPAM 1 MG PO TABS: 1.0000 mg | ORAL_TABLET | Freq: Two times a day (BID) | ORAL | Status: DC | PRN

## 2014-09-10 MED ORDER — RANITIDINE HCL 300 MG PO CAPS: 300.0000 mg | ORAL_CAPSULE | Freq: Every evening | ORAL | Status: DC

## 2014-09-10 MED ORDER — OMEPRAZOLE 40 MG PO CPDR: 80.0000 mg | DELAYED_RELEASE_CAPSULE | Freq: Every day | ORAL | Status: DC

## 2014-09-10 MED ORDER — MONTELUKAST SODIUM 10 MG PO TABS: 10.0000 mg | ORAL_TABLET | Freq: Every day | ORAL | Status: DC

## 2014-09-10 NOTE — Progress Notes (Signed)
Name: Tara [name]   MRN: [id number]    DOB: [birth year]   Date:09/10/2014       Progress Note  Subjective  Chief Complaint  Chief Complaint  Patient presents with  . Medication Refill    6 month F/U  . Gastrophageal Reflux    Barrett esophagus-Rantidine has improved stomach pain but still having burning in throat.  . Insomnia    unchanged- 3-6 hour total due to chronic back pain trouble getting comfortable and falling asleep  . Hot Flashes    Effexor helped with symptoms    HPI  GERD/Esophagitis and Barrett's: she had EGD done 04/2014, showed Barrett's and esophagitis -she continues to have symptoms. She takes Omeprazole 80mg  in am and Ranitidine 300mg  at night, still has daily symptoms either burning or a lump sensation in her throat. She drinks 4 cups of coffee daily and at least 3 glasses of sweet tea daily. She avoids citric food  Insomnia: takes Clonazepam prn and is doing well.  Overweight: gaining weight, explained importance of walking more, eat healthy , and avoid naps  Hot Flashes/Depression/Anxiety: taking Effexor, stopped zoloft, but has been feeling tired. She states not feeling sad, no suicidal thoughts or ideation, but states hot flashes are under control.    Patient Active Problem List   Diagnosis Date Noted  . Anxiety and depression 09/08/2014  . Insomnia, persistent 09/08/2014  . Arthralgia, sacroiliac 09/08/2014  . Current smoker 09/08/2014  . Degeneration of lumbar or lumbosacral intervertebral disc 09/08/2014  . Gastro-esophageal reflux disease without esophagitis 09/08/2014  . Elevated WBC count 09/08/2014  . Perennial allergic rhinitis 09/08/2014  . Nerve root disorder 09/08/2014  . Reflex sympathetic dystrophy 09/08/2014  . Retained orthopedic hardware 01/15/2012  . Chronic pain 01/25/2011    Past Surgical History  Procedure Laterality Date  . Abdominal hysterectomy  2005    partial  . Tubal ligation  1988  . Cervical disc arthroplasty  2008   . Knee arthroscopy Right 2008  . Spinal cord stimulator implant  2009    Removal-01/2012  . Oophorectomy Left 2011  . Lumbar laminectomy/decompression microdiscectomy  01/25/2011    Procedure: LUMBAR LAMINECTOMY/DECOMPRESSION MICRODISCECTOMY;  Surgeon: Alvy Bealahari D Brooks;  Location: [facility] OR;  Service: Orthopedics;  Laterality: Left;  LEFT L5-S1 MICRODISCECTOMY, Central Decompression Lumbar five-sacral one  . Spinal cord stimulator removal  01/17/2012    Procedure: LUMBAR SPINAL CORD STIMULATOR REMOVAL;  Surgeon: Venita Lickahari Brooks, MD;  Location: [facility] OR;  Service: Orthopedics;  Laterality: N/A;  Spinal Cord Battery Removal  . Oophorectomy Right 10/2012    Family History  Problem Relation Age of Onset  . Depression Mother   . Ovarian cancer Mother     History   Social History  . Marital Status: Divorced    Spouse Name: N/A  . Number of Children: N/A  . Years of Education: N/A   Occupational History  .  Unemployed   Social History Main Topics  . Smoking status: Current Every Day Smoker -- 1.00 packs/day for 28 years    Types: Cigarettes    Start date: 09/10/1986  . Smokeless tobacco: Never Used  . Alcohol Use: No  . Drug Use: No  . Sexual Activity:    Partners: Male   Other Topics Concern  . Not on file   Social History Narrative     Current outpatient prescriptions:  .  clonazePAM (KLONOPIN) 1 MG tablet, Take 1 tablet (1 mg total) by mouth 2 (two) times daily as needed.,  Disp: 60 tablet, Rfl: 0 .  diclofenac (FLECTOR) 1.3 % PTCH, Place 1 patch onto the skin 2 (two) times daily as needed. For pain, Disp: , Rfl:  .  lidocaine (LIDODERM) 5 %, Place 1 patch onto the skin daily as needed. For pain. Remove & Discard patch within 12 hours, Disp: , Rfl:  .  loratadine (CLARITIN) 10 MG tablet, Take 1 tablet by mouth daily., Disp: , Rfl:  .  methocarbamol (ROBAXIN) 500 MG tablet, Take 1 tablet by mouth at bedtime., Disp: , Rfl:  .  montelukast (SINGULAIR) 10 MG tablet, Take 1 tablet (10  mg total) by mouth daily., Disp: 30 tablet, Rfl: 5 .  omeprazole (PRILOSEC) 40 MG capsule, Take 2 capsules (80 mg total) by mouth daily., Disp: 60 capsule, Rfl: 5 .  zolpidem (AMBIEN) 10 MG tablet, Take 10 mg by mouth at bedtime. , Disp: , Rfl:  .  ranitidine (ZANTAC) 300 MG capsule, Take 1 capsule (300 mg total) by mouth every evening., Disp: 30 capsule, Rfl: 5 .  venlafaxine XR (EFFEXOR XR) 75 MG 24 hr capsule, Take 1 capsule (75 mg total) by mouth daily with breakfast., Disp: 30 capsule, Rfl: 5  Allergies  Allergen Reactions  . Clonidine Derivatives Itching     ROS  Constitutional: Negative for fever or weight change. She has fatigue Respiratory: Negative for cough and shortness of breath.   Cardiovascular: Negative for chest pain or palpitations.  Gastrointestinal: Negative for abdominal pain, no bowel changes.  Musculoskeletal: Negative for gait problem or joint swelling.  Skin: Negative for rash.  Neurological: Negative for dizziness or headache.  No other specific complaints in a complete review of systems (except as listed in HPI above).  Objective  Filed Vitals:   09/10/14 1206  BP: 122/76  Pulse: 72  Temp: 97.7 F (36.5 C)  TempSrc: Oral  Resp: 16  Height: 5' 8.5" (1.74 m)  Weight: 169 lb 14.4 oz (77.066 kg)  SpO2: 98%    Body mass index is 25.45 kg/(m^2).  Physical Exam   Constitutional: Patient appears well-developed and well-nourished. No distress.  Eyes:  No scleral icterus. PERL Neck: Normal range of motion. Neck supple. Cardiovascular: Normal rate, regular rhythm and normal heart sounds.  No murmur heard. No BLE edema. Pulmonary/Chest: Effort normal and breath sounds normal. No respiratory distress. Abdominal: Soft.  There is no tenderness. Psychiatric: Patient has a normal mood and affect. behavior is normal. Judgment and thought content normal. Muscular Skeletal: weaker on right arm, grip 5/5 but weaker on right    PHQ2/9: Depression screen PHQ  2/9 09/10/2014  Decreased Interest 0  Down, Depressed, Hopeless 0  PHQ - 2 Score 0     Fall Risk: Fall Risk  09/10/2014  Falls in the past year? No    Assessment & Plan   1. Barrett esophagus Continue EGD every three years  2. Gastroesophageal reflux disease with esophagitis Discussed importance of avoiding caffeine in her diet and she will try it - ranitidine (ZANTAC) 300 MG capsule; Take 1 capsule (300 mg total) by mouth every evening.  Dispense: 30 capsule; Refill: 5  3. Insomnia, persistent Continue prn medication  4. Anxiety and depression Increase dose of Effexor to help with depression and anxiety and try taking it in am's to increase energy - venlafaxine XR (EFFEXOR XR) 75 MG 24 hr capsule; Take 1 capsule (75 mg total) by mouth daily with breakfast.  Dispense: 30 capsule; Refill: 5 - clonazePAM (KLONOPIN) 1 MG tablet; Take  1 tablet (1 mg total) by mouth 2 (two) times daily as needed.  Dispense: 60 tablet; Refill: 0  5. Perennial allergic rhinitis Doing medication, under control now - montelukast (SINGULAIR) 10 MG tablet; Take 1 tablet (10 mg total) by mouth daily.  Dispense: 30 tablet; Refill: 5

## 2014-11-06 ENCOUNTER — Other Ambulatory Visit: Payer: Self-pay | Admitting: Family Medicine

## 2015-01-04 ENCOUNTER — Ambulatory Visit: Payer: BC Managed Care – PPO | Admitting: Family Medicine

## 2015-01-18 ENCOUNTER — Telehealth: Payer: Self-pay

## 2015-01-18 ENCOUNTER — Encounter: Payer: Self-pay | Admitting: Family Medicine

## 2015-01-18 ENCOUNTER — Ambulatory Visit (INDEPENDENT_AMBULATORY_CARE_PROVIDER_SITE_OTHER): Payer: BC Managed Care – PPO | Admitting: Family Medicine

## 2015-01-18 VITALS — BP 118/76 | HR 78 | Temp 98.9°F | Resp 16 | Wt 169.7 lb

## 2015-01-18 DIAGNOSIS — E785 Hyperlipidemia, unspecified: Secondary | ICD-10-CM

## 2015-01-18 DIAGNOSIS — R1011 Right upper quadrant pain: Secondary | ICD-10-CM | POA: Diagnosis not present

## 2015-01-18 DIAGNOSIS — K2270 Barrett's esophagus without dysplasia: Secondary | ICD-10-CM

## 2015-01-18 DIAGNOSIS — Z79899 Other long term (current) drug therapy: Secondary | ICD-10-CM | POA: Diagnosis not present

## 2015-01-18 DIAGNOSIS — F339 Major depressive disorder, recurrent, unspecified: Secondary | ICD-10-CM

## 2015-01-18 DIAGNOSIS — Z23 Encounter for immunization: Secondary | ICD-10-CM | POA: Diagnosis not present

## 2015-01-18 DIAGNOSIS — D72829 Elevated white blood cell count, unspecified: Secondary | ICD-10-CM

## 2015-01-18 DIAGNOSIS — R1013 Epigastric pain: Secondary | ICD-10-CM | POA: Diagnosis not present

## 2015-01-18 DIAGNOSIS — K219 Gastro-esophageal reflux disease without esophagitis: Secondary | ICD-10-CM | POA: Diagnosis not present

## 2015-01-18 DIAGNOSIS — R110 Nausea: Secondary | ICD-10-CM | POA: Diagnosis not present

## 2015-01-18 DIAGNOSIS — G4700 Insomnia, unspecified: Secondary | ICD-10-CM

## 2015-01-18 MED ORDER — PROMETHAZINE HCL 12.5 MG PO TABS: 12.5000 mg | ORAL_TABLET | Freq: Three times a day (TID) | ORAL | Status: DC | PRN

## 2015-01-18 MED ORDER — CLONAZEPAM 1 MG PO TABS: 1.0000 mg | ORAL_TABLET | Freq: Two times a day (BID) | ORAL | Status: DC | PRN

## 2015-01-18 MED ORDER — VENLAFAXINE HCL ER 150 MG PO CP24: 150.0000 mg | ORAL_CAPSULE | Freq: Every day | ORAL | Status: DC

## 2015-01-18 MED ORDER — DEXLANSOPRAZOLE 60 MG PO CPDR: 60.0000 mg | DELAYED_RELEASE_CAPSULE | Freq: Every day | ORAL | Status: DC

## 2015-01-18 NOTE — Addendum Note (Signed)
Addended by: Alba CorySOWLES, Jaedah Lords F on: 01/18/2015 02:58 PM   Modules accepted: Orders

## 2015-01-18 NOTE — Telephone Encounter (Signed)
Patient is requesting a medication for her being nausea. Please send to pharmacy.
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
## 2015-01-26 ENCOUNTER — Other Ambulatory Visit: Payer: Self-pay

## 2015-01-28 ENCOUNTER — Encounter: Payer: Self-pay | Admitting: Family Medicine

## 2015-01-28 ENCOUNTER — Telehealth: Payer: Self-pay | Admitting: Family Medicine

## 2015-01-28 ENCOUNTER — Ambulatory Visit
Admission: RE | Admit: 2015-01-28 | Discharge: 2015-01-28 | Disposition: A | Discharge status: Home / Self Care | Payer: BC Managed Care – PPO | Source: Ambulatory Visit | Attending: Family Medicine | Admitting: Family Medicine

## 2015-01-28 DIAGNOSIS — R1011 Right upper quadrant pain: Secondary | ICD-10-CM | POA: Diagnosis present

## 2015-01-28 DIAGNOSIS — R110 Nausea: Secondary | ICD-10-CM | POA: Insufficient documentation

## 2015-01-28 DIAGNOSIS — K760 Fatty (change of) liver, not elsewhere classified: Secondary | ICD-10-CM | POA: Insufficient documentation

## 2015-01-28 NOTE — Telephone Encounter (Signed)
PLEASE CALL PT BACK . SHE SAID THAT SHE HAS ANOTHER QUESTION

## 2015-02-09 LAB — COMPREHENSIVE METABOLIC PANEL
A/G RATIO: 1.4 (ref 1.1–2.5)
ALBUMIN: 4.2 g/dL (ref 3.5–5.5)
ALT: 14 IU/L (ref 0–32)
AST: 16 IU/L (ref 0–40)
Alkaline Phosphatase: 129 IU/L — ABNORMAL HIGH (ref 39–117)
BUN / CREAT RATIO: 18 (ref 9–23)
BUN: 16 mg/dL (ref 6–24)
Bilirubin Total: 0.2 mg/dL (ref 0.0–1.2)
CALCIUM: 9.5 mg/dL (ref 8.7–10.2)
CO2: 26 mmol/L (ref 18–29)
CREATININE: 0.9 mg/dL (ref 0.57–1.00)
Chloride: 101 mmol/L (ref 97–106)
GFR calc Af Amer: 86 mL/min/{1.73_m2} (ref >59)
GFR, EST NON AFRICAN AMERICAN: 74 mL/min/{1.73_m2} (ref >59)
GLOBULIN, TOTAL: 3 g/dL (ref 1.5–4.5)
Glucose: 85 mg/dL (ref 65–99)
POTASSIUM: 4.8 mmol/L (ref 3.5–5.2)
SODIUM: 141 mmol/L (ref 136–144)
Total Protein: 7.2 g/dL (ref 6.0–8.5)

## 2015-02-09 LAB — LIPID PANEL
CHOL/HDL RATIO: 4.8 ratio — AB (ref 0.0–4.4)
Cholesterol, Total: 206 mg/dL — ABNORMAL HIGH (ref 100–199)
HDL: 43 mg/dL (ref >39)
LDL CALC: 146 mg/dL — AB (ref 0–99)
TRIGLYCERIDES: 87 mg/dL (ref 0–149)
VLDL Cholesterol Cal: 17 mg/dL (ref 5–40)

## 2015-02-09 LAB — CBC WITH DIFFERENTIAL/PLATELET
BASOS ABS: 0.1 10*3/uL (ref 0.0–0.2)
Basos: 1 %
EOS (ABSOLUTE): 0.6 10*3/uL — AB (ref 0.0–0.4)
Eos: 7 %
Hematocrit: 42 % (ref 34.0–46.6)
Hemoglobin: 13.9 g/dL (ref 11.1–15.9)
IMMATURE GRANS (ABS): 0 10*3/uL (ref 0.0–0.1)
IMMATURE GRANULOCYTES: 0 %
LYMPHS: 33 %
Lymphocytes Absolute: 2.7 10*3/uL (ref 0.7–3.1)
MCH: 32 pg (ref 26.6–33.0)
MCHC: 33.1 g/dL (ref 31.5–35.7)
MCV: 97 fL (ref 79–97)
MONOS ABS: 0.7 10*3/uL (ref 0.1–0.9)
Monocytes: 9 %
NEUTROS PCT: 50 %
Neutrophils Absolute: 4.2 10*3/uL (ref 1.4–7.0)
PLATELETS: 347 10*3/uL (ref 150–379)
RBC: 4.35 x10E6/uL (ref 3.77–5.28)
RDW: 13.1 % (ref 12.3–15.4)
WBC: 8.2 10*3/uL (ref 3.4–10.8)

## 2015-02-09 LAB — COMMENT

## 2015-02-09 LAB — H. PYLORI ANTIBODY, IGG

## 2015-02-09 LAB — LIPASE: LIPASE: 19 U/L (ref 0–59)

## 2015-02-22 ENCOUNTER — Encounter: Payer: BC Managed Care – PPO | Admitting: Family Medicine

## 2015-03-12 ENCOUNTER — Other Ambulatory Visit: Payer: Self-pay | Admitting: Family Medicine

## 2015-03-28 ENCOUNTER — Other Ambulatory Visit: Payer: Self-pay | Admitting: Gastroenterology

## 2015-03-28 DIAGNOSIS — R140 Abdominal distension (gaseous): Secondary | ICD-10-CM

## 2015-03-28 DIAGNOSIS — K219 Gastro-esophageal reflux disease without esophagitis: Secondary | ICD-10-CM

## 2015-04-01 ENCOUNTER — Encounter: Admission: RE | Admit: 2015-04-01 | Payer: BC Managed Care – PPO | Source: Ambulatory Visit

## 2015-04-14 ENCOUNTER — Encounter: Payer: BC Managed Care – PPO | Admitting: Family Medicine

## 2015-04-21 ENCOUNTER — Other Ambulatory Visit: Payer: Self-pay | Admitting: Family Medicine

## 2015-04-21 NOTE — Telephone Encounter (Signed)
Patient requesting refill. 

## 2015-06-01 ENCOUNTER — Other Ambulatory Visit: Payer: Self-pay

## 2015-06-01 MED ORDER — VENLAFAXINE HCL ER 150 MG PO CP24: ORAL_CAPSULE | ORAL | Status: DC

## 2015-06-01 NOTE — Telephone Encounter (Signed)
Patient requesting refill. 

## 2015-06-02 NOTE — Telephone Encounter (Signed)
Annual physical made for May

## 2015-07-19 ENCOUNTER — Encounter: Payer: BC Managed Care – PPO | Admitting: Family Medicine

## 2015-07-26 ENCOUNTER — Ambulatory Visit (INDEPENDENT_AMBULATORY_CARE_PROVIDER_SITE_OTHER): Payer: BC Managed Care – PPO | Admitting: Family Medicine

## 2015-07-26 ENCOUNTER — Encounter: Payer: Self-pay | Admitting: Family Medicine

## 2015-07-26 VITALS — BP 102/60 | HR 100 | Temp 98.0°F | Resp 18 | Wt 155.9 lb

## 2015-07-26 DIAGNOSIS — Z720 Tobacco use: Secondary | ICD-10-CM | POA: Diagnosis not present

## 2015-07-26 DIAGNOSIS — G4700 Insomnia, unspecified: Secondary | ICD-10-CM

## 2015-07-26 DIAGNOSIS — Z23 Encounter for immunization: Secondary | ICD-10-CM

## 2015-07-26 DIAGNOSIS — F339 Major depressive disorder, recurrent, unspecified: Secondary | ICD-10-CM | POA: Diagnosis not present

## 2015-07-26 DIAGNOSIS — Z1239 Encounter for other screening for malignant neoplasm of breast: Secondary | ICD-10-CM

## 2015-07-26 DIAGNOSIS — Z0000 Encounter for general adult medical examination without abnormal findings: Secondary | ICD-10-CM | POA: Diagnosis not present

## 2015-07-26 DIAGNOSIS — K219 Gastro-esophageal reflux disease without esophagitis: Secondary | ICD-10-CM

## 2015-07-26 DIAGNOSIS — E2839 Other primary ovarian failure: Secondary | ICD-10-CM

## 2015-07-26 DIAGNOSIS — D692 Other nonthrombocytopenic purpura: Secondary | ICD-10-CM

## 2015-07-26 DIAGNOSIS — F17200 Nicotine dependence, unspecified, uncomplicated: Secondary | ICD-10-CM

## 2015-07-26 DIAGNOSIS — Z01419 Encounter for gynecological examination (general) (routine) without abnormal findings: Secondary | ICD-10-CM

## 2015-07-26 MED ORDER — ZOLPIDEM TARTRATE ER 12.5 MG PO TBCR: 12.5000 mg | EXTENDED_RELEASE_TABLET | Freq: Every evening | ORAL | Status: DC | PRN

## 2015-07-26 MED ORDER — CLONAZEPAM 1 MG PO TABS: 1.0000 mg | ORAL_TABLET | Freq: Every day | ORAL | Status: DC | PRN

## 2015-07-26 MED ORDER — VENLAFAXINE HCL ER 150 MG PO CP24: ORAL_CAPSULE | ORAL | Status: DC

## 2015-07-26 MED ORDER — ASPIRIN EC 81 MG PO TBEC: 81.0000 mg | DELAYED_RELEASE_TABLET | Freq: Every day | ORAL | Status: DC

## 2015-07-26 NOTE — Progress Notes (Signed)
Name: Tara [name]   MRN: [id number]    DOB: 04-09-1965   Date:07/26/2015       Progress Note  Subjective  Chief Complaint  Chief Complaint  Patient presents with  . Annual Exam    HPI  Well Woman: she had a hysterectomy, due for mammogram, no breast lumps, no bladder problems. Sexually active, no pain during intercourse  GERD: taking  Nexium and Ranitidine twice daily, doing better but still has symptoms of heartburn on her throat, and occasional regurgitation. Seeing GI  Major Depression: she still has some depression, she has some anhedonia and lack of energy. Worried about her mother and has recurrence of ovarian cancer.   Insomnia: she was getting Ambien through the pain clinic but no longer having to see pain doctor frequently and would like to get it from in instead. Only getting epidurals prn now.    Patient Active Problem List   Diagnosis Date Noted  . Fatty liver 01/28/2015  . Barrett esophagus 09/10/2014  . Major depression, recurrent, chronic (HCC) 09/08/2014  . Insomnia, persistent 09/08/2014  . Arthralgia, sacroiliac 09/08/2014  . Current smoker 09/08/2014  . Degeneration of lumbar or lumbosacral intervertebral disc 09/08/2014  . Gastro-esophageal reflux disease without esophagitis 09/08/2014  . Perennial allergic rhinitis 09/08/2014  . Nerve root disorder 09/08/2014  . Reflex sympathetic dystrophy 09/08/2014  . Retained orthopedic hardware 01/15/2012  . Chronic pain 01/25/2011    Past Surgical History  Procedure Laterality Date  . Abdominal hysterectomy  2005    partial  . Tubal ligation  1988  . Cervical disc arthroplasty  2008  . Knee arthroscopy Right 2008  . Spinal cord stimulator implant  2009    Removal-01/2012  . Oophorectomy Left 2011  . Lumbar laminectomy/decompression microdiscectomy  01/25/2011    Procedure: LUMBAR LAMINECTOMY/DECOMPRESSION MICRODISCECTOMY;  Surgeon: Alvy Beal;  Location: [facility];  Service: Orthopedics;  Laterality:  Left;  LEFT L5-S1 MICRODISCECTOMY, Central Decompression Lumbar five-sacral one  . Spinal cord stimulator removal  01/17/2012    Procedure: LUMBAR SPINAL CORD STIMULATOR REMOVAL;  Surgeon: Venita Lick, MD;  Location: [facility] OR;  Service: Orthopedics;  Laterality: N/A;  Spinal Cord Battery Removal  . Oophorectomy Right 10/2012    Family History  Problem Relation Age of Onset  . Depression Mother   . Ovarian cancer Mother     Social History   Social History  . Marital Status: Divorced    Spouse Name: N/A  . Number of Children: N/A  . Years of Education: N/A   Occupational History  .  Unemployed   Social History Main Topics  . Smoking status: Current Every Day Smoker -- 1.00 packs/day for 28 years    Types: Cigarettes    Start date: 09/10/1986  . Smokeless tobacco: Never Used  . Alcohol Use: No  . Drug Use: No  . Sexual Activity:    Partners: Male   Other Topics Concern  . Not on file   Social History Narrative     Current outpatient prescriptions:  .  esomeprazole (NEXIUM) 40 MG capsule, Take 1 capsule by mouth 2 (two) times daily., Disp: , Rfl:  .  Pyridoxine HCl (VITAMIN B-6) 250 MG tablet, Take 250 mg by mouth daily., Disp: , Rfl:  .  clonazePAM (KLONOPIN) 1 MG tablet, Take 1 tablet (1 mg total) by mouth daily as needed., Disp: 30 tablet, Rfl: 0 .  diclofenac (FLECTOR) 1.3 % PTCH, Place 1 patch onto the skin 2 (two) times daily  as needed. For pain, Disp: , Rfl:  .  methocarbamol (ROBAXIN) 500 MG tablet, Take 1 tablet by mouth at bedtime., Disp: , Rfl:  .  Omega-3 Fatty Acids (FISH OIL) 1200 MG CAPS, Take by mouth., Disp: , Rfl:  .  ranitidine (ZANTAC) 150 MG capsule, Take 1 capsule by mouth 2 (two) times daily., Disp: , Rfl: 3 .  RETIN-A MICRO PUMP 0.08 % GEL, APP EXT AA QHS, Disp: , Rfl: 2 .  sertraline (ZOLOFT) 100 MG tablet, Take by mouth., Disp: , Rfl:  .  venlafaxine XR (EFFEXOR-XR) 150 MG 24 hr capsule, TAKE 1 CAPSULE(150 MG) BY MOUTH DAILY WITH BREAKFAST, Disp:  90 capsule, Rfl: 1 .  zolpidem (AMBIEN CR) 12.5 MG CR tablet, Take 1 tablet (12.5 mg total) by mouth at bedtime as needed., Disp: 90 tablet, Rfl: 0  Allergies  Allergen Reactions  . Clonidine Derivatives Itching     ROS  Constitutional: Negative for fever , positive for weight change - changed her diet and lost about 15 lbs since last visit.  Respiratory: Negative for cough and shortness of breath.   Cardiovascular: Negative for chest pain or palpitations.  Gastrointestinal: Negative for abdominal pain, no bowel changes.  Musculoskeletal: Negative for gait problem or joint swelling.  Skin: Negative for rash.  Neurological: Negative for dizziness or headache.  No other specific complaints in a complete review of systems (except as listed in HPI above).  Objective  Filed Vitals:   07/26/15 1126  BP: 102/60  Pulse: 100  Temp: 98 F (36.7 C)  TempSrc: Oral  Resp: 18  Weight: 155 lb 14.4 oz (70.716 kg)  SpO2: 98%    Body mass index is 23.36 kg/(m^2).  Physical Exam  Constitutional: Patient appears well-developed and well-nourished. No distress.  HENT: Head: Normocephalic and atraumatic. Ears: B TMs ok, no erythema or effusion; Nose: Nose normal. Mouth/Throat: Oropharynx is clear and moist. No oropharyngeal exudate.  Eyes: Conjunctivae and EOM are normal. Pupils are equal, round, and reactive to light. No scleral icterus.  Neck: Normal range of motion. Neck supple. No JVD present. No thyromegaly present.  Cardiovascular: Normal rate, regular rhythm and normal heart sounds.  No murmur heard. No BLE edema. Pulmonary/Chest: Effort normal and breath sounds normal. No respiratory distress. Abdominal: Soft. Bowel sounds are normal, no distension. Mild epigastric pain, no masses Breast: no lumps or masses, no nipple discharge or rashes FEMALE GENITALIA:  Not done RECTAL: not done Musculoskeletal: Normal range of motion, no joint effusions. No gross deformities She has pain during  palpation of lumbar spine, and also right arm pain  Neurological: he is alert and oriented to person, place, and time. No cranial nerve deficit. Coordination, balance, strength, speech and gait are normal.  Skin: Skin is warm and dry. No rash noted. No erythema.  Psychiatric: Patient has a normal mood and affect. behavior is normal. Judgment and thought content normal.  PHQ2/9: [facility] screen [facility] 07/26/2015 01/18/2015 09/10/2014  Decreased Interest 0 0 0  Down, Depressed, Hopeless 0 1 0  PHQ - 2 Score 0 1 0    Fall Risk: Fall Risk  01/18/2015 09/10/2014  Falls in the past year? Yes No  Number falls in past yr: 1 -  Injury with Fall? Yes -    Functional Status Survey: Is the patient deaf or have difficulty hearing?: No Does the patient have difficulty seeing, even when wearing glasses/contacts?: No Does the patient have difficulty concentrating, remembering, or making decisions?: No Does the  patient have difficulty walking or climbing stairs?: No Does the patient have difficulty dressing or bathing?: No Does the patient have difficulty doing errands alone such as visiting a doctor's office or shopping?: No    Assessment & Plan  1. Well woman exam  Discussed importance of 150 minutes of physical activity weekly, eat two servings of fish weekly, eat one serving of tree nuts ( cashews, pistachios, pecans, almonds.Marland Kitchen) every other day, eat 6 servings of fruit/vegetables daily and drink plenty of water and avoid sweet beverages.   2. Insomnia, persistent  - zolpidem (AMBIEN CR) 12.5 MG CR tablet; Take 1 tablet (12.5 mg total) by mouth at bedtime as needed.  Dispense: 90 tablet; Refill: 0 - clonazePAM (KLONOPIN) 1 MG tablet; Take 1 tablet (1 mg total) by mouth daily as needed.  Dispense: 30 tablet; Refill: 0  3. Major depression, recurrent, chronic (HCC)  - venlafaxine XR (EFFEXOR-XR) 150 MG 24 hr capsule; TAKE 1 CAPSULE(150 MG) BY MOUTH DAILY WITH BREAKFAST  Dispense: 90 capsule;  Refill: 1  4. Gastro-esophageal reflux disease without esophagitis  Continue medication   5. Current smoker  - Pneumococcal conjugate vaccine 13-valent IM - CT CHEST LUNG CA SCREEN LOW DOSE W/O CM; Future  6. Breast cancer screening  - MM Digital Screening; Future  7. Purpura, nonthrombocytopenic (HCC)  Normal platelets, getting older, we will monitor

## 2015-10-04 ENCOUNTER — Ambulatory Visit (INDEPENDENT_AMBULATORY_CARE_PROVIDER_SITE_OTHER): Payer: Medicare Other | Admitting: Family Medicine

## 2015-10-04 ENCOUNTER — Encounter: Payer: Self-pay | Admitting: Family Medicine

## 2015-10-04 VITALS — BP 110/78 | HR 82 | Temp 98.0°F | Resp 18 | Ht 69.0 in | Wt 156.2 lb

## 2015-10-04 DIAGNOSIS — R350 Frequency of micturition: Secondary | ICD-10-CM

## 2015-10-04 LAB — POCT URINALYSIS DIPSTICK
BILIRUBIN UA: NEGATIVE
GLUCOSE UA: NEGATIVE
KETONES UA: NEGATIVE
Leukocytes, UA: NEGATIVE
Nitrite, UA: NEGATIVE
Protein, UA: NEGATIVE
RBC UA: NEGATIVE
UROBILINOGEN UA: NEGATIVE
pH, UA: 5

## 2015-10-04 MED ORDER — CIPROFLOXACIN HCL 250 MG PO TABS: 250.0000 mg | ORAL_TABLET | Freq: Two times a day (BID) | ORAL | 0 refills | Status: DC

## 2015-10-04 NOTE — Progress Notes (Signed)
Name: Tara [name]   MRN: [id number]    DOB: [birth year]   Date:10/04/2015       Progress Note  Subjective  Chief Complaint  Chief Complaint  Patient presents with  . Urinary Frequency    frequency since last week,no pain,no discomfort,no pressure or odor    HPI  Urinary frequency: she states she has noticed urinary frequency for the past week, sudden onset. She states she counted on Saturday and she voided 27 times. She also has nocturia 2-3 times over the past week. She has chronic  back pain, but no change in symptoms. She denies fever, chills, hematuria, abdominal pain  or dysuria. Bowel movements are regular. Bristol score of 4 for first bowel of the day and sometimes a 5 - she takes a fiber water daily.    Patient Active Problem List   Diagnosis Date Noted  . Fatty liver 01/28/2015  . Barrett esophagus 09/10/2014  . Major depression, recurrent, chronic (HCC) 09/08/2014  . Insomnia, persistent 09/08/2014  . Arthralgia, sacroiliac 09/08/2014  . Current smoker 09/08/2014  . Degeneration of lumbar or lumbosacral intervertebral disc 09/08/2014  . Gastro-esophageal reflux disease without esophagitis 09/08/2014  . Perennial allergic rhinitis 09/08/2014  . Nerve root disorder 09/08/2014  . Reflex sympathetic dystrophy 09/08/2014  . Retained orthopedic hardware 01/15/2012  . Chronic pain 01/25/2011    Past Surgical History:  Procedure Laterality Date  . ABDOMINAL HYSTERECTOMY  2005   partial  . CERVICAL DISC ARTHROPLASTY  2008  . KNEE ARTHROSCOPY Right 2008  . LUMBAR LAMINECTOMY/DECOMPRESSION MICRODISCECTOMY  01/25/2011   Procedure: LUMBAR LAMINECTOMY/DECOMPRESSION MICRODISCECTOMY;  Surgeon: Alvy Beal;  Location: [facility] OR;  Service: Orthopedics;  Laterality: Left;  LEFT L5-S1 MICRODISCECTOMY, Central Decompression Lumbar five-sacral one  . OOPHORECTOMY Left 2011  . OOPHORECTOMY Right 10/2012  . SPINAL CORD STIMULATOR IMPLANT  2009   Removal-01/2012  . SPINAL CORD  STIMULATOR REMOVAL  01/17/2012   Procedure: LUMBAR SPINAL CORD STIMULATOR REMOVAL;  Surgeon: Venita Lick, MD;  Location: [facility] OR;  Service: Orthopedics;  Laterality: N/A;  Spinal Cord Battery Removal  . TUBAL LIGATION  1988    Family History  Problem Relation Age of Onset  . Depression Mother   . Ovarian cancer Mother     Social History   Social History  . Marital status: Divorced    Spouse name: N/A  . Number of children: N/A  . Years of education: N/A   Occupational History  .  Unemployed   Social History Main Topics  . Smoking status: Current Every Day Smoker    Packs/day: 1.00    Years: 28.00    Types: Cigarettes    Start date: 09/10/1986  . Smokeless tobacco: Never Used  . Alcohol use No  . Drug use: No  . Sexual activity: Yes    Partners: Male   Other Topics Concern  . Not on file   Social History Narrative  . No narrative on file     Current Outpatient Prescriptions:  .  acidophilus (RISAQUAD) CAPS capsule, Take by mouth daily., Disp: , Rfl:  .  aspirin EC 81 MG tablet, Take 1 tablet (81 mg total) by mouth daily., Disp: 30 tablet, Rfl: 0 .  clonazePAM (KLONOPIN) 1 MG tablet, Take 1 tablet (1 mg total) by mouth daily as needed., Disp: 30 tablet, Rfl: 0 .  diclofenac (FLECTOR) 1.3 % PTCH, Place 1 patch onto the skin 2 (two) times daily as needed. For pain, Disp: , Rfl:  .  esomeprazole (NEXIUM) 40 MG capsule, Take 1 capsule by mouth 2 (two) times daily., Disp: , Rfl:  .  methocarbamol (ROBAXIN) 500 MG tablet, Take 1 tablet by mouth at bedtime., Disp: , Rfl:  .  Omega-3 Fatty Acids (FISH OIL) 1200 MG CAPS, Take by mouth., Disp: , Rfl:  .  Pyridoxine HCl (VITAMIN B-6) 250 MG tablet, Take 250 mg by mouth daily., Disp: , Rfl:  .  ranitidine (ZANTAC) 150 MG capsule, Take 1 capsule by mouth 2 (two) times daily., Disp: , Rfl: 3 .  RETIN-A MICRO PUMP 0.08 % GEL, APP EXT AA QHS, Disp: , Rfl: 2 .  venlafaxine XR (EFFEXOR-XR) 150 MG 24 hr capsule, TAKE 1 CAPSULE(150 MG) BY  MOUTH DAILY WITH BREAKFAST, Disp: 90 capsule, Rfl: 1 .  vitamin B-12 (CYANOCOBALAMIN) 250 MCG tablet, Take 250 mcg by mouth daily., Disp: , Rfl:  .  zolpidem (AMBIEN CR) 12.5 MG CR tablet, Take 1 tablet (12.5 mg total) by mouth at bedtime as needed., Disp: 90 tablet, Rfl: 0  Allergies  Allergen Reactions  . Clonidine Derivatives Itching     ROS  Ten systems reviewed and is negative except as mentioned in HPI Right wrist pain  Objective  Vitals:   10/04/15 1123  BP: 110/78  Pulse: 82  Resp: 18  Temp: 98 F (36.7 C)  SpO2: 95%  Weight: 156 lb 4 oz (70.9 kg)  Height: 5\' 9"  (1.753 m)    Body mass index is 23.07 kg/m.  Physical Exam  Constitutional: Patient appears well-developed and well-nourished. No distress.  HEENT: head atraumatic, normocephalic, pupils equal and reactive to light,neck supple, throat within normal limits Cardiovascular: Normal rate, regular rhythm and normal heart sounds.  No murmur heard. No BLE edema. Pulmonary/Chest: Effort normal and breath sounds normal. No respiratory distress. Abdominal: Soft.  There is no tenderness. Negative CVA tenderness Psychiatric: Patient has a normal mood and affect. behavior is normal. Judgment and thought content normal.   Recent Results (from the past 2160 hour(s))  POCT urinalysis dipstick     Status: None   Collection Time: 10/04/15 11:39 AM  Result Value Ref Range   Color, UA yellow    Clarity, UA clear    Glucose, UA neg    Bilirubin, UA neg    Ketones, UA neg    Spec Grav, UA <=1.005    Blood, UA neg    pH, UA 5.0    Protein, UA neg    Urobilinogen, UA negative    Nitrite, UA neg    Leukocytes, UA Negative Negative      PHQ2/9: Depression screen [facility] 2/9 07/26/2015 01/18/2015 09/10/2014  Decreased Interest 0 0 0  Down, Depressed, Hopeless 0 1 0  PHQ - 2 Score 0 1 0    Fall Risk: Fall Risk  01/18/2015 09/10/2014  Falls in the past year? Yes No  Number falls in past yr: 1 -  Injury with Fall? Yes -      Assessment & Plan  1. Urinary frequency  - POCT urinalysis dipstick

## 2015-10-05 LAB — URINE CULTURE: Organism ID, Bacteria: NO GROWTH

## 2015-10-07 ENCOUNTER — Other Ambulatory Visit: Payer: Self-pay | Admitting: Family Medicine

## 2015-10-07 ENCOUNTER — Encounter: Payer: Self-pay | Admitting: Family Medicine

## 2015-10-07 DIAGNOSIS — R350 Frequency of micturition: Secondary | ICD-10-CM

## 2015-10-24 ENCOUNTER — Other Ambulatory Visit: Payer: Self-pay | Admitting: Family Medicine

## 2015-10-24 DIAGNOSIS — R350 Frequency of micturition: Secondary | ICD-10-CM

## 2015-10-26 ENCOUNTER — Ambulatory Visit: Payer: BC Managed Care – PPO | Admitting: Family Medicine

## 2015-11-08 ENCOUNTER — Other Ambulatory Visit: Payer: Self-pay

## 2015-11-08 DIAGNOSIS — G4700 Insomnia, unspecified: Secondary | ICD-10-CM

## 2015-11-08 NOTE — Telephone Encounter (Signed)
Patient requesting refill of Ambien be sent into [facility].

## 2015-11-09 MED ORDER — ZOLPIDEM TARTRATE ER 12.5 MG PO TBCR: 12.5000 mg | EXTENDED_RELEASE_TABLET | Freq: Every evening | ORAL | 0 refills | Status: DC | PRN

## 2015-11-22 ENCOUNTER — Ambulatory Visit: Payer: Medicare Other | Admitting: Family Medicine

## 2016-01-17 ENCOUNTER — Encounter: Payer: Self-pay | Admitting: Family Medicine

## 2016-01-17 ENCOUNTER — Ambulatory Visit (INDEPENDENT_AMBULATORY_CARE_PROVIDER_SITE_OTHER): Payer: Medicare Other | Admitting: Family Medicine

## 2016-01-17 VITALS — BP 110/72 | HR 90 | Temp 98.7°F | Wt 157.8 lb

## 2016-01-17 DIAGNOSIS — G4700 Insomnia, unspecified: Secondary | ICD-10-CM | POA: Diagnosis not present

## 2016-01-17 DIAGNOSIS — D692 Other nonthrombocytopenic purpura: Secondary | ICD-10-CM

## 2016-01-17 DIAGNOSIS — Z23 Encounter for immunization: Secondary | ICD-10-CM

## 2016-01-17 DIAGNOSIS — E785 Hyperlipidemia, unspecified: Secondary | ICD-10-CM | POA: Diagnosis not present

## 2016-01-17 DIAGNOSIS — Z79899 Other long term (current) drug therapy: Secondary | ICD-10-CM | POA: Diagnosis not present

## 2016-01-17 DIAGNOSIS — F339 Major depressive disorder, recurrent, unspecified: Secondary | ICD-10-CM | POA: Diagnosis not present

## 2016-01-17 LAB — COMPLETE METABOLIC PANEL WITH GFR
ALBUMIN: 4.1 g/dL (ref 3.6–5.1)
ALT: 15 U/L (ref 6–29)
AST: 16 U/L (ref 10–35)
Alkaline Phosphatase: 106 U/L (ref 33–130)
BILIRUBIN TOTAL: 0.4 mg/dL (ref 0.2–1.2)
BUN: 19 mg/dL (ref 7–25)
CO2: 27 mmol/L (ref 20–31)
CREATININE: 0.96 mg/dL (ref 0.50–1.05)
Calcium: 9.4 mg/dL (ref 8.6–10.4)
Chloride: 105 mmol/L (ref 98–110)
GFR, EST AFRICAN AMERICAN: 79 mL/min (ref >=60)
GFR, Est Non African American: 68 mL/min (ref >=60)
GLUCOSE: 87 mg/dL (ref 65–99)
Potassium: 4.9 mmol/L (ref 3.5–5.3)
Sodium: 140 mmol/L (ref 135–146)
TOTAL PROTEIN: 7.5 g/dL (ref 6.1–8.1)

## 2016-01-17 LAB — LIPID PANEL
Cholesterol: 199 mg/dL (ref <200)
HDL: 42 mg/dL — ABNORMAL LOW (ref >50)
LDL CALC: 132 mg/dL — AB
TRIGLYCERIDES: 123 mg/dL (ref <150)
Total CHOL/HDL Ratio: 4.7 Ratio (ref <5.0)
VLDL: 25 mg/dL (ref <30)

## 2016-01-17 MED ORDER — VENLAFAXINE HCL ER 150 MG PO CP24: ORAL_CAPSULE | ORAL | 1 refills | Status: DC

## 2016-01-17 MED ORDER — QUETIAPINE FUMARATE 25 MG PO TABS: 25.0000 mg | ORAL_TABLET | Freq: Every day | ORAL | 0 refills | Status: DC

## 2016-01-17 NOTE — Progress Notes (Signed)
Name: Tara GrahamCynthia W Lavell   MRN: [id number]    DOB: 1966/02/09   Date:01/17/2016       Progress Note  Subjective  Chief Complaint  Chief Complaint  Patient presents with  . Medication Refill    Refills on Ambien and Effexor xr  . Gastroesophageal Reflux    Nexium is too expensive and would like to be changed back to Omperazole  . Depression  . Insomnia    HPI  GERD/Barrett's esophagus: taking  Nexium and Ranitidine twice daily, doing better but still has symptoms of heartburn on her throat, and occasional regurgitation. She could not tolerate aspirin because of burning sensation, even the chewable kind.  Major Depression: she still  has some depression, but feeling better. [place] been going there on weekends.  Mother is doing better, finished radiation and chemotherapy for ovarian cancer  Insomnia: she was getting Ambien through the pain clinic but no longer having to see pain doctor frequently and would like to get it from in instead. Only getting epidurals prn now. Discussed trying something else and we will try Seroquel   Dyslipidemia: on diet only, we will recheck labs  Patient Active Problem List   Diagnosis Date Noted  . Fatty liver 01/28/2015  . Barrett esophagus 09/10/2014  . Major depression, recurrent, chronic (HCC) 09/08/2014  . Insomnia, persistent 09/08/2014  . Arthralgia, sacroiliac 09/08/2014  . Current smoker 09/08/2014  . Degeneration of lumbar or lumbosacral intervertebral disc 09/08/2014  . Gastro-esophageal reflux disease without esophagitis 09/08/2014  . Perennial allergic rhinitis 09/08/2014  . Nerve root disorder 09/08/2014  . Reflex sympathetic dystrophy 09/08/2014  . Retained orthopedic hardware 01/15/2012  . Chronic pain 01/25/2011    Past Surgical History:  Procedure Laterality Date  . ABDOMINAL HYSTERECTOMY  2005   partial  . CERVICAL DISC ARTHROPLASTY  2008  . KNEE ARTHROSCOPY Right 2008  . LUMBAR  LAMINECTOMY/DECOMPRESSION MICRODISCECTOMY  01/25/2011   Procedure: LUMBAR LAMINECTOMY/DECOMPRESSION MICRODISCECTOMY;  Surgeon: Alvy Bealahari D Brooks;  Location: [facility] OR;  Service: Orthopedics;  Laterality: Left;  LEFT L5-S1 MICRODISCECTOMY, Central Decompression Lumbar five-sacral one  . OOPHORECTOMY Left 2011  . OOPHORECTOMY Right 10/2012  . SPINAL CORD STIMULATOR IMPLANT  2009   Removal-01/2012  . SPINAL CORD STIMULATOR REMOVAL  01/17/2012   Procedure: LUMBAR SPINAL CORD STIMULATOR REMOVAL;  Surgeon: Venita Lickahari Brooks, MD;  Location: [facility] OR;  Service: Orthopedics;  Laterality: N/A;  Spinal Cord Battery Removal  . TUBAL LIGATION  1988    Family History  Problem Relation Age of Onset  . Depression Mother   . Ovarian cancer Mother     Social History   Social History  . Marital status: Divorced    Spouse name: N/A  . Number of children: N/A  . Years of education: N/A   Occupational History  .  Unemployed   Social History Main Topics  . Smoking status: Current Every Day Smoker    Packs/day: 1.00    Years: 28.00    Types: Cigarettes    Start date: 09/10/1986  . Smokeless tobacco: Never Used  . Alcohol use No  . Drug use: No  . Sexual activity: Yes    Partners: Male   Other Topics Concern  . Not on file   Social History Narrative  . No narrative on file     Current Outpatient Prescriptions:  .  clonazePAM (KLONOPIN) 1 MG tablet, Take 1 tablet (1 mg total) by mouth daily as needed., Disp:  30 tablet, Rfl: 0 .  diclofenac (FLECTOR) 1.3 % PTCH, Place 1 patch onto the skin 2 (two) times daily as needed. For pain, Disp: , Rfl:  .  esomeprazole (NEXIUM) 40 MG capsule, Take 1 capsule by mouth 2 (two) times daily., Disp: , Rfl:  .  methocarbamol (ROBAXIN) 500 MG tablet, Take 1 tablet by mouth at bedtime., Disp: , Rfl:  .  ranitidine (ZANTAC) 150 MG capsule, Take 1 capsule by mouth 2 (two) times daily., Disp: , Rfl: 3 .  RETIN-A MICRO PUMP 0.08 % GEL, APP EXT AA QHS, Disp: , Rfl: 2 .   venlafaxine XR (EFFEXOR-XR) 150 MG 24 hr capsule, TAKE 1 CAPSULE(150 MG) BY MOUTH DAILY WITH BREAKFAST, Disp: 90 capsule, Rfl: 1 .  zolpidem (AMBIEN CR) 12.5 MG CR tablet, Take 1 tablet (12.5 mg total) by mouth at bedtime as needed., Disp: 90 tablet, Rfl: 0 .  acidophilus (RISAQUAD) CAPS capsule, Take by mouth daily., Disp: , Rfl:  .  Omega-3 Fatty Acids (FISH OIL) 1200 MG CAPS, Take by mouth., Disp: , Rfl:  .  Pyridoxine HCl (VITAMIN B-6) 250 MG tablet, Take 250 mg by mouth daily., Disp: , Rfl:  .  QUEtiapine (SEROQUEL) 25 MG tablet, Take 1 tablet (25 mg total) by mouth at bedtime., Disp: 30 tablet, Rfl: 0 .  vitamin B-12 (CYANOCOBALAMIN) 250 MCG tablet, Take 250 mcg by mouth daily., Disp: , Rfl:   Allergies  Allergen Reactions  . Aspirin Other (See Comments)    Causes heartburn even the chewable kind  . Clonidine Derivatives Itching     ROS  Constitutional: Negative for fever or weight change.  Respiratory: Negative for cough and shortness of breath.   Cardiovascular: Negative for chest pain or palpitations.  Gastrointestinal: Negative for abdominal pain, no bowel changes.  Musculoskeletal: Negative for gait problem or joint swelling.  Skin: Negative for rash.  Neurological: Negative for dizziness or headache.  No other specific complaints in a complete review of systems (except as listed in HPI above).  Objective  Vitals:   01/17/16 0857  BP: 110/72  Pulse: 90  Temp: 98.7 F (37.1 C)  SpO2: 97%  Weight: 157 lb 12.8 oz (71.6 kg)    Body mass index is 23.3 kg/m.  Physical Exam  Constitutional: Patient appears well-developed and well-nourished.  No distress.  HEENT: head atraumatic, normocephalic, pupils equal and reactive to light,neck supple, throat within normal limits Cardiovascular: Normal rate, regular rhythm and normal heart sounds.  No murmur heard. No BLE edema. Pulmonary/Chest: Effort normal and breath sounds normal. No respiratory distress. Abdominal: Soft.   There is no tenderness. Psychiatric: Patient has a normal mood and affect. behavior is normal. Judgment and thought content normal.   PHQ2/9: Depression screen [facility]/9 01/17/2016 07/26/2015 01/18/2015 09/10/2014  Decreased Interest 0 0 0 0  Down, Depressed, Hopeless 0 0 1 0  PHQ - 2 Score 0 0 1 0     Fall Risk: Fall Risk  01/17/2016 01/18/2015 09/10/2014  Falls in the past year? No Yes No  Number falls in past yr: - 1 -  Injury with Fall? - Yes -     Functional Status Survey: Is the patient deaf or have difficulty hearing?: No Does the patient have difficulty seeing, even when wearing glasses/contacts?: No Does the patient have difficulty concentrating, remembering, or making decisions?: No Does the patient have difficulty walking or climbing stairs?: No Does the patient have difficulty dressing or bathing?: No Does the patient have difficulty  doing errands alone such as visiting a doctor's office or shopping?: No    Assessment & Plan  1. Major depression, recurrent, chronic (HCC)  - venlafaxine XR (EFFEXOR-XR) 150 MG 24 hr capsule; TAKE 1 CAPSULE(150 MG) BY MOUTH DAILY WITH BREAKFAST  Dispense: 90 capsule; Refill: 1  2. Needs flu shot  - Flu Vaccine QUAD 36+ mos PF IM (Fluarix & Fluzone Quad PF)  3. Insomnia, persistent  - QUEtiapine (SEROQUEL) 25 MG tablet; Take 1 tablet (25 mg total) by mouth at bedtime.  Dispense: 30 tablet; Refill: 0  4. Dyslipidemia  On diet only -lipid panel   5. Purpura, nonthrombocytopenic (HCC)  Stable  6. Long-term use of high-risk medication  - COMPLETE METABOLIC PANEL WITH GFR

## 2016-01-26 ENCOUNTER — Telehealth: Payer: Self-pay | Admitting: Family Medicine

## 2016-01-26 NOTE — Telephone Encounter (Signed)
Pt states the Sequel is not working for her and ask for a refill on Ambien. Please Advise. [facility] [place]

## 2016-01-27 ENCOUNTER — Other Ambulatory Visit: Payer: Self-pay | Admitting: Family Medicine

## 2016-01-27 DIAGNOSIS — G4700 Insomnia, unspecified: Secondary | ICD-10-CM

## 2016-01-27 MED ORDER — ZOLPIDEM TARTRATE ER 6.25 MG PO TBCR: 6.2500 mg | EXTENDED_RELEASE_TABLET | Freq: Every evening | ORAL | 2 refills | Status: DC | PRN

## 2016-01-27 NOTE — Telephone Encounter (Signed)
Patient states she has been on 12.5 mg for a long time but will take the 6.25 mg for now and discuss there with Dr. Carlynn PurlSowles during her next visit. [facility] [place] Thanks

## 2016-01-27 NOTE — Telephone Encounter (Signed)
I don't feel comfortable giving 12.5 mg of Ambien for a female, so I can send 6.25 mg

## 2016-01-27 NOTE — Telephone Encounter (Signed)
Please fax, printed

## 2016-03-23 ENCOUNTER — Other Ambulatory Visit: Payer: Self-pay

## 2016-03-23 DIAGNOSIS — G4700 Insomnia, unspecified: Secondary | ICD-10-CM

## 2016-03-23 MED ORDER — QUETIAPINE FUMARATE 25 MG PO TABS: 25.0000 mg | ORAL_TABLET | Freq: Every day | ORAL | 0 refills | Status: DC

## 2016-03-23 MED ORDER — ZOLPIDEM TARTRATE ER 6.25 MG PO TBCR: 6.2500 mg | EXTENDED_RELEASE_TABLET | Freq: Every evening | ORAL | 0 refills | Status: DC | PRN

## 2016-03-23 NOTE — Telephone Encounter (Signed)
Patient requesting refill of Ambien and Quetiapine to [facility].

## 2016-03-27 ENCOUNTER — Other Ambulatory Visit: Payer: Self-pay

## 2016-03-27 DIAGNOSIS — F339 Major depressive disorder, recurrent, unspecified: Secondary | ICD-10-CM

## 2016-03-27 MED ORDER — VENLAFAXINE HCL ER 150 MG PO CP24: ORAL_CAPSULE | ORAL | 0 refills | Status: DC

## 2016-03-27 NOTE — Telephone Encounter (Signed)
Patient requesting refill of Venlafaxine to [facility].  

## 2016-04-05 ENCOUNTER — Other Ambulatory Visit: Payer: Self-pay | Admitting: Family Medicine

## 2016-04-05 DIAGNOSIS — G4700 Insomnia, unspecified: Secondary | ICD-10-CM

## 2016-04-05 NOTE — Telephone Encounter (Signed)
Sent 03/23/2016

## 2016-04-05 NOTE — Telephone Encounter (Signed)
Pt states she needs a refill on Quetiapine to be [facility] into [facility] [place]. Pt now uses this pharmacy per her insurance. Please advise.

## 2016-04-26 ENCOUNTER — Encounter: Payer: Self-pay | Admitting: Family Medicine

## 2016-04-26 ENCOUNTER — Ambulatory Visit (INDEPENDENT_AMBULATORY_CARE_PROVIDER_SITE_OTHER): Payer: Medicare Other | Admitting: Family Medicine

## 2016-04-26 VITALS — BP 112/64 | HR 100 | Temp 98.5°F | Resp 18 | Ht 69.0 in | Wt 161.1 lb

## 2016-04-26 DIAGNOSIS — E785 Hyperlipidemia, unspecified: Secondary | ICD-10-CM | POA: Diagnosis not present

## 2016-04-26 DIAGNOSIS — Z0000 Encounter for general adult medical examination without abnormal findings: Secondary | ICD-10-CM | POA: Diagnosis not present

## 2016-04-26 DIAGNOSIS — Z1231 Encounter for screening mammogram for malignant neoplasm of breast: Secondary | ICD-10-CM

## 2016-04-26 DIAGNOSIS — F339 Major depressive disorder, recurrent, unspecified: Secondary | ICD-10-CM | POA: Diagnosis not present

## 2016-04-26 DIAGNOSIS — E2839 Other primary ovarian failure: Secondary | ICD-10-CM

## 2016-04-26 DIAGNOSIS — F17200 Nicotine dependence, unspecified, uncomplicated: Secondary | ICD-10-CM | POA: Diagnosis not present

## 2016-04-26 DIAGNOSIS — G4700 Insomnia, unspecified: Secondary | ICD-10-CM

## 2016-04-26 MED ORDER — CLONAZEPAM 0.5 MG PO TABS: 0.5000 mg | ORAL_TABLET | Freq: Every day | ORAL | 0 refills | Status: DC | PRN

## 2016-04-26 MED ORDER — VENLAFAXINE HCL ER 150 MG PO CP24: ORAL_CAPSULE | ORAL | 0 refills | Status: DC

## 2016-04-26 MED ORDER — QUETIAPINE FUMARATE 50 MG PO TABS
50.0000 mg | ORAL_TABLET | Freq: Every day | ORAL | 1 refills | Status: DC
Start: 2016-04-26 — End: 2016-11-01

## 2016-04-26 NOTE — Patient Instructions (Addendum)
   [name] , Thank you for taking time to come for your Medicare Wellness Visit. I appreciate your ongoing commitment to your health goals. Please review the following plan we discussed and let me know if I can assist you in the future.   These are the goals we discussed:  -  eat fish twice a week - eat tree nuts three times a week - decrease portion size and try to lose 0.5 - 1 lb per week    This is a list of the screening recommended for you and due dates:  Health Maintenance  Topic Date Due  . Mammogram  03/02/2016  . HIV Screening  02/06/2018*  . Tetanus Vaccine  10/09/2021  . Colon Cancer Screening  04/21/2024  . Flu Shot  Completed  .  Hepatitis C: One time screening is recommended by Center for Disease Control  (CDC) for  adults born from [birth year] through 1965.   Completed  *Topic was postponed. The date shown is not the original due date.

## 2016-04-26 NOTE — Progress Notes (Signed)
Name: Tara [name]   MRN: [id number]    DOB: 07/01/1965   Date:04/26/2016       Progress Note  Subjective  Chief Complaint  Chief Complaint  Patient presents with  . Annual Exam    HPI  Functional ability/safety issues: No Issues Hearing issues: Addressed  Activities of daily living: Discussed Home safety issues: No Issues  End Of Life Planning: Offered verbal information regarding advanced directives, healthcare power of attorney.  Preventative care, Health maintenance, Preventative health measures discussed.  Preventative screenings discussed today: lab work, colonoscopy is up to date ,  mammogram, DEXA.  Low Dose CT Chest recommended if Age 53-80 years, 30 pack-year currently smoking OR have quit w/in 15years.   Lifestyle risk factor issued reviewed: Diet, exercise, weight management, advised patient smoking is not healthy, nutrition/diet.  Preventative health measures discussed (5-10 year plan).  Reviewed and recommended vaccinations: - Pneumovax  - Prevnar  - Annual Influenza - Zostavax - Tdap   Depression screening: Done Fall risk screening: Done Discuss ADLs/IADLs: needs to take breaks because of right arm weakness and back pain   Current medical providers: See HPI  Other health risk factors identified this visit: No other issues Cognitive impairment issues: None identified  All above discussed with patient. Appropriate education, counseling and referral will be made based upon the above.   GERD/Barrett's esophagus: taking Nexium and Ranitidine twice daily, doing better but still has symptoms of heartburn on her throat, and occasional regurgitation. She could not tolerate aspirin because of burning sensation, not even the  chewable kind. She is due for repeat EGD in 2019  Major Depression: she still  has some depression, but feeling better. She bought a cottage in [place] - and has been going there on weekends.  Mother is doing better, finished radiation  and chemotherapy for ovarian cancer. She takes Effexor as prescribed and going to mountains helps.   Insomnia: she has been off Ambien and states able to sleep on 50 mg of Seroquel but 25 mg only gets her about 3 hours of sleep.   Dyslipidemia: on diet only, last HDL was low and LDL was slightly better at 132.   Reflex Sympathetic Dystrophy: she has been on disability since 2010, for RSD right arm and chronic back pain. She still has visits with pain clinic and gets epidurals injection, off pain medications. Full Medicare coverage since July 53 2017  Patient Active Problem List   Diagnosis Date Noted  . Fatty liver 01/28/2015  . Barrett esophagus 09/10/2014  . Major depression, recurrent, chronic (HCC) 09/08/2014  . Insomnia, persistent 09/08/2014  . Arthralgia, sacroiliac 09/08/2014  . Current smoker 09/08/2014  . Degeneration of lumbar or lumbosacral intervertebral disc 09/08/2014  . Gastro-esophageal reflux disease without esophagitis 09/08/2014  . Perennial allergic rhinitis 09/08/2014  . Nerve root disorder 09/08/2014  . Reflex sympathetic dystrophy 09/08/2014  . Retained orthopedic hardware 01/15/2012  . Chronic pain 01/25/2011    Past Surgical History:  Procedure Laterality Date  . ABDOMINAL HYSTERECTOMY  2005   partial  . CERVICAL DISC ARTHROPLASTY  2008  . KNEE ARTHROSCOPY Right 2008  . LUMBAR LAMINECTOMY/DECOMPRESSION MICRODISCECTOMY  01/25/2011   Procedure: LUMBAR LAMINECTOMY/DECOMPRESSION MICRODISCECTOMY;  Surgeon: Alvy Beal;  Location: [facility] OR;  Service: Orthopedics;  Laterality: Left;  LEFT L5-S1 MICRODISCECTOMY, Central Decompression Lumbar five-sacral one  . OOPHORECTOMY Left 2011  . OOPHORECTOMY Right 10/2012  . SPINAL CORD STIMULATOR IMPLANT  2009   Removal-01/2012  . SPINAL CORD STIMULATOR REMOVAL  01/17/2012   Procedure: LUMBAR SPINAL CORD STIMULATOR REMOVAL;  Surgeon: Venita Lickahari Brooks, MD;  Location: [facility] OR;  Service: Orthopedics;  Laterality: N/A;  Spinal  Cord Battery Removal  . TUBAL LIGATION  1988    Family History  Problem Relation Age of Onset  . Depression Mother   . Ovarian cancer Mother   . Hypertension Mother   . Hypercholesterolemia Mother   . Obesity Mother   . Hypertension Sister   . Obesity Sister     Social History   Social History  . Marital status: Divorced    Spouse name: N/A  . Number of children: N/A  . Years of education: N/A   Occupational History  .  Unemployed   Social History Main Topics  . Smoking status: Current Every Day Smoker    Packs/day: 1.00    Years: 30.00    Types: Cigarettes    Start date: 09/10/1986  . Smokeless tobacco: Never Used  . Alcohol use No  . Drug use: No  . Sexual activity: Yes    Partners: Male    Birth control/ protection: Post-menopausal   Other Topics Concern  . Not on file   Social History Narrative  . No narrative on file     Current Outpatient Prescriptions:  .  acidophilus (RISAQUAD) CAPS capsule, Take by mouth daily., Disp: , Rfl:  .  clonazePAM (KLONOPIN) 0.5 MG tablet, Take 1 tablet (0.5 mg total) by mouth daily as needed., Disp: 30 tablet, Rfl: 0 .  diclofenac (FLECTOR) 1.3 % PTCH, Place 1 patch onto the skin 2 (two) times daily as needed. For pain, Disp: , Rfl:  .  esomeprazole (NEXIUM) 40 MG capsule, Take 1 capsule by mouth 2 (two) times daily., Disp: , Rfl:  .  methocarbamol (ROBAXIN) 500 MG tablet, Take 1 tablet by mouth at bedtime., Disp: , Rfl:  .  Pyridoxine HCl (VITAMIN B-6) 250 MG tablet, Take 250 mg by mouth daily., Disp: , Rfl:  .  QUEtiapine (SEROQUEL) 50 MG tablet, Take 1 tablet (50 mg total) by mouth at bedtime., Disp: 90 tablet, Rfl: 1 .  ranitidine (ZANTAC) 150 MG capsule, Take 1 capsule by mouth 2 (two) times daily., Disp: , Rfl: 3 .  RETIN-A MICRO PUMP 0.08 % GEL, APP EXT AA QHS, Disp: , Rfl: 2 .  venlafaxine XR (EFFEXOR-XR) 150 MG 24 hr capsule, TAKE 1 CAPSULE(150 MG) BY MOUTH DAILY WITH BREAKFAST, Disp: 90 capsule, Rfl: 0  Allergies   Allergen Reactions  . Aspirin Other (See Comments)    Causes heartburn even the chewable kind  . Clonidine Derivatives Itching     ROS  Constitutional: Negative for fever or weight change.  Respiratory: Negative for cough and shortness of breath.   Cardiovascular: Negative for chest pain or palpitations.  Gastrointestinal: Negative for abdominal pain, no bowel changes.  Musculoskeletal: Negative for gait problem or joint swelling.  Skin: Negative for rash.  Neurological: Negative for dizziness or headache.  No other specific complaints in a complete review of systems (except as listed in HPI above).   Objective  Vitals:   04/26/16 0848  BP: 112/64  Pulse: 100  Resp: 18  Temp: 98.5 F (36.9 C)  TempSrc: Oral  SpO2: 97%  Weight: 161 lb 1.6 oz (73.1 kg)  Height: 5\' 9"  (1.753 m)    Body mass index is 23.79 kg/m.  Physical Exam  Constitutional: Patient appears well-developed and well-nourished. No distress.  HENT: Head: Normocephalic and atraumatic. Ears: B TMs ok,  no erythema or effusion; Nose: Nose normal. Mouth/Throat: Oropharynx is clear and moist. No oropharyngeal exudate.  Eyes: Conjunctivae and EOM are normal. Pupils are equal, round, and reactive to light. No scleral icterus.  Neck: Normal range of motion. Neck supple. No JVD present. No thyromegaly present.  Cardiovascular: Normal rate, regular rhythm and normal heart sounds.  No murmur heard. No BLE edema. Pulmonary/Chest: Effort normal and breath sounds normal. No respiratory distress. Abdominal: Soft. Bowel sounds are normal, no distension. There is no tenderness. no masses Breast: no lumps or masses, no nipple discharge or rashes FEMALE GENITALIA:  External genitalia normal External urethra normal Pelvic not done RECTAL: not done Musculoskeletal: Normal range of motion, pain with rom of lumbar spine,and with palpation of sacral area, normal grip both hands, right may be slightly weaker than left   Neurological: he is alert and oriented to person, place, and time. No cranial nerve deficit. Coordination, balance, strength, speech and gait are normal.  Skin: Skin is warm and dry. No rash noted. No erythema.  Psychiatric: Patient has a normal mood and affect. behavior is normal. Judgment and thought content normal.  PHQ2/9: Depression screen [facility] 2/9 04/26/2016 01/17/2016 07/26/2015 01/18/2015 09/10/2014  Decreased Interest 0 0 0 0 0  Down, Depressed, Hopeless 0 0 0 1 0  PHQ - 2 Score 0 0 0 1 0     Fall Risk: Fall Risk  04/26/2016 01/17/2016 01/18/2015 09/10/2014  Falls in the past year? No No Yes No  Number falls in past yr: - - 1 -  Injury with Fall? - - Yes -     Functional Status Survey: Is the patient deaf or have difficulty hearing?: No Does the patient have difficulty seeing, even when wearing glasses/contacts?: No Does the patient have difficulty concentrating, remembering, or making decisions?: No Does the patient have difficulty walking or climbing stairs?: No Does the patient have difficulty dressing or bathing?: No Does the patient have difficulty doing errands alone such as visiting a doctor's office or shopping?: No    Assessment & Plan  1. Medicare welcome exam  Discussed importance of 150 minutes of physical activity weekly, eat two servings of fish weekly, eat one serving of tree nuts ( cashews, pistachios, pecans, almonds.Marland Kitchen) every other day, eat 6 servings of fruit/vegetables daily and drink plenty of water and avoid sweet beverages.  - DG Bone Density; Future - MM Digital Screening; Future  2. Major depression, recurrent, chronic (HCC)  - venlafaxine XR (EFFEXOR-XR) 150 MG 24 hr capsule; TAKE 1 CAPSULE(150 MG) BY MOUTH DAILY WITH BREAKFAST  Dispense: 90 capsule; Refill: 0  3. Dyslipidemia   Discussed importance of 150 minutes of physical activity weekly, eat two servings of fish weekly, eat one serving of tree nuts ( cashews, pistachios, pecans, almonds.Marland Kitchen)  every other day, eat 6 servings of fruit/vegetables daily and drink plenty of water and avoid sweet beverages.   4. Insomnia, persistent  - QUEtiapine (SEROQUEL) 50 MG tablet; Take 1 tablet (50 mg total) by mouth at bedtime.  Dispense: 90 tablet; Refill: 1 - clonazePAM (KLONOPIN) 0.5 MG tablet; Take 1 tablet (0.5 mg total) by mouth daily as needed.  Dispense: 30 tablet; Refill: 0  5. Ovarian failure  - DG Bone Density; Future  6. Current smoker  Not ready to quit  7. Encounter for screening mammogram for breast cancer  - MM Digital Screening; Future

## 2016-04-27 ENCOUNTER — Encounter: Payer: Medicare Other | Admitting: Family Medicine

## 2016-06-05 ENCOUNTER — Ambulatory Visit
Admission: RE | Admit: 2016-06-05 | Discharge: 2016-06-05 | Disposition: A | Discharge status: Home / Self Care | Payer: Medicare Other | Source: Ambulatory Visit | Attending: Family Medicine | Admitting: Family Medicine

## 2016-06-05 DIAGNOSIS — F17210 Nicotine dependence, cigarettes, uncomplicated: Secondary | ICD-10-CM | POA: Diagnosis not present

## 2016-06-05 DIAGNOSIS — M85851 Other specified disorders of bone density and structure, right thigh: Secondary | ICD-10-CM | POA: Insufficient documentation

## 2016-06-05 DIAGNOSIS — Z1231 Encounter for screening mammogram for malignant neoplasm of breast: Secondary | ICD-10-CM | POA: Diagnosis not present

## 2016-06-05 DIAGNOSIS — Z0000 Encounter for general adult medical examination without abnormal findings: Secondary | ICD-10-CM

## 2016-06-05 DIAGNOSIS — E2839 Other primary ovarian failure: Secondary | ICD-10-CM

## 2016-06-22 ENCOUNTER — Ambulatory Visit: Payer: Medicare Other | Admitting: Family Medicine

## 2016-07-06 ENCOUNTER — Encounter: Payer: Self-pay | Admitting: Family Medicine

## 2016-07-06 ENCOUNTER — Ambulatory Visit (INDEPENDENT_AMBULATORY_CARE_PROVIDER_SITE_OTHER): Payer: Medicare Other | Admitting: Family Medicine

## 2016-07-06 VITALS — BP 114/60 | HR 99 | Temp 97.3°F | Resp 16 | Ht 69.0 in | Wt 158.9 lb

## 2016-07-06 DIAGNOSIS — F17200 Nicotine dependence, unspecified, uncomplicated: Secondary | ICD-10-CM

## 2016-07-06 DIAGNOSIS — J9801 Acute bronchospasm: Secondary | ICD-10-CM

## 2016-07-06 MED ORDER — BENZONATATE 100 MG PO CAPS: 100.0000 mg | ORAL_CAPSULE | Freq: Two times a day (BID) | ORAL | 0 refills | Status: DC | PRN

## 2016-07-06 MED ORDER — BUDESONIDE-FORMOTEROL FUMARATE 80-4.5 MCG/ACT IN AERO: 2.0000 | INHALATION_SPRAY | Freq: Two times a day (BID) | RESPIRATORY_TRACT | 0 refills | Status: DC

## 2016-07-06 NOTE — Progress Notes (Signed)
Name: Tara COLONNA   MRN: [id number]    DOB: Dec 12, 1965   Date:07/06/2016       Progress Note  Subjective  Chief Complaint  Chief Complaint  Patient presents with  . URI    Onset-Month and half, dry cough, wheezing, SOB. Has taken Mucinex DM with some relief.    HPI  Pt presents with c/o dry, non-productive cough for 8 weeks - reports having URI 8 weeks ago and other symptoms improved, but cough has remained and is worse at night with accompanying wheezing. Denies chest pain, shortness of breath, fevers/chills, NVD, abdominal pain. Pt is current 1ppd smoker  Patient Active Problem List   Diagnosis Date Noted  . Fatty liver 01/28/2015  . Barrett esophagus 09/10/2014  . Major depression, recurrent, chronic (HCC) 09/08/2014  . Insomnia, persistent 09/08/2014  . Arthralgia, sacroiliac 09/08/2014  . Current smoker 09/08/2014  . Degeneration of lumbar or lumbosacral intervertebral disc 09/08/2014  . Gastro-esophageal reflux disease without esophagitis 09/08/2014  . Perennial allergic rhinitis 09/08/2014  . Nerve root disorder 09/08/2014  . Reflex sympathetic dystrophy 09/08/2014  . Retained orthopedic hardware 01/15/2012  . Chronic pain 01/25/2011    Social History  Substance Use Topics  . Smoking status: Current Every Day Smoker    Packs/day: 1.00    Years: 30.00    Types: Cigarettes    Start date: 09/10/1986  . Smokeless tobacco: Never Used  . Alcohol use No     Current Outpatient Prescriptions:  .  acidophilus (RISAQUAD) CAPS capsule, Take by mouth daily., Disp: , Rfl:  .  clonazePAM (KLONOPIN) 0.5 MG tablet, Take 1 tablet (0.5 mg total) by mouth daily as needed., Disp: 30 tablet, Rfl: 0 .  diclofenac (FLECTOR) 1.3 % PTCH, Place 1 patch onto the skin 2 (two) times daily as needed. For pain, Disp: , Rfl:  .  esomeprazole (NEXIUM) 40 MG capsule, Take 1 capsule by mouth 2 (two) times daily., Disp: , Rfl:  .  methocarbamol (ROBAXIN) 500 MG tablet, Take 1 tablet by mouth at  bedtime., Disp: , Rfl:  .  Pyridoxine HCl (VITAMIN B-6) 250 MG tablet, Take 250 mg by mouth daily., Disp: , Rfl:  .  QUEtiapine (SEROQUEL) 50 MG tablet, Take 1 tablet (50 mg total) by mouth at bedtime., Disp: 90 tablet, Rfl: 1 .  ranitidine (ZANTAC) 150 MG capsule, Take 1 capsule by mouth 2 (two) times daily., Disp: , Rfl: 3 .  RETIN-A MICRO PUMP 0.08 % GEL, APP EXT AA QHS, Disp: , Rfl: 2 .  venlafaxine XR (EFFEXOR-XR) 150 MG 24 hr capsule, TAKE 1 CAPSULE(150 MG) BY MOUTH DAILY WITH BREAKFAST, Disp: 90 capsule, Rfl: 0 .  benzonatate (TESSALON) 100 MG capsule, Take 1 capsule (100 mg total) by mouth 2 (two) times daily as needed for cough., Disp: 30 capsule, Rfl: 0 .  budesonide-formoterol (SYMBICORT) 80-4.5 MCG/ACT inhaler, Inhale 2 puffs into the lungs 2 (two) times daily., Disp: 1 Inhaler, Rfl: 0  Allergies  Allergen Reactions  . Aspirin Other (See Comments)    Causes heartburn even the chewable kind  . Clonidine Derivatives Itching    ROS  Constitutional: Negative for fever or weight change.  Respiratory: Positive for cough, Negative for shortness of breath.   Cardiovascular: Negative for chest pain or palpitations.  Gastrointestinal: Negative for abdominal pain, no bowel changes.  Musculoskeletal: Negative for gait problem or joint swelling.  Skin: Negative for rash.  Neurological: Negative for dizziness, lightheadedness or headache.  No other specific complaints  in a complete review of systems (except as listed in HPI above).  Objective  Vitals:   07/06/16 1508  BP: 114/60  Pulse: 99  Resp: 16  Temp: 97.3 F (36.3 C)  TempSrc: Oral  SpO2: 95%  Weight: 158 lb 14.4 oz (72.1 kg)  Height:  (1.753 m)    Body mass index is 23.47 kg/m.  Nursing Note and Vital Signs reviewed.  Physical Exam  Constitutional: Patient appears well-developed and well-nourished.  No distress.  HEENT: head atraumatic, normocephalic, pupils equal and reactive to light, EOM's intact, TM's  without erythema or bulging, no maxillary or frontal sinus pain on palpation, neck supple without lymphadenopathy, oropharynx pink and moist without exudate Cardiovascular: Normal rate, regular rhythm, S1/S2 present.  No murmur or rub heard. No BLE edema. Pulmonary/Chest: Effort normal and breath sounds with slight expiratory wheeze bilateral upper lobes. No respiratory distress or retractions. Psychiatric: Patient has a normal mood and affect. behavior is normal. Judgment and thought content normal.  No results found for this or any previous visit (from the past 2160 hour(s)).   Assessment & Plan  1. Cough due to bronchospasm  - benzonatate (TESSALON) 100 MG capsule; Take 1 capsule (100 mg total) by mouth 2 (two) times daily as needed for cough.  Dispense: 30 capsule; Refill: 0 - budesonide-formoterol (SYMBICORT) 80-4.5 MCG/ACT inhaler; Inhale 2 puffs into the lungs 2 (two) times daily.  Dispense: 1 Inhaler; Refill: 0  2. Current smoker Smoking Cessation education provided, pt is not ready to quit yet   -Red flags and when to present for emergency care or RTC including fever >101.59F, chest pain, shortness of breath, new/worsening/un-resolving symptoms reviewed with patient at time of visit. Follow up and care instructions discussed and provided in AVS. -Reviewed Health Maintenance:  UTD

## 2016-07-23 IMAGING — US US ABDOMEN LIMITED
1 series · 14 of 25 positions shown · non-contrast
Comparison: Right upper quadrant abdominal ultrasound -02/20/2013 ;
nuclear medicine HIDA scan with augmentation - 10/08/2012

CLINICAL DATA: Right upper quadrant abdominal pain for 2 months.
Nausea.

EXAM:
US ABDOMEN LIMITED - RIGHT UPPER QUADRANT

[Series 1: us abdomen limited · 0.20mm/px · 14 of 35 slices shown]
[im 1/35]
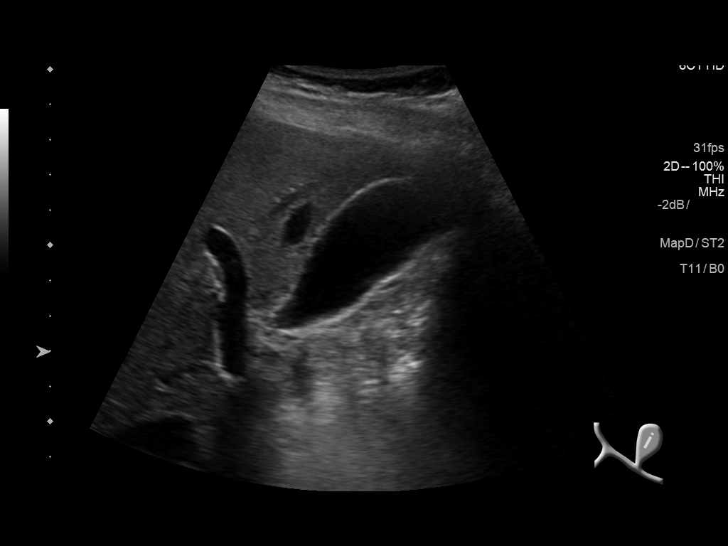
[im 3/35]
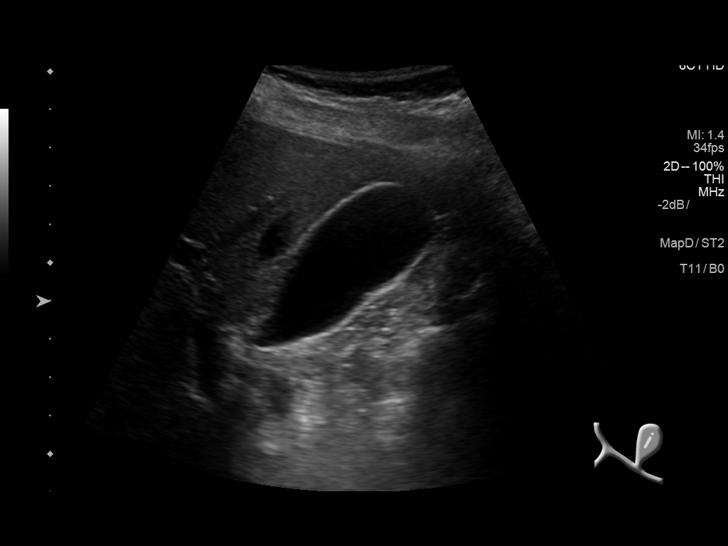
[im 6/35]
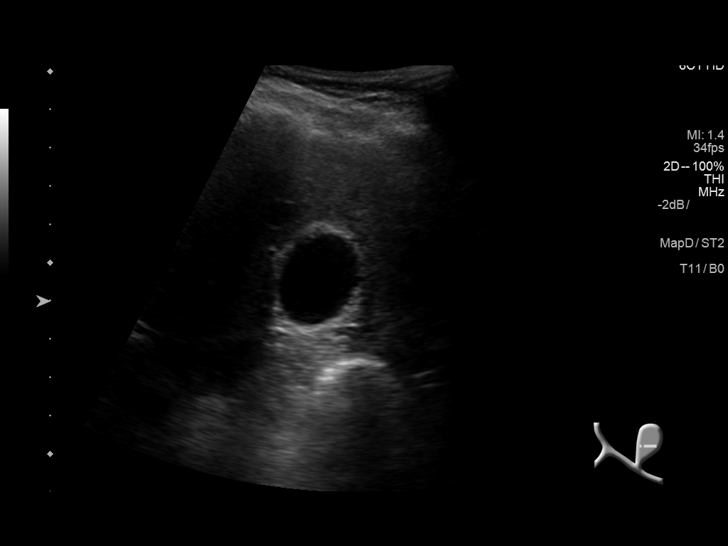
[im 9/35]
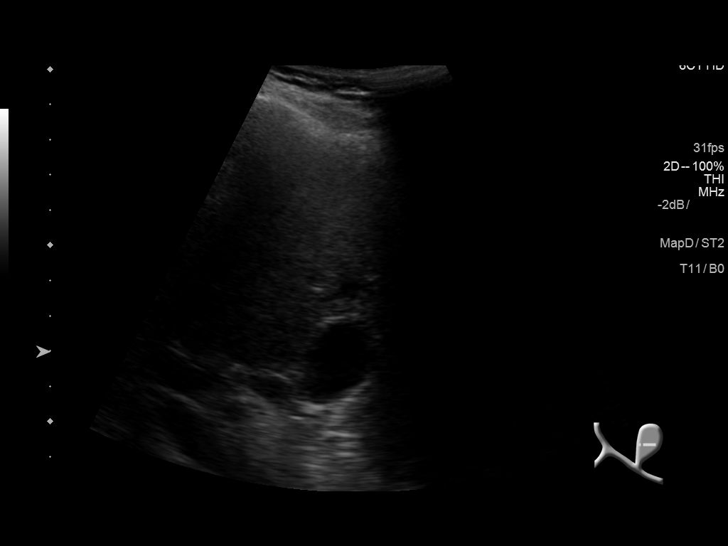
[im 12/35]
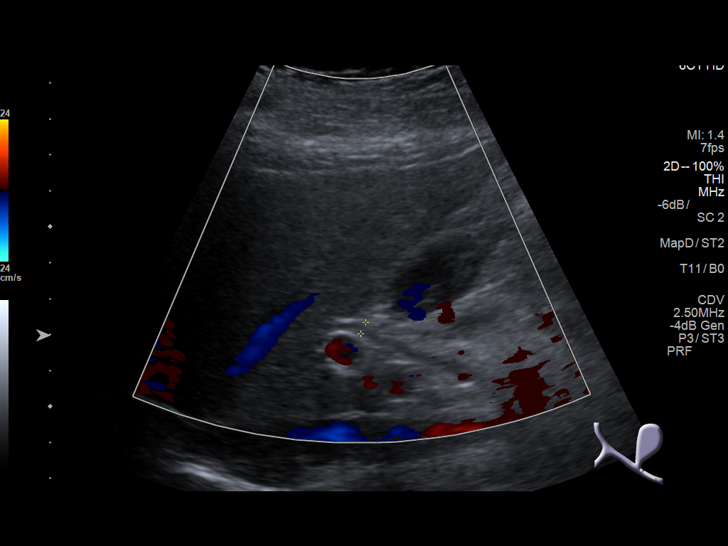
[im 13/35]
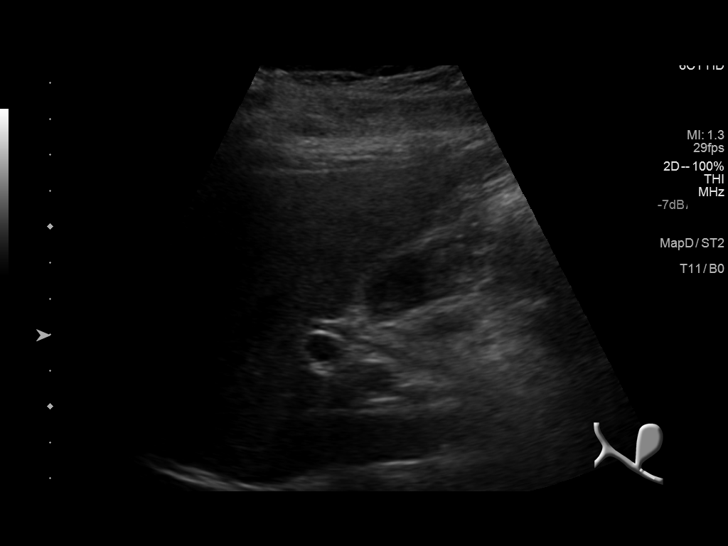
[im 16/35]
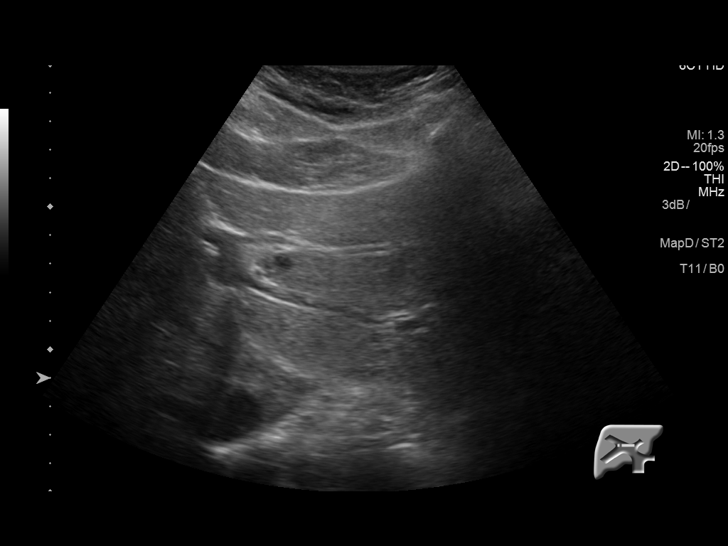
[im 19/35]
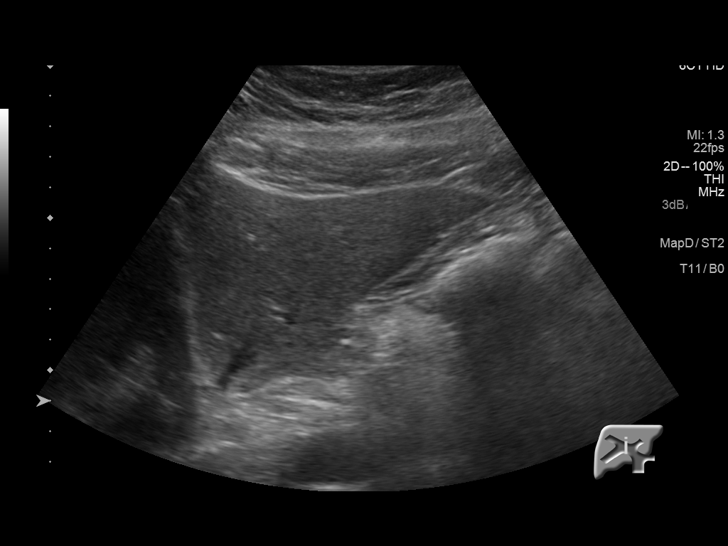
[im 22/35]
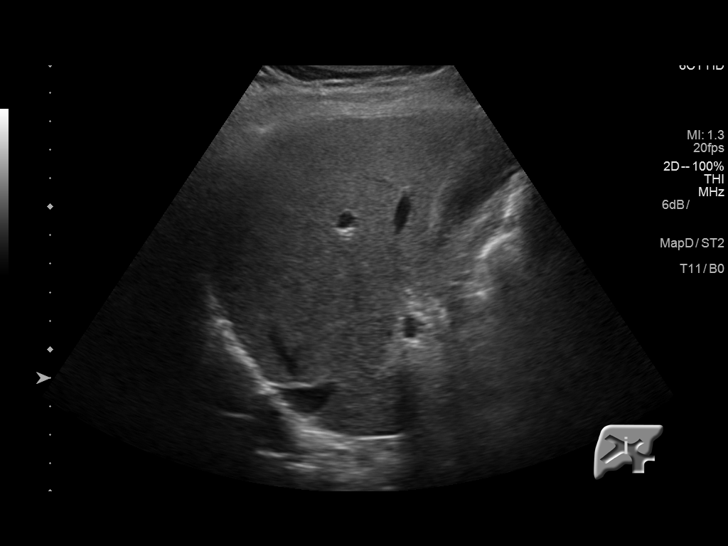
[im 23/35]
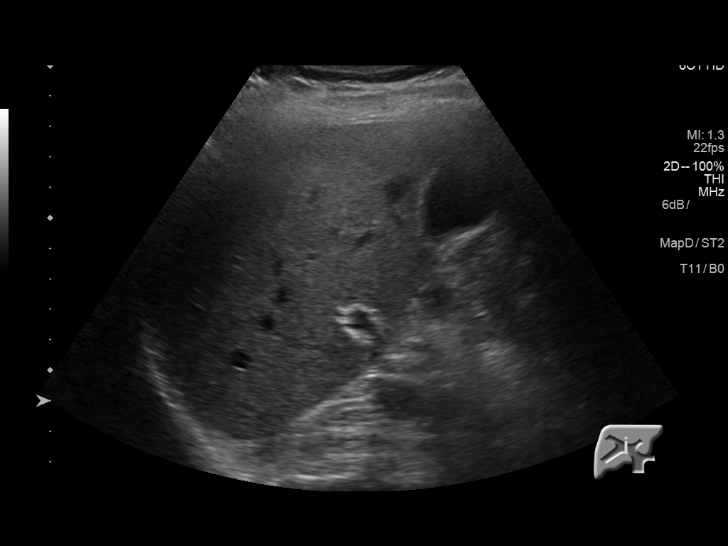
[im 26/35]
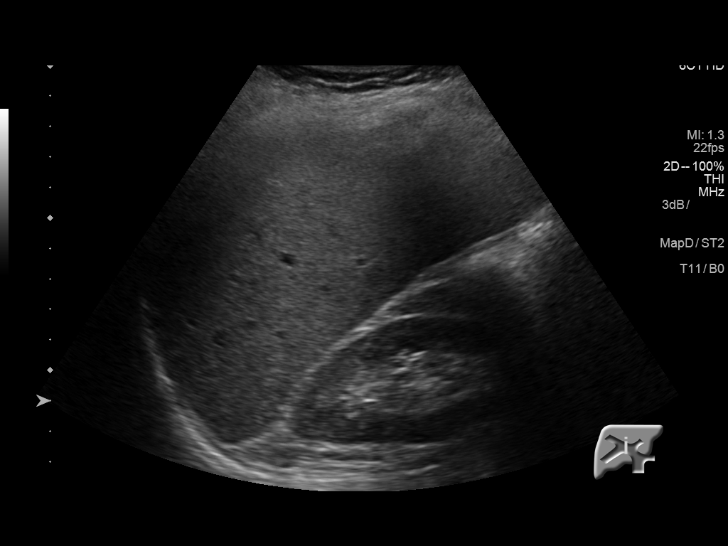
[im 29/35]
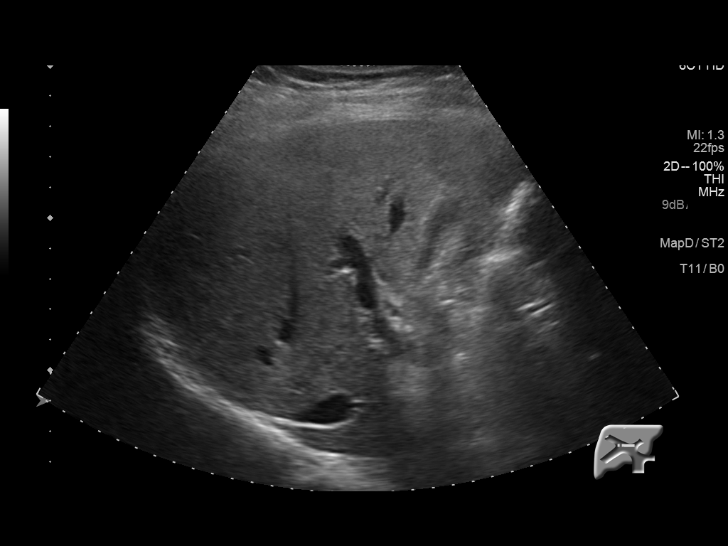
[im 32/35]
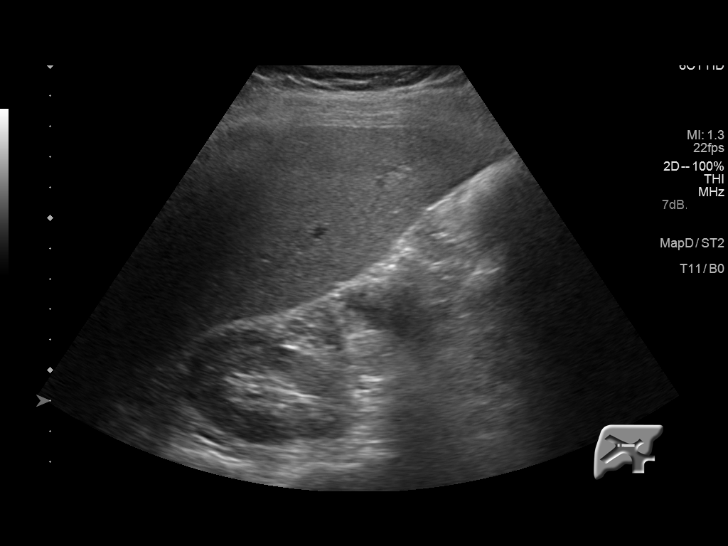
[im 35/35]
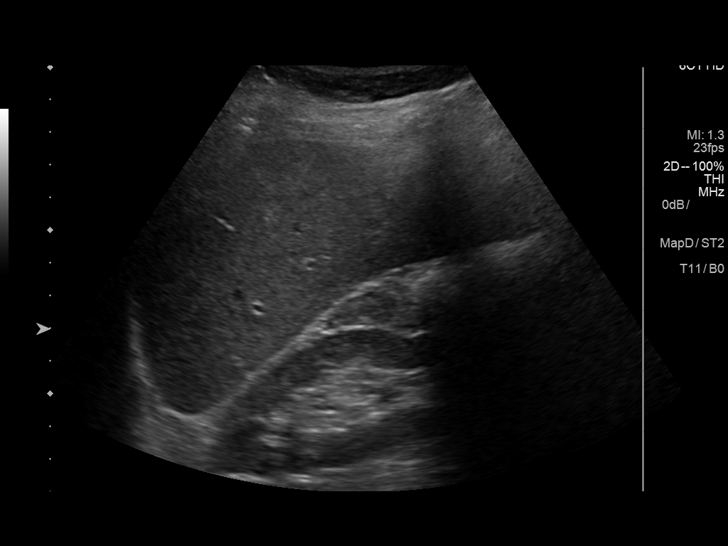

[14 of 25 positions shown; findings below may reference images not displayed]

FINDINGS: Gallbladder:

Sonographically normal. No echogenic gallstones or gall sludge. No
gallbladder wall thickening or pericholecystic fluid. Negative
sonographic Murphy's sign.

Common bile duct:

Diameter: Normal in size measuring 3.5 mm in diameter

Liver:

There is very mild diffuse increased echogenicity of the hepatic
parenchyma (representative image 27). No discrete hepatic lesions.
No intrahepatic biliary duct dilatation. No ascites.
IMPRESSION: Suspected mild hepatic steatosis. Otherwise, normal right upper
quadrant abdominal ultrasound

## 2016-11-01 ENCOUNTER — Other Ambulatory Visit: Payer: Self-pay | Admitting: Family Medicine

## 2016-11-01 DIAGNOSIS — G4700 Insomnia, unspecified: Secondary | ICD-10-CM

## 2016-11-01 DIAGNOSIS — F339 Major depressive disorder, recurrent, unspecified: Secondary | ICD-10-CM

## 2016-11-01 NOTE — Telephone Encounter (Signed)
Patient requesting refill of Seroquel to [facility].

## 2016-11-02 NOTE — Telephone Encounter (Signed)
Patient requesting refill of Effexor to [facility].

## 2016-11-02 NOTE — Telephone Encounter (Signed)
LMOM to inform pt °

## 2016-11-02 NOTE — Telephone Encounter (Signed)
Needs follow-up

## 2016-11-26 ENCOUNTER — Ambulatory Visit: Payer: Medicare Other | Admitting: Family Medicine

## 2016-11-28 ENCOUNTER — Encounter: Payer: Self-pay | Admitting: Family Medicine

## 2016-11-28 ENCOUNTER — Ambulatory Visit (INDEPENDENT_AMBULATORY_CARE_PROVIDER_SITE_OTHER): Payer: Medicare Other | Admitting: Family Medicine

## 2016-11-28 VITALS — BP 118/78 | HR 89 | Temp 98.5°F | Resp 16 | Wt 164.5 lb

## 2016-11-28 DIAGNOSIS — E785 Hyperlipidemia, unspecified: Secondary | ICD-10-CM

## 2016-11-28 DIAGNOSIS — G9050 Complex regional pain syndrome I, unspecified: Secondary | ICD-10-CM | POA: Diagnosis not present

## 2016-11-28 DIAGNOSIS — K219 Gastro-esophageal reflux disease without esophagitis: Secondary | ICD-10-CM | POA: Diagnosis not present

## 2016-11-28 DIAGNOSIS — G4700 Insomnia, unspecified: Secondary | ICD-10-CM | POA: Diagnosis not present

## 2016-11-28 DIAGNOSIS — Z79899 Other long term (current) drug therapy: Secondary | ICD-10-CM

## 2016-11-28 DIAGNOSIS — Z23 Encounter for immunization: Secondary | ICD-10-CM | POA: Diagnosis not present

## 2016-11-28 DIAGNOSIS — F17200 Nicotine dependence, unspecified, uncomplicated: Secondary | ICD-10-CM | POA: Diagnosis not present

## 2016-11-28 DIAGNOSIS — D692 Other nonthrombocytopenic purpura: Secondary | ICD-10-CM | POA: Diagnosis not present

## 2016-11-28 DIAGNOSIS — F339 Major depressive disorder, recurrent, unspecified: Secondary | ICD-10-CM

## 2016-11-28 DIAGNOSIS — J40 Bronchitis, not specified as acute or chronic: Secondary | ICD-10-CM | POA: Diagnosis not present

## 2016-11-28 DIAGNOSIS — K2270 Barrett's esophagus without dysplasia: Secondary | ICD-10-CM

## 2016-11-28 MED ORDER — VENLAFAXINE HCL ER 150 MG PO CP24: 150.0000 mg | ORAL_CAPSULE | Freq: Every day | ORAL | 1 refills | Status: DC

## 2016-11-28 MED ORDER — UMECLIDINIUM-VILANTEROL 62.5-25 MCG/INH IN AEPB: 1.0000 | INHALATION_SPRAY | Freq: Every day | RESPIRATORY_TRACT | 2 refills | Status: DC

## 2016-11-28 NOTE — Progress Notes (Signed)
Name: Tara [name]   MRN: [id number]    DOB: [birth year]   Date:11/28/2016       Progress Note  Subjective  Chief Complaint  Chief Complaint  Patient presents with  . Follow-up  . Medication Refill    HPI  GERD/Barrett's esophagus: taking Nexium and Ranitidine twice daily, doing better but still has symptoms of heartburn on her throat, and occasional regurgitation. She could not tolerate aspirin because it caused worsening of symptoms.  She is due for repeat EGD in 2019, and it has already been scheduled for Feb by Dr. Marva Panda  Major Depression: she states she is in remission.  She bought a cottage in [place] this year, and going to the mountains on a regular basis and hanging out with her grandson gives her joy. She states she does not want to wean off medication, explained to her there is no need to stop Effexor. Mother is doing better, finished radiation and chemotherapy for ovarian cancer. She has been more active ( walking) and enjoy the outdoors.   Insomnia: she has been off Ambien and states able to sleep on 50 mg of Seroquel but 25 mg only gets her about 3 hours of sleep. She denies side effects of medication   Dyslipidemia: on diet only, last HDL was low and LDL was slightly better at 132.   Reflex Sympathetic Dystrophy: she has been on disability since 2010, for RSD right arm and chronic back pain. She still has visits with pain clinic and gets epidurals injection, off pain medications. Full Medicare coverage since July 2017. She is doing well, and slowly increasing physical activity and back has been stable  Tobacco use and SOB: she had to be treated for bronchitis in June 2018 and states that for the past fw days she wakes up feeling some SOB and resolves with Symbicort ( given to her in May), she states cough is mild and chronic, no fever or chills. Not ready to quit smoking yet. Explained progression from acute bronchitis, to chronic bronchitis, COPD/emphysema  Patient  Active Problem List   Diagnosis Date Noted  . Fatty liver 01/28/2015  . Barrett esophagus 09/10/2014  . Major depression, recurrent, chronic (HCC) 09/08/2014  . Insomnia, persistent 09/08/2014  . Arthralgia, sacroiliac 09/08/2014  . Current smoker 09/08/2014  . Degeneration of lumbar or lumbosacral intervertebral disc 09/08/2014  . Gastro-esophageal reflux disease without esophagitis 09/08/2014  . Perennial allergic rhinitis 09/08/2014  . Nerve root disorder 09/08/2014  . Reflex sympathetic dystrophy 09/08/2014  . Retained orthopedic hardware 01/15/2012  . Chronic pain 01/25/2011    Past Surgical History:  Procedure Laterality Date  . ABDOMINAL HYSTERECTOMY  2005   partial  . CERVICAL DISC ARTHROPLASTY  2008  . KNEE ARTHROSCOPY Right 2008  . LUMBAR LAMINECTOMY/DECOMPRESSION MICRODISCECTOMY  01/25/2011   Procedure: LUMBAR LAMINECTOMY/DECOMPRESSION MICRODISCECTOMY;  Surgeon: Alvy Beal;  Location: [facility] OR;  Service: Orthopedics;  Laterality: Left;  LEFT L5-S1 MICRODISCECTOMY, Central Decompression Lumbar five-sacral one  . OOPHORECTOMY Left 2011  . OOPHORECTOMY Right 10/2012  . SPINAL CORD STIMULATOR IMPLANT  2009   Removal-01/2012  . SPINAL CORD STIMULATOR REMOVAL  01/17/2012   Procedure: LUMBAR SPINAL CORD STIMULATOR REMOVAL;  Surgeon: Venita Lick, MD;  Location: [facility] OR;  Service: Orthopedics;  Laterality: N/A;  Spinal Cord Battery Removal  . TUBAL LIGATION  1988    Family History  Problem Relation Age of Onset  . Depression Mother   . Ovarian cancer Mother   . Hypertension Mother   .  Hypercholesterolemia Mother   . Obesity Mother   . Hypertension Sister   . Obesity Sister     Social History   Social History  . Marital status: Divorced    Spouse name: N/A  . Number of children: N/A  . Years of education: N/A   Occupational History  .  Unemployed   Social History Main Topics  . Smoking status: Current Every Day Smoker    Packs/day: 1.00    Years: 30.00     Types: Cigarettes    Start date: 09/10/1986  . Smokeless tobacco: Never Used  . Alcohol use No  . Drug use: No  . Sexual activity: Yes    Partners: Male    Birth control/ protection: Post-menopausal   Other Topics Concern  . Not on file   Social History Narrative  . No narrative on file     Current Outpatient Prescriptions:  .  clonazePAM (KLONOPIN) 0.5 MG tablet, Take 1 tablet (0.5 mg total) by mouth daily as needed., Disp: 30 tablet, Rfl: 0 .  diclofenac (FLECTOR) 1.3 % PTCH, Place 1 patch onto the skin 2 (two) times daily as needed. For pain, Disp: , Rfl:  .  esomeprazole (NEXIUM) 40 MG capsule, Take 1 capsule by mouth 2 (two) times daily., Disp: , Rfl:  .  methocarbamol (ROBAXIN) 500 MG tablet, Take 1 tablet by mouth at bedtime., Disp: , Rfl:  .  QUEtiapine (SEROQUEL) 50 MG tablet, TAKE 1 TABLET BY MOUTH EVERY NIGHT AT BEDTIME, Disp: 90 tablet, Rfl: 0 .  ranitidine (ZANTAC) 150 MG capsule, Take 1 capsule by mouth 2 (two) times daily., Disp: , Rfl: 3 .  RETIN-A MICRO PUMP 0.08 % GEL, APP EXT AA QHS, Disp: , Rfl: 2 .  venlafaxine XR (EFFEXOR-XR) 150 MG 24 hr capsule, Take 1 capsule (150 mg total) by mouth daily with breakfast., Disp: 90 capsule, Rfl: 1 .  acidophilus (RISAQUAD) CAPS capsule, Take by mouth daily., Disp: , Rfl:  .  budesonide-formoterol (SYMBICORT) 80-4.5 MCG/ACT inhaler, Inhale 2 puffs into the lungs 2 (two) times daily. (Patient not taking: Reported on 11/28/2016), Disp: 1 Inhaler, Rfl: 0 .  Pyridoxine HCl (VITAMIN B-6) 250 MG tablet, Take 250 mg by mouth daily., Disp: , Rfl:   Allergies  Allergen Reactions  . Aspirin Other (See Comments)    Causes heartburn even the chewable kind  . Clonidine Derivatives Itching     ROS  Constitutional: Negative for fever or weight change.  Respiratory: Negative for cough and shortness of breath.   Cardiovascular: Negative for chest pain or palpitations.  Gastrointestinal: Negative for abdominal pain, no bowel changes.   Musculoskeletal: Negative for gait problem or joint swelling.  Skin: Negative for rash.  Neurological: Negative for dizziness or headache.  No other specific complaints in a complete review of systems (except as listed in HPI above).  Objective  Vitals:   11/28/16 1143  BP: 118/78  Pulse: 89  Resp: 16  Temp: 98.5 F (36.9 C)  TempSrc: Oral  SpO2: 98%  Weight: 164 lb 8 oz (74.6 kg)    Body mass index is 24.29 kg/m.  Physical Exam  Constitutional: Patient appears well-developed and well-nourished. Obese  No distress.  HEENT: head atraumatic, normocephalic, pupils equal and reactive to light, neck supple, throat within normal limits Cardiovascular: Normal rate, regular rhythm and normal heart sounds.  No murmur heard. No BLE edema. Pulmonary/Chest: Effort normal and breath sounds normal. No respiratory distress. Abdominal: Soft.  There is no tenderness.  Psychiatric: Patient has a normal mood and affect. behavior is normal. Judgment and thought content normal.   PHQ2/9: Depression screen [facility] 2/9 11/28/2016 07/06/2016 04/26/2016 01/17/2016 07/26/2015  Decreased Interest 0 0 0 0 0  Down, Depressed, Hopeless 0 0 0 0 0  PHQ - 2 Score 0 0 0 0 0    Fall Risk: Fall Risk  11/28/2016 07/06/2016 04/26/2016 01/17/2016 01/18/2015  Falls in the past year? No No No No Yes  Number falls in past yr: - - - - 1  Injury with Fall? - - - - Yes     Functional Status Survey: Is the patient deaf or have difficulty hearing?: No Does the patient have difficulty seeing, even when wearing glasses/contacts?: No Does the patient have difficulty concentrating, remembering, or making decisions?: No Does the patient have difficulty walking or climbing stairs?: No Does the patient have difficulty dressing or bathing?: No Does the patient have difficulty doing errands alone such as visiting a doctor's office or shopping?: No    Assessment & Plan  1. Dyslipidemia  - Lipid panel  2. Major depression,  recurrent, chronic (HCC)  - venlafaxine XR (EFFEXOR-XR) 150 MG 24 hr capsule; Take 1 capsule (150 mg total) by mouth daily with breakfast.  Dispense: 90 capsule; Refill: 1  3. Needs flu shot  - Flu Vaccine QUAD 6+ mos PF IM (Fluarix Quad PF)  4. Insomnia, persistent  Continue Seroquel   5. Purpura, nonthrombocytopenic (HCC)  stable  6. Long-term use of high-risk medication  - CBC with Differential/Platelet - COMPLETE METABOLIC PANEL WITH GFR  7. Gastro-esophageal reflux disease without esophagitis   8. Barrett's esophagus without dysplasia  Keep follow up with Dr. Marva Panda  9. Reflex sympathetic dystrophy  stable  10. Current smoker  - Spirometry: Pre & Post Eval Pre showed FEV1/FVC of 80% post 82%, she has components of chronic bronchitis, we will give her a rx of Anoro to take prn for now - umeclidinium-vilanterol (ANORO ELLIPTA) 62.5-25 MCG/INH AEPB; Inhale 1 puff into the lungs daily.  Dispense: 60 each; Refill: 2  11. Bronchitis  - umeclidinium-vilanterol (ANORO ELLIPTA) 62.5-25 MCG/INH AEPB; Inhale 1 puff into the lungs daily.  Dispense: 60 each; Refill: 2

## 2016-11-29 LAB — CBC WITH DIFFERENTIAL/PLATELET
BASOS PCT: 1.1 %
Basophils Absolute: 94 cells/uL (ref 0–200)
EOS ABS: 1029 {cells}/uL — AB (ref 15–500)
Eosinophils Relative: 12.1 %
HCT: 41.7 % (ref 35.0–45.0)
HEMOGLOBIN: 14 g/dL (ref 11.7–15.5)
LYMPHS ABS: 3562 {cells}/uL (ref 850–3900)
MCH: 31.5 pg (ref 27.0–33.0)
MCHC: 33.6 g/dL (ref 32.0–36.0)
MCV: 93.7 fL (ref 80.0–100.0)
MONOS PCT: 8.6 %
MPV: 9.9 fL (ref 7.5–12.5)
NEUTROS ABS: 3086 {cells}/uL (ref 1500–7800)
Neutrophils Relative %: 36.3 %
Platelets: 320 10*3/uL (ref 140–400)
RBC: 4.45 10*6/uL (ref 3.80–5.10)
RDW: 12.6 % (ref 11.0–15.0)
Total Lymphocyte: 41.9 %
WBC: 8.5 10*3/uL (ref 3.8–10.8)
WBCMIX: 731 {cells}/uL (ref 200–950)

## 2016-11-29 LAB — COMPLETE METABOLIC PANEL WITH GFR
AG Ratio: 1.4 (calc) (ref 1.0–2.5)
ALBUMIN MSPROF: 4.2 g/dL (ref 3.6–5.1)
ALT: 14 U/L (ref 6–29)
AST: 17 U/L (ref 10–35)
Alkaline phosphatase (APISO): 94 U/L (ref 33–130)
BUN: 17 mg/dL (ref 7–25)
CO2: 26 mmol/L (ref 20–32)
CREATININE: 0.92 mg/dL (ref 0.50–1.05)
Calcium: 9.5 mg/dL (ref 8.6–10.4)
Chloride: 102 mmol/L (ref 98–110)
GFR, EST AFRICAN AMERICAN: 83 mL/min/{1.73_m2} (ref >=60)
GFR, Est Non African American: 72 mL/min/{1.73_m2} (ref >=60)
GLUCOSE: 94 mg/dL (ref 65–99)
Globulin: 3 g/dL (calc) (ref 1.9–3.7)
Potassium: 4.7 mmol/L (ref 3.5–5.3)
Sodium: 137 mmol/L (ref 135–146)
TOTAL PROTEIN: 7.2 g/dL (ref 6.1–8.1)
Total Bilirubin: 0.5 mg/dL (ref 0.2–1.2)

## 2016-11-29 LAB — LIPID PANEL
CHOL/HDL RATIO: 4.4 (calc) (ref <5.0)
Cholesterol: 226 mg/dL — ABNORMAL HIGH (ref <200)
HDL: 51 mg/dL (ref >50)
LDL CHOLESTEROL (CALC): 151 mg/dL — AB
NON-HDL CHOLESTEROL (CALC): 175 mg/dL — AB (ref <130)
Triglycerides: 118 mg/dL (ref <150)

## 2016-12-24 ENCOUNTER — Other Ambulatory Visit: Payer: Self-pay | Admitting: Family Medicine

## 2017-02-12 SURGERY — ESOPHAGOGASTRODUODENOSCOPY (EGD) WITH PROPOFOL
Anesthesia: General

## 2017-03-28 ENCOUNTER — Other Ambulatory Visit: Payer: Self-pay | Admitting: Family Medicine

## 2017-03-28 DIAGNOSIS — G4700 Insomnia, unspecified: Secondary | ICD-10-CM

## 2017-04-15 ENCOUNTER — Ambulatory Visit: Admit: 2017-04-15 | Payer: BC Managed Care – PPO | Admitting: Gastroenterology

## 2017-04-15 SURGERY — ESOPHAGOGASTRODUODENOSCOPY (EGD) WITH PROPOFOL
Anesthesia: General

## 2017-05-08 ENCOUNTER — Other Ambulatory Visit: Payer: Self-pay | Admitting: Family Medicine

## 2017-05-08 DIAGNOSIS — F339 Major depressive disorder, recurrent, unspecified: Secondary | ICD-10-CM

## 2017-05-17 ENCOUNTER — Ambulatory Visit: Payer: Medicare Other | Admitting: Family Medicine

## 2017-05-17 ENCOUNTER — Encounter: Payer: Self-pay | Admitting: Family Medicine

## 2017-05-17 ENCOUNTER — Ambulatory Visit (INDEPENDENT_AMBULATORY_CARE_PROVIDER_SITE_OTHER): Payer: Medicare Other

## 2017-05-17 VITALS — BP 106/66 | HR 70 | Temp 98.3°F | Resp 12 | Ht 69.0 in | Wt 171.7 lb

## 2017-05-17 DIAGNOSIS — K2270 Barrett's esophagus without dysplasia: Secondary | ICD-10-CM

## 2017-05-17 DIAGNOSIS — G4700 Insomnia, unspecified: Secondary | ICD-10-CM

## 2017-05-17 DIAGNOSIS — G9050 Complex regional pain syndrome I, unspecified: Secondary | ICD-10-CM

## 2017-05-17 DIAGNOSIS — Z1231 Encounter for screening mammogram for malignant neoplasm of breast: Secondary | ICD-10-CM | POA: Diagnosis not present

## 2017-05-17 DIAGNOSIS — Z1239 Encounter for other screening for malignant neoplasm of breast: Secondary | ICD-10-CM

## 2017-05-17 DIAGNOSIS — J449 Chronic obstructive pulmonary disease, unspecified: Secondary | ICD-10-CM

## 2017-05-17 DIAGNOSIS — E785 Hyperlipidemia, unspecified: Secondary | ICD-10-CM

## 2017-05-17 DIAGNOSIS — F339 Major depressive disorder, recurrent, unspecified: Secondary | ICD-10-CM | POA: Diagnosis not present

## 2017-05-17 DIAGNOSIS — Z0000 Encounter for general adult medical examination without abnormal findings: Secondary | ICD-10-CM

## 2017-05-17 DIAGNOSIS — D692 Other nonthrombocytopenic purpura: Secondary | ICD-10-CM

## 2017-05-17 DIAGNOSIS — Z23 Encounter for immunization: Secondary | ICD-10-CM

## 2017-05-17 MED ORDER — QUETIAPINE FUMARATE 25 MG PO TABS: 25.0000 mg | ORAL_TABLET | Freq: Every day | ORAL | 1 refills | Status: DC

## 2017-05-17 MED ORDER — VENLAFAXINE HCL ER 150 MG PO CP24: 150.0000 mg | ORAL_CAPSULE | Freq: Every day | ORAL | 1 refills | Status: DC

## 2017-05-17 MED ORDER — UMECLIDINIUM-VILANTEROL 62.5-25 MCG/INH IN AEPB: 1.0000 | INHALATION_SPRAY | Freq: Every day | RESPIRATORY_TRACT | 2 refills | Status: DC

## 2017-05-17 MED ORDER — ZOSTER VAC RECOMB ADJUVANTED 50 MCG/0.5ML IM SUSR: 0.5000 mL | Freq: Once | INTRAMUSCULAR | 1 refills | Status: AC

## 2017-05-17 NOTE — Patient Instructions (Addendum)
Tara [name] , Thank you for taking time to come for your Medicare Wellness Visit. I appreciate your ongoing commitment to your health goals. Please review the following plan we discussed and let me know if I can assist you in the future.   Screening recommendations/referrals: Colorectal Screening: Completed colonoscopy 04/22/14. Repeat every 10 years Mammogram: Completed 06/05/16. Repeat every year. Please schedule your mammogram on or after 06/05/17. Bone Density: Completed 06/05/16. Osteoporotic screenings no longer required. Lung Cancer Screening: You do not qualify for this screening Hepatitis C Screening: Completed 07/25/12 HIV/Syphilis/Hepatitis B Screening: You do not qualify for this screening   Vision/Dental Exams: Recommended yearly ophthalmology/optometry visit for glaucoma screening and checkup Recommended yearly dental visit for hygiene and checkup  Vaccinations: Influenza vaccine: Up to date Pneumococcal vaccine: Completed series Tdap vaccine: Up to date Shingles vaccine: Please call your insurance company to determine your out of pocket expense for the Shingrix vaccine. You may also receive this vaccine at your local pharmacy or Health Dept.    Advanced directives: Advance directive discussed with you today. I have provided a copy for you to complete at home and have notarized. Once this is complete please bring a copy in to our office so we can scan it into your chart.  Conditions/risks identified: Recommend to drink at least 6-8 8oz glasses of water per day.  Next appointment: Please schedule your Annual Wellness Visit with your Nurse Health Advisor in one year.  Preventive Care 40-64 Years, Female Preventive care refers to lifestyle choices and visits with your health care provider that can promote health and wellness. What does preventive care include?  A yearly physical exam. This is also called an annual well check.  Dental exams once or twice a year.  Routine eye  exams. Ask your health care provider how often you should have your eyes checked.  Personal lifestyle choices, including:  Daily care of your teeth and gums.  Regular physical activity.  Eating a healthy diet.  Avoiding tobacco and drug use.  Limiting alcohol use.  Practicing safe sex.  Taking low-dose aspirin daily starting at age 64.  Taking vitamin and mineral supplements as recommended by your health care provider. What happens during an annual well check? The services and screenings done by your health care provider during your annual well check will depend on your age, overall health, lifestyle risk factors, and family history of disease. Counseling  Your health care provider may ask you questions about your:  Alcohol use.  Tobacco use.  Drug use.  Emotional well-being.  Home and relationship well-being.  Sexual activity.  Eating habits.  Work and work Statistician.  Method of birth control.  Menstrual cycle.  Pregnancy history. Screening  You may have the following tests or measurements:  Height, weight, and BMI.  Blood pressure.  Lipid and cholesterol levels. These may be checked every 5 years, or more frequently if you are over 93 years old.  Skin check.  Lung cancer screening. You may have this screening every year starting at age 35 if you have a 30-pack-year history of smoking and currently smoke or have quit within the past 15 years.  Fecal occult blood test (FOBT) of the stool. You may have this test every year starting at age 11.  Flexible sigmoidoscopy or colonoscopy. You may have a sigmoidoscopy every 5 years or a colonoscopy every 10 years starting at age 71.  Hepatitis C blood test.  Hepatitis B blood test.  Sexually transmitted disease (STD) testing.  Diabetes screening. This is done by checking your blood sugar (glucose) after you have not eaten for a while (fasting). You may have this done every 1-3 years.  Mammogram. This may  be done every 1-2 years. Talk to your health care provider about when you should start having regular mammograms. This may depend on whether you have a family history of breast cancer.  BRCA-related cancer screening. This may be done if you have a family history of breast, ovarian, tubal, or peritoneal cancers.  Pelvic exam and Pap test. This may be done every 3 years starting at age 62. Starting at age 79, this may be done every 5 years if you have a Pap test in combination with an HPV test.  Bone density scan. This is done to screen for osteoporosis. You may have this scan if you are at high risk for osteoporosis. Discuss your test results, treatment options, and if necessary, the need for more tests with your health care provider. Vaccines  Your health care provider may recommend certain vaccines, such as:  Influenza vaccine. This is recommended every year.  Tetanus, diphtheria, and acellular pertussis (Tdap, Td) vaccine. You may need a Td booster every 10 years.  Zoster vaccine. You may need this after age 58.  Pneumococcal 13-valent conjugate (PCV13) vaccine. You may need this if you have certain conditions and were not previously vaccinated.  Pneumococcal polysaccharide (PPSV23) vaccine. You may need one or two doses if you smoke cigarettes or if you have certain conditions. Talk to your health care provider about which screenings and vaccines you need and how often you need them. This information is not intended to replace advice given to you by your health care provider. Make sure you discuss any questions you have with your health care provider. Document Released: 03/25/2015 Document Revised: 11/16/2015 Document Reviewed: 12/28/2014 Elsevier Interactive Patient Education  2017 [place] Prevention in the Home Falls can cause injuries. They can happen to people of all ages. There are many things you can do to make your home safe and to help prevent falls. What can I  do on the outside of my home?  Regularly fix the edges of walkways and driveways and fix any cracks.  Remove anything that might make you trip as you walk through a door, such as a raised step or threshold.  Trim any bushes or trees on the path to your home.  Use bright outdoor lighting.  Clear any walking paths of anything that might make someone trip, such as rocks or tools.  Regularly check to see if handrails are loose or broken. Make sure that both sides of any steps have handrails.  Any raised decks and porches should have guardrails on the edges.  Have any leaves, snow, or ice cleared regularly.  Use sand or salt on walking paths during winter.  Clean up any spills in your garage right away. This includes oil or grease spills. What can I do in the bathroom?  Use night lights.  Install grab bars by the toilet and in the tub and shower. Do not use towel bars as grab bars.  Use non-skid mats or decals in the tub or shower.  If you need to sit down in the shower, use a plastic, non-slip stool.  Keep the floor dry. Clean up any water that spills on the floor as soon as it happens.  Remove soap buildup in the tub or shower regularly.  Attach bath mats securely  with double-sided non-slip rug tape.  Do not have throw rugs and other things on the floor that can make you trip. What can I do in the bedroom?  Use night lights.  Make sure that you have a light by your bed that is easy to reach.  Do not use any sheets or blankets that are too big for your bed. They should not hang down onto the floor.  Have a firm chair that has side arms. You can use this for support while you get dressed.  Do not have throw rugs and other things on the floor that can make you trip. What can I do in the kitchen?  Clean up any spills right away.  Avoid walking on wet floors.  Keep items that you use a lot in easy-to-reach places.  If you need to reach something above you, use a  strong step stool that has a grab bar.  Keep electrical cords out of the way.  Do not use floor polish or wax that makes floors slippery. If you must use wax, use non-skid floor wax.  Do not have throw rugs and other things on the floor that can make you trip. What can I do with my stairs?  Do not leave any items on the stairs.  Make sure that there are handrails on both sides of the stairs and use them. Fix handrails that are broken or loose. Make sure that handrails are as long as the stairways.  Check any carpeting to make sure that it is firmly attached to the stairs. Fix any carpet that is loose or worn.  Avoid having throw rugs at the top or bottom of the stairs. If you do have throw rugs, attach them to the floor with carpet tape.  Make sure that you have a light switch at the top of the stairs and the bottom of the stairs. If you do not have them, ask someone to add them for you. What else can I do to help prevent falls?  Wear shoes that:  Do not have high heels.  Have rubber bottoms.  Are comfortable and fit you well.  Are closed at the toe. Do not wear sandals.  If you use a stepladder:  Make sure that it is fully opened. Do not climb a closed stepladder.  Make sure that both sides of the stepladder are locked into place.  Ask someone to hold it for you, if possible.  Clearly mark and make sure that you can see:  Any grab bars or handrails.  First and last steps.  Where the edge of each step is.  Use tools that help you move around (mobility aids) if they are needed. These include:  Canes.  Walkers.  Scooters.  Crutches.  Turn on the lights when you go into a dark area. Replace any light bulbs as soon as they burn out.  Set up your furniture so you have a clear path. Avoid moving your furniture around.  If any of your floors are uneven, fix them.  If there are any pets around you, be aware of where they are.  Review your medicines with your  doctor. Some medicines can make you feel dizzy. This can increase your chance of falling. Ask your doctor what other things that you can do to help prevent falls. This information is not intended to replace advice given to you by your health care provider. Make sure you discuss any questions you have with your health care  provider. Document Released: 12/23/2008 Document Revised: 08/04/2015 Document Reviewed: 04/02/2014 Elsevier Interactive Patient Education  2017 [place] provider would like to you have your annual eye exam. Please contact your current eye doctor to schedule your annual eye exam to evaluate the health of your eyes. If you have not yet established care with a physician to evaluate the health of your eyes, below is a list of physicians for you to contact and to establish care.   [facility] Address: [address] [place], [place] [address]   Address: [address] [place], [place] [address]  Phone: [phone]      Phone: [phone]  Website: [email or web address]    Website: [email or web address]     [facility]  Address: [address], [place], [place] [address]   Address: [address], [place], [place] [address] Phone: [phone]      Phone: [phone]    [facility] Address: [address], [place], [place] [address]  Phone: [phone]

## 2017-05-17 NOTE — Progress Notes (Addendum)
Subjective:   Tara [name] is a 52 y.o. female who presents for an Initial Medicare Annual Wellness Visit.  Review of Systems    N/A  Cardiac Risk Factors include: smoking/ tobacco exposure     Objective:    Today's Vitals   05/17/17 1045  BP: 106/66  Pulse: 70  Resp: 12  Temp: 98.3 F (36.8 C)  TempSrc: Oral  SpO2: 96%  Weight: 171 lb 11.2 oz (77.9 kg)  Height: 5\' 9"  (1.753 m)   Body mass index is 25.36 kg/m.  Advanced Directives 05/17/2017 11/28/2016 07/06/2016 04/26/2016 01/17/2016 10/04/2015 07/26/2015  Does Patient Have a Medical Advance Directive? No No No No No No No  Would patient like information on creating a medical advance directive? Yes (MAU/Ambulatory/Procedural Areas - Information given) - - - - No - patient declined information No - patient declined information  Pre-existing out of facility DNR order (yellow form or pink MOST form) - - - - - - -    Current Medications (verified) Outpatient Encounter Medications as of 05/17/2017  Medication Sig  . diclofenac (FLECTOR) 1.3 % PTCH Place 1 patch onto the skin 2 (two) times daily as needed. For pain  . diphenhydrAMINE (BENADRYL) 25 mg capsule Take 25 mg by mouth every 6 (six) hours as needed.  Marland Kitchen esomeprazole (NEXIUM) 40 MG capsule Take 1 capsule by mouth 2 (two) times daily.  . QUEtiapine (SEROQUEL) 50 MG tablet TAKE 1 TABLET BY MOUTH EVERY NIGHT AT BEDTIME  . ranitidine (ZANTAC) 150 MG capsule Take 1 capsule by mouth 2 (two) times daily.  Marland Kitchen umeclidinium-vilanterol (ANORO ELLIPTA) 62.5-25 MCG/INH AEPB Inhale 1 puff into the lungs daily.  Marland Kitchen venlafaxine XR (EFFEXOR-XR) 150 MG 24 hr capsule Take 1 capsule (150 mg total) by mouth daily with breakfast.  . acidophilus (RISAQUAD) CAPS capsule Take by mouth daily.  . clonazePAM (KLONOPIN) 0.5 MG tablet Take 1 tablet (0.5 mg total) by mouth daily as needed. (Patient not taking: Reported on 05/17/2017)  . methocarbamol (ROBAXIN) 500 MG tablet Take 1 tablet by mouth at  bedtime.  . Pyridoxine HCl (VITAMIN B-6) 250 MG tablet Take 250 mg by mouth daily.  Marland Kitchen RETIN-A MICRO PUMP 0.08 % GEL APP EXT AA QHS   No facility-administered encounter medications on file as of 05/17/2017.     Allergies (verified) Aspirin and Clonidine derivatives   Hospitalizations, surgeries, and ER visits occurring within the previous 12 months:  Within the previous 12 months, pt has not underwent any surgical procedures, has not been hospitalized for any conditions and has not been treated by an emergency room clinician.  NOTE: does have and upcoming EGD scheduled on 06/03/17 with Dr. Marva Panda  History: Past Medical History:  Diagnosis Date  . Arthritis    arthritis in back  . COPD (chronic obstructive pulmonary disease) (HCC)    uses spiriva  . CPRS 1 (complex regional pain syndrome I) of upper limb 2007   formerly called rsd  . Depression    being treated for depression  . Headache(784.0)    headaches due to pain  . Neuromuscular disorder (HCC)    rsd  . Pneumonia    2011 - hospitalized for 4 days   Past Surgical History:  Procedure Laterality Date  . ABDOMINAL HYSTERECTOMY  2005   partial  . CERVICAL DISC ARTHROPLASTY  2008  . KNEE ARTHROSCOPY Right 2008  . LUMBAR LAMINECTOMY/DECOMPRESSION MICRODISCECTOMY  01/25/2011   Procedure: LUMBAR LAMINECTOMY/DECOMPRESSION MICRODISCECTOMY;  Surgeon: Alvy Beal;  Location: [facility] OR;  Service: Orthopedics;  Laterality: Left;  LEFT L5-S1 MICRODISCECTOMY, Central Decompression Lumbar five-sacral one  . OOPHORECTOMY Left 2011  . OOPHORECTOMY Right 10/2012  . SPINAL CORD STIMULATOR IMPLANT  2009   Removal-01/2012  . SPINAL CORD STIMULATOR REMOVAL  01/17/2012   Procedure: LUMBAR SPINAL CORD STIMULATOR REMOVAL;  Surgeon: Venita Lick, MD;  Location: [facility] OR;  Service: Orthopedics;  Laterality: N/A;  Spinal Cord Battery Removal  . TUBAL LIGATION  1988   Family History  Problem Relation Age of Onset  . Depression Mother   .  Ovarian cancer Mother   . Hypertension Mother   . Hypercholesterolemia Mother   . Obesity Mother   . Hypertension Sister   . Obesity Sister   . Healthy Sister   . Healthy Daughter   . Healthy Daughter    Social History   Socioeconomic History  . Marital status: Divorced    Spouse name: None  . Number of children: 2  . Years of education: None  . Highest education level: 12th grade  Social Needs  . Financial resource strain: Not hard at all  . Food insecurity - worry: Never true  . Food insecurity - inability: Never true  . Transportation needs - medical: No  . Transportation needs - non-medical: No  Occupational History    Employer: UNEMPLOYED    Employer: DISABLED  Tobacco Use  . Smoking status: Former Smoker    Packs/day: 1.00    Years: 30.00    Pack years: 30.00    Types: Cigarettes    Start date: 09/10/1986    Last attempt to quit: 2018    Years since quitting: 1.1  . Smokeless tobacco: Never Used  . Tobacco comment: smoking cessation materials not required  Substance and Sexual Activity  . Alcohol use: No    Alcohol/week: 0.0 oz  . Drug use: No  . Sexual activity: Yes    Partners: Male    Birth control/protection: Post-menopausal  Other Topics Concern  . None  Social History Narrative  . None    Tobacco Counseling Counseling given: No Comment: smoking cessation materials not required   Clinical Intake:  Pre-visit preparation completed: Yes  Pain : No/denies pain   BMI - recorded: 25.36 Nutritional Status: BMI of 19-24  Normal Nutritional Risks: None Diabetes: No  How often do you need to have someone help you when you read instructions, pamphlets, or other written materials from your doctor or pharmacy?: 1 - Never  Interpreter Needed?: No  Information entered by :: AEversole, LPN   Activities of Daily Living In your present state of health, do you have any difficulty performing the following activities: 05/17/2017 11/28/2016  Hearing? N N    Comment denies hearing aids -  Vision? N N  Comment wears reading glasses -  Difficulty concentrating or making decisions? N N  Walking or climbing stairs? Y N  Comment back pain -  Dressing or bathing? N N  Doing errands, shopping? - Engineering geologist and eating ? N -  Comment denies dentures -  Using the Toilet? N -  In the past six months, have you accidently leaked urine? N -  Do you have problems with loss of bowel control? N -  Managing your Medications? N -  Managing your Finances? N -  Housekeeping or managing your Housekeeping? N -  Some recent data might be hidden     Immunizations and Health Maintenance Immunization History  Administered Date(s) Administered  .  Influenza Split 11/29/2011  . Influenza, Seasonal, Injecte, Preservative Fre 01/10/2011  . Influenza,inj,Quad PF,6+ Mos 12/10/2012, 02/17/2014, 01/18/2015, 01/17/2016, 11/28/2016  . Influenza-Unspecified 02/17/2014  . Pneumococcal Conjugate-13 07/26/2015  . Pneumococcal Polysaccharide-23 01/10/2010  . Tdap 10/10/2011   There are no preventive care reminders to display for this patient.  Patient Care Team: Alba Cory, MD as PCP - General  Indicate any recent Medical Services you may have received from other than [facility] providers in the past year (date may be approximate).     Assessment:   This is a routine wellness examination for Clarie.  Hearing/Vision screen Vision Screening Comments: Not established with a provider. Provided with a list of providers to establish care.  Dietary issues and exercise activities discussed: Current Exercise Habits: Home exercise routine, Type of exercise: walking, Time (Minutes): 40, Frequency (Times/Week): 4, Weekly Exercise (Minutes/Week): 160, Intensity: Mild  Goals    . DIET - INCREASE WATER INTAKE     Recommend to drink at least 6-8 8oz glasses of water per day.      Depression Screen PHQ 2/9 Scores 05/17/2017 05/17/2017 11/28/2016 07/06/2016 04/26/2016  01/17/2016 07/26/2015  PHQ - 2 Score 0 0 0 0 0 0 0  PHQ- 9 Score 0 - - - - - -    Fall Risk Fall Risk  05/17/2017 11/28/2016 07/06/2016 04/26/2016 01/17/2016  Falls in the past year? No No No No No  Number falls in past yr: - - - - -  Injury with Fall? - - - - -    Is the patient's home free of loose throw rugs in walkways, pet beds, electrical cords, etc?   Yes Does the patient have any grab bars in the bathroom? Yes  Does the patient use a shower chair when bathing? No Does the patient have any stairs in or around the home? No If so, are there any handrails?  N/A Does the patient have adequate lighting?  Yes Does the patient use a cane, walker or w/c? No Does the patient use of an elevated toilet seat? No  Timed Get Up and Go Performed: Yes. Pt ambulated 10 feet within 5 sec. Gait stead-fast and without the use of an assistive device. No intervention required at this time. Fall risk prevention has been discussed.  Pt declined my offer to send Community Resource Referral to Care Guide for  shower chair or an elevated toilet seat.   Cognitive Function:     6CIT Screen 05/17/2017  What Year? 0 points  What month? 0 points  What time? 0 points  Count back from 20 0 points  Months in reverse 0 points  Repeat phrase 0 points  Total Score 0    Screening Tests Health Maintenance  Topic Date Due  . MAMMOGRAM  06/05/2017  . TETANUS/TDAP  10/09/2021  . COLONOSCOPY  04/21/2024  . INFLUENZA VACCINE  Completed  . Hepatitis C Screening  Completed  . HIV Screening  Discontinued    Qualifies for Shingles Vaccine? Yes. Due for Zostavax or Shingrix vaccine. Education has been provided regarding the importance of this vaccine. Pt has been advised to call her insurance company to determine her out of pocket expense. Advised she may also receive this vaccine at her local pharmacy or Health Dept. Verbalized acceptance and understanding.  Cancer Screenings: Lung: Low Dose CT Chest recommended if  Age 42-80 years, 30 pack-year currently smoking OR have quit w/in 15years. Patient does not qualify due to age. Breast: Up to date on Mammogram?  Yes. Completed 06/05/16. Repeat every year. Ordered repeat mammogram today. Advised to schedule mammogram on or after 06/05/17. Verbalized acceptance and understanding. Patient has been provided with contact information to schedule her own appointment. Up to date of Bone Density/Dexa? Yes. Completed 06/05/16. Osteoporotic screenings no longer required. Colorectal: Completed colonoscopy 04/22/14. Repeat every 10 years  Additional Screenings: Hepatitis B/HIV/Syphillis: Does not qualify Hepatitis C Screening: Completed 07/25/12    Plan:  I have personally reviewed and addressed the Medicare Annual Wellness questionnaire and have noted the following in the patient's chart:  A. Medical and social history B. Use of alcohol, tobacco or illicit drugs  C. Current medications and supplements D. Functional ability and status E.  Nutritional status F.  Physical activity G. Advance directives H. List of other physicians I.  Hospitalizations, surgeries, and ER visits in previous 12 months J.  Vitals K. Screenings such as hearing and vision if needed, cognitive and depression L. Referrals and appointments - none  In addition, I have reviewed and discussed with patient certain preventive protocols, quality metrics, and best practice recommendations. A written personalized care plan for preventive services as well as general preventive health recommendations were provided to patient.  See attached scanned questionnaire for additional information.   Signed,  Deon PillingAmmie Louiza Moor, LPN Nurse Health Advisor   I have reviewed this encounter including the documentation in this note and/or discussed this patient with the provider, Deon PillingAmmie Elric Tirado, LPN. I am certifying that I agree with the content of this note as supervising physician.  Alba CoryKrichna Sowles, MD [facility] 05/17/2017, 12:22 PM

## 2017-05-17 NOTE — Progress Notes (Signed)
Name: Tara [name]   MRN: [id number]    DOB: 1965/08/31   Date:05/17/2017       Progress Note  Subjective  Chief Complaint  Chief Complaint  Patient presents with  . Follow-up    HPI   GERD/Barrett's esophagus: taking Nexium and Ranitidine twice daily, doing better but still has symptoms of heartburn on her throat, and occasional regurgitation. She could not tolerate aspirin because it caused worsening of symptoms.  She is due for repeat EGD in 2019, and it has already been scheduled for March 2019  Major Depression: she states she is stable.  She bought a cottage in [place] this year, and going to the mountains on a regular basis and hanging out with her grandson gives her joy. She states she does not want to wean off medication, explained to her there is no need to stop Effexor. Mother is doing better, finished radiation and chemotherapy for ovarian cancer. She states the weather is not helping her mood, also states pain on her back is a little worse and is bothering her  Insomnia: she has been off Ambien and states able to sleep on 50 mg of Seroquel, taking Benadryl 100 mg at night, advised to stop Benadryl, needs to stop watching TV in bed, use bed only for sleep and sex. Advised melatonin, go back on lower dose of Seroquel but take earlier in the evening.   Dyslipidemia: on diet only, last HDL was back to normal but LDL was up again at 151, discussed life style modification   Reflex Sympathetic Dystrophy: she has been on disability since 2010, for RSD right arm and chronic back pain. She still has visits with pain clinic and gets epidurals injection, off pain medications. Full Medicare coverage since July 2017. however she would like a refill of topical medication ( she will send me the formulation)   COPD mild :last visit spirometry showed mild obstruction, she was having daily cough and occasional SOB, and decided to quit smoking . Last cigarette 12/2016  Occasionally has an  e-cigarette. She has been eating more and gained weight, but is feeling well. No longer has a cough, occasionally has SOB but resolves with Anoro.    Patient Active Problem List   Diagnosis Date Noted  . Purpura, nonthrombocytopenic (HCC) 05/17/2017  . Fatty liver 01/28/2015  . Barrett esophagus 09/10/2014  . Major depression, recurrent, chronic (HCC) 09/08/2014  . Insomnia, persistent 09/08/2014  . Arthralgia, sacroiliac 09/08/2014  . Current smoker 09/08/2014  . Degeneration of lumbar or lumbosacral intervertebral disc 09/08/2014  . Gastro-esophageal reflux disease without esophagitis 09/08/2014  . Perennial allergic rhinitis 09/08/2014  . Nerve root disorder 09/08/2014  . Reflex sympathetic dystrophy 09/08/2014  . Retained orthopedic hardware 01/15/2012  . Chronic pain 01/25/2011    Past Surgical History:  Procedure Laterality Date  . ABDOMINAL HYSTERECTOMY  2005   partial  . CERVICAL DISC ARTHROPLASTY  2008  . KNEE ARTHROSCOPY Right 2008  . LUMBAR LAMINECTOMY/DECOMPRESSION MICRODISCECTOMY  01/25/2011   Procedure: LUMBAR LAMINECTOMY/DECOMPRESSION MICRODISCECTOMY;  Surgeon: Alvy Beal;  Location: [facility] OR;  Service: Orthopedics;  Laterality: Left;  LEFT L5-S1 MICRODISCECTOMY, Central Decompression Lumbar five-sacral one  . OOPHORECTOMY Left 2011  . OOPHORECTOMY Right 10/2012  . SPINAL CORD STIMULATOR IMPLANT  2009   Removal-01/2012  . SPINAL CORD STIMULATOR REMOVAL  01/17/2012   Procedure: LUMBAR SPINAL CORD STIMULATOR REMOVAL;  Surgeon: Venita Lick, MD;  Location: [facility] OR;  Service: Orthopedics;  Laterality: N/A;  Spinal Cord Battery  Removal  . TUBAL LIGATION  1988    Family History  Problem Relation Age of Onset  . Depression Mother   . Ovarian cancer Mother   . Hypertension Mother   . Hypercholesterolemia Mother   . Obesity Mother   . Hypertension Sister   . Obesity Sister   . Healthy Sister   . Healthy Daughter   . Healthy Daughter     Social History    Socioeconomic History  . Marital status: Divorced    Spouse name: Not on file  . Number of children: 2  . Years of education: Not on file  . Highest education level: 12th grade  Social Needs  . Financial resource strain: Not hard at all  . Food insecurity - worry: Never true  . Food insecurity - inability: Never true  . Transportation needs - medical: No  . Transportation needs - non-medical: No  Occupational History    Employer: UNEMPLOYED    Employer: DISABLED  Tobacco Use  . Smoking status: Former Smoker    Packs/day: 1.00    Years: 30.00    Pack years: 30.00    Types: Cigarettes    Start date: 09/10/1986    Last attempt to quit: 2018    Years since quitting: 1.1  . Smokeless tobacco: Never Used  . Tobacco comment: smoking cessation materials not required  Substance and Sexual Activity  . Alcohol use: No    Alcohol/week: 0.0 oz  . Drug use: No  . Sexual activity: Yes    Partners: Male    Birth control/protection: Post-menopausal  Other Topics Concern  . Not on file  Social History Narrative  . Not on file     Current Outpatient Medications:  .  acidophilus (RISAQUAD) CAPS capsule, Take by mouth daily., Disp: , Rfl:  .  diclofenac (FLECTOR) 1.3 % PTCH, Place 1 patch onto the skin 2 (two) times daily as needed. For pain, Disp: , Rfl:  .  diphenhydrAMINE (BENADRYL) 25 mg capsule, Take 25 mg by mouth every 6 (six) hours as needed., Disp: , Rfl:  .  esomeprazole (NEXIUM) 40 MG capsule, Take 1 capsule by mouth 2 (two) times daily., Disp: , Rfl:  .  methocarbamol (ROBAXIN) 500 MG tablet, Take 1 tablet by mouth at bedtime., Disp: , Rfl:  .  Pyridoxine HCl (VITAMIN B-6) 250 MG tablet, Take 250 mg by mouth daily., Disp: , Rfl:  .  QUEtiapine (SEROQUEL) 25 MG tablet, Take 1 tablet (25 mg total) by mouth at bedtime., Disp: 90 tablet, Rfl: 1 .  ranitidine (ZANTAC) 150 MG capsule, Take 1 capsule by mouth 2 (two) times daily., Disp: , Rfl: 3 .  RETIN-A MICRO PUMP 0.08 % GEL,  APP EXT AA QHS, Disp: , Rfl: 2 .  umeclidinium-vilanterol (ANORO ELLIPTA) 62.5-25 MCG/INH AEPB, Inhale 1 puff into the lungs daily., Disp: 60 each, Rfl: 2 .  venlafaxine XR (EFFEXOR-XR) 150 MG 24 hr capsule, Take 1 capsule (150 mg total) by mouth daily with breakfast., Disp: 90 capsule, Rfl: 1 .  Zoster Vaccine Adjuvanted [facility](SHINGRIX) injection, Inject 0.5 mLs into the muscle once for 1 dose., Disp: 0.5 mL, Rfl: 1  Allergies  Allergen Reactions  . Aspirin Other (See Comments)    Causes heartburn even the chewable kind  . Clonidine Derivatives Itching     ROS  Constitutional: Negative for fever positive for  weight change.  Respiratory: Negative   for cough ( doing well since she quit smoking) and shortness of breath  is better with Anoro   Cardiovascular: Negative for chest pain or palpitations.  Gastrointestinal: Negative for abdominal pain, no bowel changes.  Musculoskeletal: Negative for gait problem or joint swelling.  Skin: Negative for rash.  Neurological: Negative for dizziness or headache.  No other specific complaints in a complete review of systems (except as listed in HPI above).  Objective  Vitals:   05/17/17 1113  BP: 106/66  Pulse: 70  Resp: 12  Temp: 98.3 F (36.8 C)  TempSrc: Oral  SpO2: 96%  Weight: 171 lb 11.2 oz (77.9 kg)  Height: 5\' 9"  (1.753 m)    Body mass index is 25.36 kg/m.  Physical Exam  Constitutional: Patient appears well-developed and well-nourished. Overweigth  No distress.  HEENT: head atraumatic, normocephalic, pupils equal and reactive to light,  neck supple, throat within normal limits Cardiovascular: Normal rate, regular rhythm and normal heart sounds.  No murmur heard. No BLE edema. Pulmonary/Chest: Effort normal and breath sounds normal. No respiratory distress. Abdominal: Soft.  There is no tenderness. Psychiatric: Patient has a normal mood and affect. behavior is normal. Judgment and thought content normal. Muscular Skeletal: pain  with palpation of lumbar spine and also with flexion and extension lumbar spine, negative straight leg, slightly weaker on right side with grip   PHQ2/9: Depression screen [facility] 2/9 05/17/2017 05/17/2017 11/28/2016 07/06/2016 04/26/2016  Decreased Interest 0 0 0 0 0  Down, Depressed, Hopeless 0 0 0 0 0  PHQ - 2 Score 0 0 0 0 0  Altered sleeping 0 - - - -  Tired, decreased energy 0 - - - -  Change in appetite 0 - - - -  Feeling bad or failure about yourself  0 - - - -  Trouble concentrating 0 - - - -  Moving slowly or fidgety/restless 0 - - - -  Suicidal thoughts 0 - - - -  PHQ-9 Score 0 - - - -  Difficult doing work/chores Not difficult at all - - - -     Fall Risk: Fall Risk  05/17/2017 11/28/2016 07/06/2016 04/26/2016 01/17/2016  Falls in the past year? No No No No No  Number falls in past yr: - - - - -  Injury with Fall? - - - - -      Assessment & Plan  1. Major depression, recurrent, chronic (HCC)  Not in remission, but does not want to change medication  - venlafaxine XR (EFFEXOR-XR) 150 MG 24 hr capsule; Take 1 capsule (150 mg total) by mouth daily with breakfast.  Dispense: 90 capsule; Refill: 1  2. Purpura, nonthrombocytopenic (HCC)  Improved, no spots today   3. Reflex sympathetic dystrophy  Still has mild weakness and pain/burning on right arm and hand  4. Barrett's esophagus without dysplasia  Seeing Dr. Marva Panda, going to have EGD soon  5. Dyslipidemia  HDL is good, LDL high, unable to take aspirin, we will monitor.   6. COPD, mild (HCC)  Doing well better with Anoro, not using daily because of cost - umeclidinium-vilanterol (ANORO ELLIPTA) 62.5-25 MCG/INH AEPB; Inhale 1 puff into the lungs daily.  Dispense: 60 each; Refill: 2  7. Insomnia, persistent  - QUEtiapine (SEROQUEL) 25 MG tablet; Take 1 tablet (25 mg total) by mouth at bedtime.  Dispense: 90 tablet; Refill: 1  8. Need for shingles vaccine  - Zoster Vaccine Adjuvanted [facility]) injection; Inject  0.5 mLs into the muscle once for 1 dose.  Dispense: 0.5 mL; Refill: 1

## 2017-05-17 NOTE — Patient Instructions (Signed)

## 2017-05-29 ENCOUNTER — Ambulatory Visit: Payer: Medicare Other | Admitting: Family Medicine

## 2017-06-03 ENCOUNTER — Encounter: Payer: Self-pay | Admitting: *Deleted

## 2017-06-04 ENCOUNTER — Encounter: Payer: Self-pay | Admitting: *Deleted

## 2017-06-04 ENCOUNTER — Ambulatory Visit: Payer: Medicare Other | Admitting: Registered Nurse

## 2017-06-04 ENCOUNTER — Encounter
Admission: RE | Disposition: A | Discharge status: Home / Self Care | Payer: Self-pay | Source: Ambulatory Visit | Attending: Gastroenterology

## 2017-06-04 ENCOUNTER — Ambulatory Visit
Admission: RE | Admit: 2017-06-04 | Discharge: 2017-06-04 | Disposition: A | Discharge status: Home / Self Care | Payer: Medicare Other | Source: Ambulatory Visit | Attending: Gastroenterology | Admitting: Gastroenterology

## 2017-06-04 DIAGNOSIS — K319 Disease of stomach and duodenum, unspecified: Secondary | ICD-10-CM | POA: Insufficient documentation

## 2017-06-04 DIAGNOSIS — Z888 Allergy status to other drugs, medicaments and biological substances status: Secondary | ICD-10-CM | POA: Insufficient documentation

## 2017-06-04 DIAGNOSIS — Z886 Allergy status to analgesic agent status: Secondary | ICD-10-CM | POA: Insufficient documentation

## 2017-06-04 DIAGNOSIS — G9050 Complex regional pain syndrome I, unspecified: Secondary | ICD-10-CM | POA: Insufficient documentation

## 2017-06-04 DIAGNOSIS — K210 Gastro-esophageal reflux disease with esophagitis: Secondary | ICD-10-CM | POA: Insufficient documentation

## 2017-06-04 DIAGNOSIS — F329 Major depressive disorder, single episode, unspecified: Secondary | ICD-10-CM | POA: Insufficient documentation

## 2017-06-04 DIAGNOSIS — Z79899 Other long term (current) drug therapy: Secondary | ICD-10-CM | POA: Diagnosis not present

## 2017-06-04 DIAGNOSIS — J449 Chronic obstructive pulmonary disease, unspecified: Secondary | ICD-10-CM | POA: Insufficient documentation

## 2017-06-04 DIAGNOSIS — M479 Spondylosis, unspecified: Secondary | ICD-10-CM | POA: Insufficient documentation

## 2017-06-04 DIAGNOSIS — K2270 Barrett's esophagus without dysplasia: Secondary | ICD-10-CM | POA: Diagnosis present

## 2017-06-04 DIAGNOSIS — F419 Anxiety disorder, unspecified: Secondary | ICD-10-CM | POA: Insufficient documentation

## 2017-06-04 DIAGNOSIS — K219 Gastro-esophageal reflux disease without esophagitis: Secondary | ICD-10-CM | POA: Diagnosis present

## 2017-06-04 HISTORY — DX: Gastro-esophageal reflux disease without esophagitis: K21.9

## 2017-06-04 HISTORY — DX: Barrett's esophagus without dysplasia: K22.70

## 2017-06-04 HISTORY — DX: Personal history of other diseases of the musculoskeletal system and connective tissue: Z87.39

## 2017-06-04 HISTORY — DX: Complex regional pain syndrome I, unspecified: G90.50

## 2017-06-04 HISTORY — DX: Tobacco use: Z72.0

## 2017-06-04 HISTORY — PX: ESOPHAGOGASTRODUODENOSCOPY (EGD) WITH PROPOFOL: SHX5813

## 2017-06-04 HISTORY — DX: Anxiety disorder, unspecified: F41.9

## 2017-06-04 SURGERY — ESOPHAGOGASTRODUODENOSCOPY (EGD) WITH PROPOFOL
Anesthesia: General

## 2017-06-04 MED ORDER — LIDOCAINE HCL (PF) 1 % IJ SOLN
2.0000 mL | Freq: Once | INTRAMUSCULAR | Status: AC
  Administered 2017-06-04: 0.3 mL via INTRADERMAL

## 2017-06-04 MED ORDER — LIDOCAINE HCL (PF) 2 % IJ SOLN
INTRAMUSCULAR | Status: AC
  Filled 2017-06-04: qty 10

## 2017-06-04 MED ORDER — PROPOFOL 10 MG/ML IV BOLUS
INTRAVENOUS | Status: DC | PRN
  Administered 2017-06-04: 90 mg via INTRAVENOUS

## 2017-06-04 MED ORDER — PROPOFOL 500 MG/50ML IV EMUL
INTRAVENOUS | Status: DC | PRN
  Administered 2017-06-04: 200 ug/kg/min via INTRAVENOUS

## 2017-06-04 MED ORDER — SODIUM CHLORIDE 0.9 % IV SOLN
INTRAVENOUS | Status: DC
  Administered 2017-06-04: 1000 mL via INTRAVENOUS

## 2017-06-04 MED ORDER — GLYCOPYRROLATE 0.2 MG/ML IJ SOLN
INTRAMUSCULAR | Status: AC
  Filled 2017-06-04: qty 1

## 2017-06-04 MED ORDER — LIDOCAINE HCL (CARDIAC) 20 MG/ML IV SOLN
INTRAVENOUS | Status: DC | PRN
  Administered 2017-06-04: 100 mg via INTRAVENOUS

## 2017-06-04 MED ORDER — LIDOCAINE HCL (PF) 1 % IJ SOLN
INTRAMUSCULAR | Status: AC
  Administered 2017-06-04: 0.3 mL via INTRADERMAL
  Filled 2017-06-04: qty 2

## 2017-06-04 MED ORDER — SODIUM CHLORIDE 0.9 % IV SOLN: INTRAVENOUS | Status: DC

## 2017-06-04 MED ORDER — PROPOFOL 500 MG/50ML IV EMUL
INTRAVENOUS | Status: AC
  Filled 2017-06-04: qty 50

## 2017-06-04 NOTE — Anesthesia Preprocedure Evaluation (Signed)
Anesthesia Evaluation  Patient identified by MRN, date of birth, ID band Patient awake    Reviewed: Allergy & Precautions, H&P , NPO status , Patient's Chart, lab work & pertinent test results, reviewed documented beta blocker date and time   Airway Mallampati: II   Neck ROM: full    Dental  (+) Poor Dentition   Pulmonary neg pulmonary ROS, pneumonia, COPD,  COPD inhaler, Current Smoker,    Pulmonary exam normal        Cardiovascular negative cardio ROS Normal cardiovascular exam Rhythm:regular Rate:Normal     Neuro/Psych  Headaches, PSYCHIATRIC DISORDERS Anxiety Depression  Neuromuscular disease negative neurological ROS  negative psych ROS   GI/Hepatic negative GI ROS, Neg liver ROS, GERD  ,  Endo/Other  negative endocrine ROS  Renal/GU negative Renal ROS  negative genitourinary   Musculoskeletal   Abdominal   Peds  Hematology negative hematology ROS (+)   Anesthesia Other Findings Past Medical History: No date: Anxiety No date: Arthritis     Comment:  arthritis in back No date: Barrett's esophagus No date: COPD (chronic obstructive pulmonary disease) (HCC)     Comment:  uses spiriva 2007: CPRS 1 (complex regional pain syndrome I) of upper limb     Comment:  formerly called rsd No date: Depression     Comment:  being treated for depression No date: GERD (gastroesophageal reflux disease) No date: H/O degenerative disc disease     Comment:  chronic back pain No date: Headache(784.0)     Comment:  headaches due to pain No date: Neuromuscular disorder (HCC)     Comment:  rsd No date: Pneumonia     Comment:  2011 - hospitalized for 4 days No date: Pneumonia No date: Reflex sympathetic dystrophy No date: Tobacco abuse Past Surgical History: 2005: ABDOMINAL HYSTERECTOMY     Comment:  partial 2008: CERVICAL DISC ARTHROPLASTY 2008: KNEE ARTHROSCOPY; Right 01/25/2011: LUMBAR LAMINECTOMY/DECOMPRESSION  MICRODISCECTOMY     Comment:  Procedure: LUMBAR LAMINECTOMY/DECOMPRESSION               MICRODISCECTOMY;  Surgeon: Alvy Bealahari D Brooks;  Location: [facility]              OR;  Service: Orthopedics;  Laterality: Left;  LEFT L5-S1              MICRODISCECTOMY, Central Decompression Lumbar five-sacral              one 2011: OOPHORECTOMY; Left 10/2012: OOPHORECTOMY; Right 2009: SPINAL CORD STIMULATOR IMPLANT     Comment:  Removal-01/2012 01/17/2012: SPINAL CORD STIMULATOR REMOVAL     Comment:  Procedure: LUMBAR SPINAL CORD STIMULATOR REMOVAL;                Surgeon: Venita Lickahari Brooks, MD;  Location: [facility] OR;  Service:               Orthopedics;  Laterality: N/A;  Spinal Cord Battery               Removal 1988: TUBAL LIGATION BMI    Body Mass Index:  24.51 kg/m     Reproductive/Obstetrics negative OB ROS                             Anesthesia Physical Anesthesia Plan  ASA: III  Anesthesia Plan: General   Post-op Pain Management:    Induction:   PONV Risk Score and Plan:   Airway Management Planned:   Additional Equipment:  Intra-op Plan:   Post-operative Plan:   Informed Consent: I have reviewed the patients History and Physical, chart, labs and discussed the procedure including the risks, benefits and alternatives for the proposed anesthesia with the patient or authorized representative who has indicated his/her understanding and acceptance.   Dental Advisory Given  Plan Discussed with: CRNA  Anesthesia Plan Comments:         Anesthesia Quick Evaluation

## 2017-06-04 NOTE — Op Note (Signed)
[facility] Gastroenterology Patient Name: Tara [name] Procedure Date: 06/04/2017 12:12 PM MRN: [id number] Account #: [id number] Date of Birth: 09-25-1965 Admit Type: Outpatient Age: 52 Room: [facility] ENDO ROOM 3 Gender: Female Note Status: Finalized Procedure:            Upper GI endoscopy Indications:          Gastro-esophageal reflux disease, Failure to respond to                        medical treatment, Follow-up of Barrett's esophagus Providers:            Christena Deem, MD Referring MD:         Onnie Boer. Sowles, MD (Referring MD) Medicines:            Monitored Anesthesia Care Complications:        No immediate complications. Procedure:            Pre-Anesthesia Assessment:                       - ASA Grade Assessment: II - A patient with mild                        systemic disease.                       After obtaining informed consent, the endoscope was                        passed under direct vision. Throughout the procedure,                        the patient's blood pressure, pulse, and oxygen                        saturations were monitored continuously. The Endoscope                        was introduced through the mouth, and advanced to the                        third part of duodenum. The patient tolerated the                        procedure well. Findings:      There were esophageal mucosal changes consistent with short-segment       Barrett's esophagus present at the gastroesophageal junction. The       maximum longitudinal extent of these mucosal changes was 1 cm in length.       Mucosa was biopsied with a cold forceps for histology in a targeted       manner and in 4 quadrants at the gastroesophageal junction. One specimen       bottle was sent to pathology.      The exam of the esophagus was otherwise normal.      The scope was advanced into the stomach. There was a thick white       material in the stomach , possibly food or  medication residue seen in       the gastric body which obscured evaluation to a mild to moderate degree.       Much  of this was removed with rinsing.      Patchy mild inflammation characterized by congestion (edema) and       erythema was found in the prepyloric region of the stomach. Biopsies       were taken with a cold forceps for histology. Biopsies were taken with a       cold forceps for histology. Biopsies were taken with a cold forceps for       Helicobacter pylori testing.      The cardia and gastric fundus were normal on retroflexion otherwise.      The exam of the duodenum was otherwise normal. Impression:           - Esophageal mucosal changes consistent with                        short-segment Barrett's esophagus. Biopsied.                       - Gastritis. Biopsied. Recommendation:       - Continue present medications.                       - Telephone GI clinic for pathology results in 1 week.                       - Return to GI clinic in 1 month. Procedure Code(s):    --- Professional ---                       [phone], Esophagogastroduodenoscopy, flexible, transoral;                        with biopsy, single or multiple Diagnosis Code(s):    --- Professional ---                       K22.70, Barrett's esophagus without dysplasia                       K29.70, Gastritis, unspecified, without bleeding                       K21.9, Gastro-esophageal reflux disease without                        esophagitis CPT copyright 2016 American Medical Association. All rights reserved. The codes documented in this report are preliminary and upon coder review may  be revised to meet current compliance requirements. Christena DeemMartin U Jenin Birdsall, MD 06/04/2017 12:34:47 PM This report has been signed electronically. Number of Addenda: 0 Note Initiated On: 06/04/2017 12:12 PM      [facility]

## 2017-06-04 NOTE — Transfer of Care (Signed)
Immediate Anesthesia Transfer of Care Note  Patient: Vanita InglesCynthia M Mukai  Procedure(s) Performed: ESOPHAGOGASTRODUODENOSCOPY (EGD) WITH PROPOFOL (N/A )  Patient Location: PACU  Anesthesia Type:General  Level of Consciousness: awake  Airway & Oxygen Therapy: Patient Spontanous Breathing  Post-op Assessment: Report given to RN  Post vital signs: stable  Last Vitals:  Vitals Value Taken Time  BP 106/61 06/04/2017 12:33 PM  Temp 36.1 C 06/04/2017 12:33 PM  Pulse 69 06/04/2017 12:33 PM  Resp 12 06/04/2017 12:33 PM  SpO2 99 % 06/04/2017 12:33 PM    Last Pain:  Vitals:   06/04/17 1233  TempSrc: Tympanic  PainSc:          Complications: No apparent anesthesia complications

## 2017-06-04 NOTE — Anesthesia Post-op Follow-up Note (Signed)
Anesthesia QCDR form completed.        

## 2017-06-04 NOTE — H&P (Signed)
Outpatient short stay form Pre-procedure 06/04/2017 11:50 AM Christena Deem MD  Primary Physician: Dr. Alba Cory  Reason for visit: EGD  History of present illness: Patient is a 52 year old female presenting today as above.  She has a personal history of Barrett's esophagus.  She has had difficulty with refractive reflux lately.  I discussed her medication timing with her.  She has been taking her proton pump inhibitor about 2 or 3 hours before a meal.  I believe that she is likely losing effectivity due to this.  I recommended that she change her timing on her Nexium to about 30-45 minutes before breakfast and again 30 for 45 minutes before supper.  May continue the Zantac.  She takes no aspirin or blood thinning agent.  She takes no NSAIDs.    Current Facility-Administered Medications:  .  0.9 %  sodium chloride infusion, , Intravenous, Continuous, Christena Deem, MD, Last Rate: 20 mL/hr at 06/04/17 1143, 1,000 mL at 06/04/17 1143 .  0.9 %  sodium chloride infusion, , Intravenous, Continuous, Christena Deem, MD  Medications Prior to Admission  Medication Sig Dispense Refill Last Dose  . clonazePAM (KLONOPIN) 1 MG tablet Take 1 mg by mouth.     Marland Kitchen MAGNESIUM OXIDE PO Take 1,000 mg by mouth at bedtime.     . Omega 3 1200 MG CAPS Take 1 capsule by mouth 2 (two) times daily.     Marland Kitchen oxyCODONE-acetaminophen (PERCOCET) 10-325 MG tablet Take 1 tablet by mouth every 4 (four) hours as needed for pain.     Marland Kitchen sertraline (ZOLOFT) 100 MG tablet Take 100 mg by mouth daily.     Marland Kitchen zolpidem (AMBIEN CR) 12.5 MG CR tablet Take 12.5 mg by mouth at bedtime as needed for sleep.     Marland Kitchen acidophilus (RISAQUAD) CAPS capsule Take by mouth daily.   Not Taking  . diclofenac (FLECTOR) 1.3 % PTCH Place 1 patch onto the skin 2 (two) times daily as needed. For pain   Taking  . diphenhydrAMINE (BENADRYL) 25 mg capsule Take 25 mg by mouth every 6 (six) hours as needed.   Taking  . esomeprazole (NEXIUM) 40 MG  capsule Take 1 capsule by mouth 2 (two) times daily.   06/04/2017 at 0630  . methocarbamol (ROBAXIN) 500 MG tablet Take 1 tablet by mouth at bedtime.   Not Taking  . Pyridoxine HCl (VITAMIN B-6) 250 MG tablet Take 250 mg by mouth daily.   Not Taking  . QUEtiapine (SEROQUEL) 25 MG tablet Take 1 tablet (25 mg total) by mouth at bedtime. 90 tablet 1   . ranitidine (ZANTAC) 150 MG capsule Take 1 capsule by mouth 2 (two) times daily.  3 Taking  . RETIN-A MICRO PUMP 0.08 % GEL APP EXT AA QHS  2 Not Taking  . umeclidinium-vilanterol (ANORO ELLIPTA) 62.5-25 MCG/INH AEPB Inhale 1 puff into the lungs daily. 60 each 2   . venlafaxine XR (EFFEXOR-XR) 150 MG 24 hr capsule Take 1 capsule (150 mg total) by mouth daily with breakfast. 90 capsule 1      Allergies  Allergen Reactions  . Aspirin Other (See Comments)    Causes heartburn even the chewable kind  . Clonidine Derivatives Itching     Past Medical History:  Diagnosis Date  . Anxiety   . Arthritis    arthritis in back  . Barrett's esophagus   . COPD (chronic obstructive pulmonary disease) (HCC)    uses spiriva  . CPRS 1 (complex  regional pain syndrome I) of upper limb 2007   formerly called rsd  . Depression    being treated for depression  . GERD (gastroesophageal reflux disease)   . H/O degenerative disc disease    chronic back pain  . Headache(784.0)    headaches due to pain  . Neuromuscular disorder (HCC)    rsd  . Pneumonia    2011 - hospitalized for 4 days  . Pneumonia   . Reflex sympathetic dystrophy   . Tobacco abuse     Review of systems:      Physical Exam    Heart and lungs: Regular rate and rhythm without rub or gallop, lungs are bilaterally clear.    HEENT: Normocephalic atraumatic eyes are anicteric    Other:    Pertinant exam for procedure: Soft nontender nondistended bowel sounds positive normoactive.    Planned proceedures: EGD and indicated procedures. I have discussed the risks benefits and  complications of procedures to include not limited to bleeding, infection, perforation and the risk of sedation and the patient wishes to proceed.    Christena DeemMartin U Nuno Brubacher, MD Gastroenterology 06/04/2017  11:50 AM

## 2017-06-05 ENCOUNTER — Encounter: Payer: Self-pay | Admitting: Gastroenterology

## 2017-06-06 NOTE — Anesthesia Postprocedure Evaluation (Signed)
Anesthesia Post Note  Patient: Tara InglesCynthia M Ticer  Procedure(s) Performed: ESOPHAGOGASTRODUODENOSCOPY (EGD) WITH PROPOFOL (N/A )  Patient location during evaluation: PACU Anesthesia Type: General Level of consciousness: awake and alert Pain management: pain level controlled Vital Signs Assessment: post-procedure vital signs reviewed and stable Respiratory status: spontaneous breathing, nonlabored ventilation, respiratory function stable and patient connected to nasal cannula oxygen Cardiovascular status: blood pressure returned to baseline and stable Postop Assessment: no apparent nausea or vomiting Anesthetic complications: no     Last Vitals:  Vitals:   06/04/17 1252 06/04/17 1302  BP: 94/79 104/77  Pulse: 65 64  Resp: 15 16  Temp:    SpO2: 100% 100%    Last Pain:  Vitals:   06/04/17 1302  TempSrc:   PainSc: 0-No pain                 Yevette EdwardsJames G Adams

## 2017-06-07 LAB — SURGICAL PATHOLOGY

## 2017-07-24 ENCOUNTER — Encounter: Payer: Self-pay | Admitting: Family Medicine

## 2017-07-24 DIAGNOSIS — K319 Disease of stomach and duodenum, unspecified: Secondary | ICD-10-CM | POA: Insufficient documentation

## 2017-11-11 ENCOUNTER — Other Ambulatory Visit: Payer: Self-pay | Admitting: Family Medicine

## 2017-11-11 DIAGNOSIS — G4700 Insomnia, unspecified: Secondary | ICD-10-CM

## 2017-11-11 DIAGNOSIS — J449 Chronic obstructive pulmonary disease, unspecified: Secondary | ICD-10-CM

## 2017-11-11 DIAGNOSIS — F339 Major depressive disorder, recurrent, unspecified: Secondary | ICD-10-CM

## 2017-11-12 NOTE — Telephone Encounter (Signed)
She needs follow up

## 2017-11-12 NOTE — Telephone Encounter (Signed)
LVM to let pt know to call the office to schedule an appt

## 2017-12-30 NOTE — Telephone Encounter (Signed)
Patient calling and states that she has a schedule conflict on 01/07/18 (Another Dr Appt at 1pm) and thinks that it would not allow enough time between appointments to get to the office. Offered her the first available 02/11/18. Would like to know if enough medication could be sent to the pharmacy to last until her rescheduled appt? [id number] - [place], [place] - [address] AT [facility] [address] [facility]

## 2017-12-31 MED ORDER — QUETIAPINE FUMARATE 25 MG PO TABS: 25.0000 mg | ORAL_TABLET | Freq: Every day | ORAL | 0 refills | Status: DC

## 2017-12-31 MED ORDER — VENLAFAXINE HCL ER 150 MG PO CP24: 150.0000 mg | ORAL_CAPSULE | Freq: Every day | ORAL | 0 refills | Status: DC

## 2017-12-31 MED ORDER — ESOMEPRAZOLE MAGNESIUM 40 MG PO CPDR: 40.0000 mg | DELAYED_RELEASE_CAPSULE | Freq: Two times a day (BID) | ORAL | 0 refills | Status: DC

## 2017-12-31 MED ORDER — UMECLIDINIUM-VILANTEROL 62.5-25 MCG/INH IN AEPB: 1.0000 | INHALATION_SPRAY | Freq: Every day | RESPIRATORY_TRACT | 0 refills | Status: DC

## 2017-12-31 NOTE — Telephone Encounter (Signed)
Pt had to resch her appt for Dec. Is it possible for her to get enough medication to last until then? Please advise

## 2017-12-31 NOTE — Telephone Encounter (Signed)
Only medications that are not controlled

## 2017-12-31 NOTE — Addendum Note (Signed)
Addended by: Cynda Familia on: 12/31/2017 03:30 PM   Modules accepted: Orders

## 2017-12-31 NOTE — Telephone Encounter (Signed)
Patent notified and asked we send in the 30 days supply to [facility] in [place].

## 2018-01-07 ENCOUNTER — Ambulatory Visit: Payer: Medicare Other | Admitting: Family Medicine

## 2018-01-08 ENCOUNTER — Ambulatory Visit: Payer: Medicare Other | Admitting: Family Medicine

## 2018-02-04 ENCOUNTER — Other Ambulatory Visit: Payer: Self-pay | Admitting: Family Medicine

## 2018-02-04 DIAGNOSIS — G4700 Insomnia, unspecified: Secondary | ICD-10-CM

## 2018-02-11 ENCOUNTER — Ambulatory Visit: Payer: Medicare Other | Admitting: Family Medicine

## 2018-02-11 ENCOUNTER — Encounter: Payer: Self-pay | Admitting: Family Medicine

## 2018-02-11 VITALS — BP 108/60 | HR 90 | Temp 97.8°F | Resp 16 | Ht 69.0 in | Wt 165.8 lb

## 2018-02-11 DIAGNOSIS — Z23 Encounter for immunization: Secondary | ICD-10-CM | POA: Diagnosis not present

## 2018-02-11 DIAGNOSIS — K219 Gastro-esophageal reflux disease without esophagitis: Secondary | ICD-10-CM

## 2018-02-11 DIAGNOSIS — G4700 Insomnia, unspecified: Secondary | ICD-10-CM

## 2018-02-11 DIAGNOSIS — F339 Major depressive disorder, recurrent, unspecified: Secondary | ICD-10-CM

## 2018-02-11 DIAGNOSIS — E785 Hyperlipidemia, unspecified: Secondary | ICD-10-CM

## 2018-02-11 DIAGNOSIS — Z79899 Other long term (current) drug therapy: Secondary | ICD-10-CM

## 2018-02-11 DIAGNOSIS — J449 Chronic obstructive pulmonary disease, unspecified: Secondary | ICD-10-CM

## 2018-02-11 LAB — LIPID PANEL
CHOLESTEROL: 207 mg/dL — AB (ref <200)
HDL: 48 mg/dL — ABNORMAL LOW (ref >50)
LDL CHOLESTEROL (CALC): 132 mg/dL — AB
Non-HDL Cholesterol (Calc): 159 mg/dL (calc) — ABNORMAL HIGH (ref <130)
TRIGLYCERIDES: 155 mg/dL — AB (ref <150)
Total CHOL/HDL Ratio: 4.3 (calc) (ref <5.0)

## 2018-02-11 LAB — COMPLETE METABOLIC PANEL WITH GFR
AG RATIO: 1.3 (calc) (ref 1.0–2.5)
ALKALINE PHOSPHATASE (APISO): 91 U/L (ref 33–130)
ALT: 21 U/L (ref 6–29)
AST: 20 U/L (ref 10–35)
Albumin: 4.1 g/dL (ref 3.6–5.1)
BILIRUBIN TOTAL: 0.4 mg/dL (ref 0.2–1.2)
BUN: 19 mg/dL (ref 7–25)
CHLORIDE: 102 mmol/L (ref 98–110)
CO2: 28 mmol/L (ref 20–32)
Calcium: 9.4 mg/dL (ref 8.6–10.4)
Creat: 0.94 mg/dL (ref 0.50–1.05)
GFR, Est African American: 80 mL/min/{1.73_m2} (ref >=60)
GFR, Est Non African American: 69 mL/min/{1.73_m2} (ref >=60)
GLUCOSE: 93 mg/dL (ref 65–99)
Globulin: 3.2 g/dL (calc) (ref 1.9–3.7)
POTASSIUM: 4.8 mmol/L (ref 3.5–5.3)
Sodium: 138 mmol/L (ref 135–146)
Total Protein: 7.3 g/dL (ref 6.1–8.1)

## 2018-02-11 LAB — CBC WITH DIFFERENTIAL/PLATELET
BASOS ABS: 87 {cells}/uL (ref 0–200)
Basophils Relative: 1.4 %
EOS ABS: 775 {cells}/uL — AB (ref 15–500)
Eosinophils Relative: 12.5 %
HCT: 39.4 % (ref 35.0–45.0)
Hemoglobin: 13.4 g/dL (ref 11.7–15.5)
Lymphs Abs: 2151 cells/uL (ref 850–3900)
MCH: 31.5 pg (ref 27.0–33.0)
MCHC: 34 g/dL (ref 32.0–36.0)
MCV: 92.7 fL (ref 80.0–100.0)
MONOS PCT: 10.6 %
MPV: 10.6 fL (ref 7.5–12.5)
NEUTROS PCT: 40.8 %
Neutro Abs: 2530 cells/uL (ref 1500–7800)
PLATELETS: 316 10*3/uL (ref 140–400)
RBC: 4.25 10*6/uL (ref 3.80–5.10)
RDW: 12.3 % (ref 11.0–15.0)
TOTAL LYMPHOCYTE: 34.7 %
WBC: 6.2 10*3/uL (ref 3.8–10.8)
WBCMIX: 657 {cells}/uL (ref 200–950)

## 2018-02-11 MED ORDER — UMECLIDINIUM-VILANTEROL 62.5-25 MCG/INH IN AEPB: 1.0000 | INHALATION_SPRAY | Freq: Every day | RESPIRATORY_TRACT | 2 refills | Status: DC

## 2018-02-11 MED ORDER — QUETIAPINE FUMARATE 25 MG PO TABS: 25.0000 mg | ORAL_TABLET | Freq: Every day | ORAL | 3 refills | Status: DC

## 2018-02-11 MED ORDER — FAMOTIDINE 20 MG PO TABS: 20.0000 mg | ORAL_TABLET | Freq: Two times a day (BID) | ORAL | 0 refills | Status: DC

## 2018-02-11 MED ORDER — VENLAFAXINE HCL ER 150 MG PO CP24: 150.0000 mg | ORAL_CAPSULE | Freq: Every day | ORAL | 3 refills | Status: DC

## 2018-02-11 NOTE — Progress Notes (Signed)
Name: Tara [name]   MRN: [id number]    DOB: [birth year]   Date:02/11/2018       Progress Note  Subjective  Chief Complaint  Chief Complaint  Patient presents with  . Medication Refill  . Depression  . Gastroesophageal Reflux    Would like to discuss Ranitidine-since it was pulled off the shelf  . Insomnia  . Dyslipidemia  . Reflex Sympathetic Dystrophy  . COPD    HPI  GERD/Barrett's esophagus: taking Nexium and Pepcid otc  twice daily, she states since she had to come off Ranitidine because of recall she has episodes of nausea.  She has occasional  sore  throat, and occasional regurgitation. She could not tolerate aspirin because it caused worsening of symptoms.She had repeat EGD and still has Barrett's   Major Depression: shestates she is stable.She bought a cottage in [place] in 2019, and going to [employer] on a regular basis and hanging out with her grandson gives her joy.Mother is doing better, finished radiation and chemotherapy for ovarian cancer and is still doing well.   Insomnia: she states sleeping well most of the time with Seroquel 25 mg qhs states even better than Ambien   Dyslipidemia: on diet only, last HDL was back to normal but LDL was up again at 151, discussed life style modification   Reflex Sympathetic Dystrophy: she has been on disability since 2010, for RSD right arm and chronic back pain. She still has visits with pain clinic and gets epidurals injection, off pain medications for over year . Full Medicare coverage since July 2017. She uses topical medication to control symptoms   COPD mild : previous spirometry showed mild obstruction, she was having daily cough and occasional SOB, and decided to quit smoking 12/2016, she is feeling better, but still vapes. She has occasional cough and uses Anoro but not daily. She would like to hold off to repeat spirometry on her next visit      Patient Active Problem List   Diagnosis Date Noted  .  Gastropathy 07/24/2017  . Purpura, nonthrombocytopenic (HCC) 05/17/2017  . Fatty liver 01/28/2015  . Barrett esophagus 09/10/2014  . Major depression, recurrent, chronic (HCC) 09/08/2014  . Insomnia, persistent 09/08/2014  . Arthralgia, sacroiliac 09/08/2014  . Degeneration of lumbar or lumbosacral intervertebral disc 09/08/2014  . Gastro-esophageal reflux disease without esophagitis 09/08/2014  . Perennial allergic rhinitis 09/08/2014  . Nerve root disorder 09/08/2014  . Reflex sympathetic dystrophy 09/08/2014  . Retained orthopedic hardware 01/15/2012  . Chronic pain 01/25/2011    Past Surgical History:  Procedure Laterality Date  . ABDOMINAL HYSTERECTOMY  2005   partial  . CERVICAL DISC ARTHROPLASTY  2008  . ESOPHAGOGASTRODUODENOSCOPY (EGD) WITH PROPOFOL N/A 06/04/2017   Procedure: ESOPHAGOGASTRODUODENOSCOPY (EGD) WITH PROPOFOL;  Surgeon: Christena Deem, MD;  Location: [facility] ENDOSCOPY;  Service: Endoscopy;  Laterality: N/A;  . KNEE ARTHROSCOPY Right 2008  . LUMBAR LAMINECTOMY/DECOMPRESSION MICRODISCECTOMY  01/25/2011   Procedure: LUMBAR LAMINECTOMY/DECOMPRESSION MICRODISCECTOMY;  Surgeon: Alvy Beal;  Location: [facility] OR;  Service: Orthopedics;  Laterality: Left;  LEFT L5-S1 MICRODISCECTOMY, Central Decompression Lumbar five-sacral one  . OOPHORECTOMY Left 2011  . OOPHORECTOMY Right 10/2012  . SPINAL CORD STIMULATOR IMPLANT  2009   Removal-01/2012  . SPINAL CORD STIMULATOR REMOVAL  01/17/2012   Procedure: LUMBAR SPINAL CORD STIMULATOR REMOVAL;  Surgeon: Venita Lick, MD;  Location: [facility] OR;  Service: Orthopedics;  Laterality: N/A;  Spinal Cord Battery Removal  . TUBAL LIGATION  1988    Family  History  Problem Relation Age of Onset  . Depression Mother   . Ovarian cancer Mother   . Hypertension Mother   . Hypercholesterolemia Mother   . Obesity Mother   . Hypertension Sister   . Obesity Sister   . Healthy Sister   . Healthy Daughter   . Healthy Daughter     Social  History   Socioeconomic History  . Marital status: Divorced    Spouse name: Not on file  . Number of children: 2  . Years of education: Not on file  . Highest education level: 12th grade  Occupational History    Employer: UNEMPLOYED    Employer: DISABLED  Social Needs  . Financial resource strain: Not hard at all  . Food insecurity:    Worry: Never true    Inability: Never true  . Transportation needs:    Medical: No    Non-medical: No  Tobacco Use  . Smoking status: Former Smoker    Packs/day: 1.00    Years: 30.00    Pack years: 30.00    Types: Cigarettes    Start date: 09/10/1986    Last attempt to quit: 2018    Years since quitting: 1.9  . Smokeless tobacco: Never Used  Substance and Sexual Activity  . Alcohol use: No    Alcohol/week: 0.0 standard drinks  . Drug use: No  . Sexual activity: Yes    Partners: Male    Birth control/protection: Post-menopausal  Lifestyle  . Physical activity:    Days per week: 4 days    Minutes per session: 40 min  . Stress: Not at all  Relationships  . Social connections:    Talks on phone: Patient refused    Gets together: Patient refused    Attends religious service: Patient refused    Active member of club or organization: Patient refused    Attends meetings of clubs or organizations: Patient refused    Relationship status: Divorced  . Intimate partner violence:    Fear of current or ex partner: No    Emotionally abused: No    Physically abused: No    Forced sexual activity: No  Other Topics Concern  . Not on file  Social History Narrative  . Not on file     Current Outpatient Medications:  .  b complex vitamins capsule, Take 1 capsule by mouth daily., Disp: , Rfl:  .  diclofenac (FLECTOR) 1.3 % PTCH, Place 1 patch onto the skin 2 (two) times daily as needed. For pain, Disp: , Rfl:  .  diphenhydrAMINE (BENADRYL) 25 mg capsule, Take 25 mg by mouth every 6 (six) hours as needed., Disp: , Rfl:  .  esomeprazole (NEXIUM) 40  MG capsule, Take 1 capsule (40 mg total) by mouth 2 (two) times daily., Disp: 30 capsule, Rfl: 0 .  MAGNESIUM OXIDE PO, Take 1,000 mg by mouth at bedtime., Disp: , Rfl:  .  methocarbamol (ROBAXIN) 500 MG tablet, Take 1 tablet by mouth at bedtime., Disp: , Rfl:  .  QUEtiapine (SEROQUEL) 25 MG tablet, Take 1 tablet (25 mg total) by mouth at bedtime., Disp: 90 tablet, Rfl: 3 .  umeclidinium-vilanterol (ANORO ELLIPTA) 62.5-25 MCG/INH AEPB, Inhale 1 puff into the lungs daily., Disp: 60 each, Rfl: 2 .  venlafaxine XR (EFFEXOR-XR) 150 MG 24 hr capsule, Take 1 capsule (150 mg total) by mouth daily with breakfast., Disp: 90 capsule, Rfl: 3 .  acidophilus (RISAQUAD) CAPS capsule, Take by mouth daily., Disp: , Rfl:  .  famotidine (PEPCID) 20 MG tablet, Take 1 tablet (20 mg total) by mouth 2 (two) times daily., Disp: 60 tablet, Rfl: 0 .  Omega 3 1200 MG CAPS, Take 1 capsule by mouth 2 (two) times daily., Disp: , Rfl:  .  Pyridoxine HCl (VITAMIN B-6) 250 MG tablet, Take 250 mg by mouth daily., Disp: , Rfl:   Allergies  Allergen Reactions  . Aspirin Other (See Comments)    Causes heartburn even the chewable kind  . Clonidine Derivatives Itching    I personally reviewed active problem list, medication list, allergies, family history, social history with the patient/caregiver today.   ROS  Constitutional: Negative for fever or weight change.  Respiratory: Positive  for intermittent cough and shortness of breath.   Cardiovascular: Negative for chest pain or palpitations.  Gastrointestinal: Negative for abdominal pain, no bowel changes.  Musculoskeletal: Positive  For intermittent  gait problem - when she has radiculitis, but no  joint swelling.  Skin: Negative for rash.  Neurological: Negative for dizziness or headache.  No other specific complaints in a complete review of systems (except as listed in HPI above).  Objective  Vitals:   02/11/18 0932  BP: 108/60  Pulse: 90  Resp: 16  Temp: 97.8  F (36.6 C)  TempSrc: Oral  SpO2: 97%  Weight: 165 lb 12.8 oz (75.2 kg)  Height: 5\' 9"  (1.753 m)    Body mass index is 24.48 kg/m.  Physical Exam  Constitutional: Patient appears well-developed and well-nourished.  No distress.  HEENT: head atraumatic, normocephalic, pupils equal and reactive to light, neck supple, throat within normal limits Cardiovascular: Normal rate, regular rhythm and normal heart sounds.  No murmur heard. No BLE edema. Pulmonary/Chest: Effort normal and breath sounds normal. No respiratory distress. Abdominal: Soft.  There is no tenderness. Psychiatric: Patient has a normal mood and affect. behavior is normal. Judgment and thought content normal.  PHQ2/9: Depression screen [facility] 2/9 02/11/2018 05/17/2017 05/17/2017 11/28/2016 07/06/2016  Decreased Interest 0 0 0 0 0  Down, Depressed, Hopeless 0 0 0 0 0  PHQ - 2 Score 0 0 0 0 0  Altered sleeping 2 0 - - -  Tired, decreased energy 1 0 - - -  Change in appetite 0 0 - - -  Feeling bad or failure about yourself  0 0 - - -  Trouble concentrating 0 0 - - -  Moving slowly or fidgety/restless 1 0 - - -  Suicidal thoughts 0 0 - - -  PHQ-9 Score 4 0 - - -  Difficult doing work/chores Somewhat difficult Not difficult at all - - -     Fall Risk: Fall Risk  02/11/2018 05/17/2017 11/28/2016 07/06/2016 04/26/2016  Falls in the past year? 0 No No No No  Number falls in past yr: 0 - - - -  Injury with Fall? 0 - - - -    Functional Status Survey: Is the patient deaf or have difficulty hearing?: No Does the patient have difficulty seeing, even when wearing glasses/contacts?: Yes Does the patient have difficulty concentrating, remembering, or making decisions?: No Does the patient have difficulty walking or climbing stairs?: No Does the patient have difficulty dressing or bathing?: No Does the patient have difficulty doing errands alone such as visiting a doctor's office or shopping?: No   Assessment & Plan  1. Need for  immunization against influenza  - Flu Vaccine QUAD 6+ mos PF IM (Fluarix Quad PF)  2-Insomnia  - QUEtiapine (SEROQUEL) 25  MG tablet; Take 1 tablet (25 mg total) by mouth at bedtime.  Dispense: 90 tablet; Refill: 3  3. Major depression, recurrent, chronic (HCC)  - venlafaxine XR (EFFEXOR-XR) 150 MG 24 hr capsule; Take 1 capsule (150 mg total) by mouth daily with breakfast.  Dispense: 90 capsule; Refill: 3  4. COPD, mild (HCC)  - umeclidinium-vilanterol (ANORO ELLIPTA) 62.5-25 MCG/INH AEPB; Inhale 1 puff into the lungs daily.  Dispense: 60 each; Refill: 2  5. Gastro-esophageal reflux disease without esophagitis  - famotidine (PEPCID) 20 MG tablet; Take 1 tablet (20 mg total) by mouth 2 (two) times daily.  Dispense: 60 tablet; Refill: 0  6. Long-term use of high-risk medication  - CBC with Differential/Platelet - COMPLETE METABOLIC PANEL WITH GFR  7. Dyslipidemia  - Lipid panel

## 2018-02-17 ENCOUNTER — Other Ambulatory Visit: Payer: Self-pay | Admitting: Family Medicine

## 2018-02-17 NOTE — Telephone Encounter (Signed)
Copied from CRM [phone]#[id number]. Topic: General - Other >> Feb 17, 2018  2:26 PM Ronney LionArrington, Shykila A wrote: Medication: esomeprazole (NEXIUM) 40 MG capsule  Has the patient contacted their pharmacy? Yes  Preferred Pharmacy (with phone number or street name): [id number] - [place], [place] - [address] [facility] [address]  [phone] (Phone) [phone] (Fax)    Agent: Please be advised that RX refills may take up to 3 business days. We ask that you follow-up with your pharmacy.

## 2018-02-18 MED ORDER — ESOMEPRAZOLE MAGNESIUM 40 MG PO CPDR: 40.0000 mg | DELAYED_RELEASE_CAPSULE | Freq: Two times a day (BID) | ORAL | 2 refills | Status: DC

## 2018-02-18 NOTE — Telephone Encounter (Signed)
Refill request for general medication: Nexium 40 mg  Last office visit: 02/11/2018  Last physical exam: 05/17/2017  Follow-ups on file. 08/12/2018

## 2018-03-09 ENCOUNTER — Other Ambulatory Visit: Payer: Self-pay | Admitting: Family Medicine

## 2018-03-09 DIAGNOSIS — G4700 Insomnia, unspecified: Secondary | ICD-10-CM

## 2018-03-25 ENCOUNTER — Ambulatory Visit: Payer: Medicare Other | Admitting: Family Medicine

## 2018-03-26 ENCOUNTER — Encounter: Payer: Self-pay | Admitting: Family Medicine

## 2018-03-26 ENCOUNTER — Ambulatory Visit: Payer: Medicare Other | Admitting: Family Medicine

## 2018-03-26 VITALS — BP 124/68 | HR 83 | Temp 99.0°F | Resp 16 | Ht 69.0 in | Wt 166.6 lb

## 2018-03-26 DIAGNOSIS — K2100 Gastro-esophageal reflux disease with esophagitis, without bleeding: Secondary | ICD-10-CM

## 2018-03-26 DIAGNOSIS — K2270 Barrett's esophagus without dysplasia: Secondary | ICD-10-CM | POA: Diagnosis not present

## 2018-03-26 NOTE — Progress Notes (Signed)
Name: Tara [name]   MRN: [id number]    DOB: 08/21/1965   Date:03/26/2018       Progress Note  Subjective  Chief Complaint  Chief Complaint  Patient presents with  . Gastroesophageal Reflux  . Referral    to GI    HPI  Pt presents with concern for GERD and history of Barrett's Esophagus - she was [facility]Skulskie [facility]. She was recently taken off of ranitidine due to recall, and has been taking pepcid 20-40mg  BID in combination with Nexium, and she is still having a lot of esophageal burning, some regurgitation, and nausea.  She denies chest pain, shortness of breath, abdominal pain, blood in stool, dark and tarry stools, or vomiting.  She would like referral to Dr. Tobi BastosAnna for evaluation.  Patient Active Problem List   Diagnosis Date Noted  . Gastropathy 07/24/2017  . Purpura, nonthrombocytopenic (HCC) 05/17/2017  . Fatty liver 01/28/2015  . Barrett esophagus 09/10/2014  . Major depression, recurrent, chronic (HCC) 09/08/2014  . Insomnia, persistent 09/08/2014  . Arthralgia, sacroiliac 09/08/2014  . Degeneration of lumbar or lumbosacral intervertebral disc 09/08/2014  . Gastro-esophageal reflux disease without esophagitis 09/08/2014  . Perennial allergic rhinitis 09/08/2014  . Nerve root disorder 09/08/2014  . Reflex sympathetic dystrophy 09/08/2014  . Retained orthopedic hardware 01/15/2012  . Chronic pain 01/25/2011    Social History   Tobacco Use  . Smoking status: Former Smoker    Packs/day: 1.00    Years: 30.00    Pack years: 30.00    Types: Cigarettes    Start date: 09/10/1986    Last attempt to quit: 2018    Years since quitting: 2.0  . Smokeless tobacco: Never Used  Substance Use Topics  . Alcohol use: No    Alcohol/week: 0.0 standard drinks     Current Outpatient Medications:  .  acidophilus (RISAQUAD) CAPS capsule, Take by mouth daily., Disp: , Rfl:  .  b complex vitamins capsule, Take 1 capsule by mouth daily., Disp: , Rfl:  .  diclofenac  (FLECTOR) 1.3 % PTCH, Place 1 patch onto the skin 2 (two) times daily as needed. For pain, Disp: , Rfl:  .  diphenhydrAMINE (BENADRYL) 25 mg capsule, Take 25 mg by mouth every 6 (six) hours as needed., Disp: , Rfl:  .  esomeprazole (NEXIUM) 40 MG capsule, Take 1 capsule (40 mg total) by mouth 2 (two) times daily., Disp: 30 capsule, Rfl: 2 .  famotidine (PEPCID) 20 MG tablet, Take 1 tablet (20 mg total) by mouth 2 (two) times daily., Disp: 60 tablet, Rfl: 0 .  MAGNESIUM OXIDE PO, Take 1,000 mg by mouth at bedtime., Disp: , Rfl:  .  methocarbamol (ROBAXIN) 500 MG tablet, Take 1 tablet by mouth at bedtime., Disp: , Rfl:  .  Omega 3 1200 MG CAPS, Take 1 capsule by mouth 2 (two) times daily., Disp: , Rfl:  .  Pyridoxine HCl (VITAMIN B-6) 250 MG tablet, Take 250 mg by mouth daily., Disp: , Rfl:  .  QUEtiapine (SEROQUEL) 25 MG tablet, TAKE 1 TABLET(25 MG) BY MOUTH AT BEDTIME, Disp: 90 tablet, Rfl: 1 .  umeclidinium-vilanterol (ANORO ELLIPTA) 62.5-25 MCG/INH AEPB, Inhale 1 puff into the lungs daily., Disp: 60 each, Rfl: 2 .  venlafaxine XR (EFFEXOR-XR) 150 MG 24 hr capsule, Take 1 capsule (150 mg total) by mouth daily with breakfast., Disp: 90 capsule, Rfl: 3  Allergies  Allergen Reactions  . Aspirin Other (See Comments)    Causes  heartburn even the chewable kind  . Clonidine Derivatives Itching    I personally reviewed active problem list, medication list, allergies with the patient/caregiver today.  ROS  Ten systems reviewed and is negative except as mentioned in HPI.  Objective  Vitals:   03/26/18 0954  BP: 124/68  Pulse: 83  Resp: 16  Temp: 99 F (37.2 C)  TempSrc: Oral  SpO2: 99%  Weight: 166 lb 9.6 oz (75.6 kg)  Height: 5\' 9"  (1.753 m)   Body mass index is 24.6 kg/m.  Nursing Note and Vital Signs reviewed.  Physical Exam  Constitutional: Patient appears well-developed and well-nourished. No distress.  HENT: Head: Normocephalic and atraumatic.  Cardiovascular: Normal  rate, regular rhythm and normal heart sounds.  No murmur heard. No BLE edema. Pulmonary/Chest: Effort normal and breath sounds normal. No respiratory distress. Abdominal: Soft. Bowel sounds are normal, no distension. There is no tenderness. No masses. Neurological: Pt is alert and oriented to person, place, and time. No cranial nerve deficit. Coordination, balance, strength, speech and gait are normal.  Skin: Skin is warm and dry. No rash noted. No erythema.  Psychiatric: Patient has a normal mood and affect. behavior is normal. Judgment and thought content normal.  No results found for this or any previous visit (from the past 72 hour(s)).  Assessment & Plan  1. Barrett's esophagus without dysplasia - Ambulatory referral to Gastroenterology  2. GERD with esophagitis - Ambulatory referral to Gastroenterology - Continue medications as prescribed - may increase famotidine to 40mg  if needed. May take tums PRN.    -Red flags and when to present for emergency care or RTC including fever >101.60F, chest pain, shortness of breath, new/worsening/un-resolving symptoms, reviewed with patient at time of visit. Follow up and care instructions discussed and provided in AVS.

## 2018-04-03 ENCOUNTER — Ambulatory Visit: Payer: Medicare Other | Admitting: Gastroenterology

## 2018-04-03 ENCOUNTER — Encounter: Payer: Self-pay | Admitting: Gastroenterology

## 2018-04-03 VITALS — BP 113/73 | HR 70 | Ht 69.0 in | Wt 170.2 lb

## 2018-04-03 DIAGNOSIS — K219 Gastro-esophageal reflux disease without esophagitis: Secondary | ICD-10-CM | POA: Diagnosis not present

## 2018-04-03 NOTE — Patient Instructions (Signed)

## 2018-04-03 NOTE — Progress Notes (Signed)
Wyline MoodKiran Yaretzy Olazabal MD, MRCP(U.K) [address]  [address]  [place], [place] [address]  Main: [phone]  Fax: [phone]   Gastroenterology Consultation  Referring Provider:     Doren CustardBoyce, Emily E, FNP Primary Care Physician:  Alba CorySowles, Krichna, MD Primary Gastroenterologist:  Dr. Wyline MoodKiran Kambrie Eddleman  Reason for Consultation:     GERD        HPI:   Tara InglesCynthia M [name] is a 53 y.o. y/o female referred for consultation & management  by Dr. Carlynn PurlSowles, Danna HeftyKrichna, MD.    Patient is here today to transfer care from Dr Marva PandaSkulskie for GERD.   Reflux:  Onset : Atleast 10 years Symptoms: Nausea since she stopped Ranitidine, pepcid doesn't work , heartburn. , acid taste in the mouth  Recent weight gain:  5 lbs  Medications: no  Narcotics or anticholinergics use : no  PPI /H2 blockers or Antacid  use and timing : Nexium 40 mg BID, before meals , eats after, Dinner time : 6 pm, bedtime around 10 pm - in between watching TV, Head end of the bed os elevated.   Prior EGD: 05/2017 : Note mentions short segment of Barrettes. Biopsies show no evidence of barrettes. Shows reflux esophagitis. Similarly in 2016 there was only focal goblet cell metaplasia. No metaplasia seen in 2014 Family history of esophageal cancer:no   Does not smoke, no alcohol.    Dexilant has been tried and stops working around 3 pm .   While on Zantac her symptoms were better controlled.    Past Medical History:  Diagnosis Date  . Anxiety   . Arthritis    arthritis in back  . Barrett's esophagus   . COPD (chronic obstructive pulmonary disease) (HCC)    uses spiriva  . CPRS 1 (complex regional pain syndrome I) of upper limb 2007   formerly called rsd  . Depression    being treated for depression  . GERD (gastroesophageal reflux disease)   . H/O degenerative disc disease    chronic back pain  . Headache(784.0)    headaches due to pain  . Neuromuscular disorder (HCC)    rsd  . Pneumonia    2011 - hospitalized for 4 days  .  Pneumonia   . Reflex sympathetic dystrophy   . Tobacco abuse     Past Surgical History:  Procedure Laterality Date  . ABDOMINAL HYSTERECTOMY  2005   partial  . CERVICAL DISC ARTHROPLASTY  2008  . ESOPHAGOGASTRODUODENOSCOPY (EGD) WITH PROPOFOL N/A 06/04/2017   Procedure: ESOPHAGOGASTRODUODENOSCOPY (EGD) WITH PROPOFOL;  Surgeon: Christena DeemSkulskie, Martin U, MD;  [facility];  Service: Endoscopy;  Laterality: N/A;  . KNEE ARTHROSCOPY Right 2008  . LUMBAR LAMINECTOMY/DECOMPRESSION MICRODISCECTOMY  01/25/2011   Procedure: LUMBAR LAMINECTOMY/DECOMPRESSION MICRODISCECTOMY;  Surgeon: Alvy Bealahari D Brooks;  Location: [facility] OR;  Service: Orthopedics;  Laterality: Left;  LEFT L5-S1 MICRODISCECTOMY, Central Decompression Lumbar five-sacral one  . OOPHORECTOMY Left 2011  . OOPHORECTOMY Right 10/2012  . SPINAL CORD STIMULATOR IMPLANT  2009   Removal-01/2012  . SPINAL CORD STIMULATOR REMOVAL  01/17/2012   Procedure: LUMBAR SPINAL CORD STIMULATOR REMOVAL;  Surgeon: Venita Lickahari Brooks, MD;  Location: [facility] OR;  Service: Orthopedics;  Laterality: N/A;  Spinal Cord Battery Removal  . TUBAL LIGATION  1988    Prior to Admission medications   Medication Sig Start Date End Date Taking? Authorizing Provider  b complex vitamins capsule Take 1 capsule by mouth daily.   Yes [provider]  diclofenac (FLECTOR) 1.3 % PTCH Place 1  patch onto the skin 2 (two) times daily as needed. For pain   Yes [provider]  esomeprazole (NEXIUM) 40 MG capsule Take 1 capsule (40 mg total) by mouth 2 (two) times daily. 02/18/18  Yes Sowles, Danna HeftyKrichna, MD  famotidine (PEPCID) 20 MG tablet Take 1 tablet (20 mg total) by mouth 2 (two) times daily. 02/11/18  Yes Sowles, Danna HeftyKrichna, MD  MAGNESIUM OXIDE PO Take 1,000 mg by mouth at bedtime.   Yes [provider]  methocarbamol (ROBAXIN) 500 MG tablet Take 1 tablet by mouth at bedtime.   Yes Venita LickBrooks, Dahari, MD  QUEtiapine (SEROQUEL) 25 MG tablet TAKE 1 TABLET(25 MG) BY MOUTH  AT BEDTIME 03/09/18  Yes Sowles, Danna HeftyKrichna, MD  umeclidinium-vilanterol (ANORO ELLIPTA) 62.5-25 MCG/INH AEPB Inhale 1 puff into the lungs daily. 02/11/18  Yes Sowles, Danna HeftyKrichna, MD  venlafaxine XR (EFFEXOR-XR) 150 MG 24 hr capsule Take 1 capsule (150 mg total) by mouth daily with breakfast. 02/11/18  Yes Sowles, Danna HeftyKrichna, MD  acidophilus (RISAQUAD) CAPS capsule Take by mouth daily.    [provider]  diphenhydrAMINE (BENADRYL) 25 mg capsule Take 25 mg by mouth every 6 (six) hours as needed.    [provider]  Omega 3 1200 MG CAPS Take 1 capsule by mouth 2 (two) times daily.    [provider]  Pyridoxine HCl (VITAMIN B-6) 250 MG tablet Take 250 mg by mouth daily.    [provider]    Family History  Problem Relation Age of Onset  . Depression Mother   . Ovarian cancer Mother   . Hypertension Mother   . Hypercholesterolemia Mother   . Obesity Mother   . Hypertension Sister   . Obesity Sister   . Healthy Sister   . Healthy Daughter   . Healthy Daughter      Social History   Tobacco Use  . Smoking status: Former Smoker    Packs/day: 1.00    Years: 30.00    Pack years: 30.00    Types: Cigarettes    Start date: 09/10/1986    Last attempt to quit: 2018    Years since quitting: 2.0  . Smokeless tobacco: Never Used  Substance Use Topics  . Alcohol use: No    Alcohol/week: 0.0 standard drinks  . Drug use: No    Allergies as of 04/03/2018 - Review Complete 04/03/2018  Allergen Reaction Noted  . Aspirin Other (See Comments) 01/17/2016  . Clonidine derivatives Itching 01/22/2011    Review of Systems:    All systems reviewed and negative except where noted in HPI.   Physical Exam:  BP 113/73   Pulse 70   Ht 5\' 9"  (1.753 m)   Wt 170 lb 3.2 oz (77.2 kg)   BMI 25.13 kg/m  No LMP recorded. Patient has had a hysterectomy. Psych:  Alert and cooperative. Normal mood and affect. General:   Alert,  Well-developed, well-nourished, pleasant and  cooperative in NAD Head:  Normocephalic and atraumatic. Eyes:  Sclera clear, no icterus.   Conjunctiva pink. Ears:  Normal auditory acuity. Nose:  No deformity, discharge, or lesions. Mouth:  No deformity or lesions,oropharynx pink & moist. Neck:  Supple; no masses or thyromegaly. Lungs:  Respirations even and unlabored.  Clear throughout to auscultation.   No wheezes, crackles, or rhonchi. No acute distress. Heart:  Regular rate and rhythm; no murmurs, clicks, rubs, or gallops. Abdomen:  Normal bowel sounds.  No bruits.  Soft, non-tender and non-distended without masses, hepatosplenomegaly or hernias noted.  No guarding or rebound tenderness.    Neurologic:  Alert and oriented x3;  grossly normal neurologically. Skin:  Intact without significant lesions or rashes. No jaundice. Lymph Nodes:  No significant cervical adenopathy. Psych:  Alert and cooperative. Normal mood and affect.  Imaging Studies: No results found.  Assessment and Plan:   Tara [name] is a 53 y.o. y/o female has been referred for transfer care from Dr Marva Panda for GERD and barrettes esophagus. She has had 3 EGD's over past 6 years, only on one occasion has a biopsy shown focal intestinal metaplasia and this was at the GE junction. This makes it less likely Barrettes esophagus, usually for this condition we should avoid taking biopsies at the GE junction as it leads to a false positive diagnosis.   At this point I suggest she continue PPI use and life style changes for GERD. I think we can put the matter to rest in term of her Barrettes diagnosis which am still skeptical about in 2022 when we can repeat an EGD, avoid biopsies at the GE junction to determine if she will require long term surveillance or not. She is keen on discussing surgical options as she has had reflux for many years and it is affecting her quality of life. She does have good response if not complete to PPI use.   I will refer her to GI surgeons at  [facility]  Follow up in 1 year  Dr Wyline Mood MD,MRCP(U.K)

## 2018-04-09 ENCOUNTER — Other Ambulatory Visit: Payer: Self-pay

## 2018-05-12 ENCOUNTER — Other Ambulatory Visit: Payer: Self-pay | Admitting: Family Medicine

## 2018-05-12 DIAGNOSIS — G4700 Insomnia, unspecified: Secondary | ICD-10-CM

## 2018-05-20 ENCOUNTER — Ambulatory Visit (INDEPENDENT_AMBULATORY_CARE_PROVIDER_SITE_OTHER): Payer: Medicare Other

## 2018-05-20 VITALS — BP 100/60 | HR 86 | Temp 98.2°F | Resp 16 | Ht 69.0 in | Wt 169.3 lb

## 2018-05-20 DIAGNOSIS — Z0000 Encounter for general adult medical examination without abnormal findings: Secondary | ICD-10-CM | POA: Diagnosis not present

## 2018-05-20 DIAGNOSIS — Z1231 Encounter for screening mammogram for malignant neoplasm of breast: Secondary | ICD-10-CM

## 2018-05-20 DIAGNOSIS — M8580 Other specified disorders of bone density and structure, unspecified site: Secondary | ICD-10-CM

## 2018-05-20 DIAGNOSIS — M810 Age-related osteoporosis without current pathological fracture: Secondary | ICD-10-CM

## 2018-05-20 NOTE — Patient Instructions (Signed)
[name] , Thank you for taking time to come for your Medicare Wellness Visit. I appreciate your ongoing commitment to your health goals. Please review the following plan we discussed and let me know if I can assist you in the future.   Screening recommendations/referrals: Colonoscopy: done 04/22/14. Repeat in 2026. Mammogram: done 06/05/16. Please call [phone] to schedule your mammogram and bone density screening.  Bone Density: done 06/05/16.  Recommended yearly ophthalmology/optometry visit for glaucoma screening and checkup Recommended yearly dental visit for hygiene and checkup  Vaccinations: Influenza vaccine: done 02/11/18 Pneumococcal vaccine: done 07/26/15 Tdap vaccine: done 10/10/11 due in 2023 Shingles vaccine: Shingrix discussed. Please contact your pharmacy for coverage information.   Advanced directives: Advance directive discussed with you today. Even though you declined this today please call our office should you change your mind and we can give you the proper paperwork for you to fill out.  Conditions/risks identified: Recommend healthy eating and physical activity for desired weight loss.   Next appointment: Please follow up in one year for your Medicare Annual Wellness visit.    Preventive Care 40-64 Years, Female Preventive care refers to lifestyle choices and visits with your health care provider that can promote health and wellness. What does preventive care include?  A yearly physical exam. This is also called an annual well check.  Dental exams once or twice a year.  Routine eye exams. Ask your health care provider how often you should have your eyes checked.  Personal lifestyle choices, including:  Daily care of your teeth and gums.  Regular physical activity.  Eating a healthy diet.  Avoiding tobacco and drug use.  Limiting alcohol use.  Practicing safe sex.  Taking low-dose aspirin daily starting at age 34.  Taking vitamin and mineral  supplements as recommended by your health care provider. What happens during an annual well check? The services and screenings done by your health care provider during your annual well check will depend on your age, overall health, lifestyle risk factors, and family history of disease. Counseling  Your health care provider may ask you questions about your:  Alcohol use.  Tobacco use.  Drug use.  Emotional well-being.  Home and relationship well-being.  Sexual activity.  Eating habits.  Work and work Statistician.  Method of birth control.  Menstrual cycle.  Pregnancy history. Screening  You may have the following tests or measurements:  Height, weight, and BMI.  Blood pressure.  Lipid and cholesterol levels. These may be checked every 5 years, or more frequently if you are over 44 years old.  Skin check.  Lung cancer screening. You may have this screening every year starting at age 44 if you have a 30-pack-year history of smoking and currently smoke or have quit within the past 15 years.  Fecal occult blood test (FOBT) of the stool. You may have this test every year starting at age 45.  Flexible sigmoidoscopy or colonoscopy. You may have a sigmoidoscopy every 5 years or a colonoscopy every 10 years starting at age 64.  Hepatitis C blood test.  Hepatitis B blood test.  Sexually transmitted disease (STD) testing.  Diabetes screening. This is done by checking your blood sugar (glucose) after you have not eaten for a while (fasting). You may have this done every 1-3 years.  Mammogram. This may be done every 1-2 years. Talk to your health care provider about when you should start having regular mammograms. This may depend on whether you have a family history  of breast cancer.  BRCA-related cancer screening. This may be done if you have a family history of breast, ovarian, tubal, or peritoneal cancers.  Pelvic exam and Pap test. This may be done every 3 years starting  at age 71. Starting at age 3, this may be done every 5 years if you have a Pap test in combination with an HPV test.  Bone density scan. This is done to screen for osteoporosis. You may have this scan if you are at high risk for osteoporosis. Discuss your test results, treatment options, and if necessary, the need for more tests with your health care provider. Vaccines  Your health care provider may recommend certain vaccines, such as:  Influenza vaccine. This is recommended every year.  Tetanus, diphtheria, and acellular pertussis (Tdap, Td) vaccine. You may need a Td booster every 10 years.  Zoster vaccine. You may need this after age 68.  Pneumococcal 13-valent conjugate (PCV13) vaccine. You may need this if you have certain conditions and were not previously vaccinated.  Pneumococcal polysaccharide (PPSV23) vaccine. You may need one or two doses if you smoke cigarettes or if you have certain conditions. Talk to your health care provider about which screenings and vaccines you need and how often you need them. This information is not intended to replace advice given to you by your health care provider. Make sure you discuss any questions you have with your health care provider. Document Released: 03/25/2015 Document Revised: 11/16/2015 Document Reviewed: 12/28/2014 Elsevier Interactive Patient Education  2017 [place] Prevention in the Home Falls can cause injuries. They can happen to people of all ages. There are many things you can do to make your home safe and to help prevent falls. What can I do on the outside of my home?  Regularly fix the edges of walkways and driveways and fix any cracks.  Remove anything that might make you trip as you walk through a door, such as a raised step or threshold.  Trim any bushes or trees on the path to your home.  Use bright outdoor lighting.  Clear any walking paths of anything that might make someone trip, such as rocks or  tools.  Regularly check to see if handrails are loose or broken. Make sure that both sides of any steps have handrails.  Any raised decks and porches should have guardrails on the edges.  Have any leaves, snow, or ice cleared regularly.  Use sand or salt on walking paths during winter.  Clean up any spills in your garage right away. This includes oil or grease spills. What can I do in the bathroom?  Use night lights.  Install grab bars by the toilet and in the tub and shower. Do not use towel bars as grab bars.  Use non-skid mats or decals in the tub or shower.  If you need to sit down in the shower, use a plastic, non-slip stool.  Keep the floor dry. Clean up any water that spills on the floor as soon as it happens.  Remove soap buildup in the tub or shower regularly.  Attach bath mats securely with double-sided non-slip rug tape.  Do not have throw rugs and other things on the floor that can make you trip. What can I do in the bedroom?  Use night lights.  Make sure that you have a light by your bed that is easy to reach.  Do not use any sheets or blankets that are too big  for your bed. They should not hang down onto the floor.  Have a firm chair that has side arms. You can use this for support while you get dressed.  Do not have throw rugs and other things on the floor that can make you trip. What can I do in the kitchen?  Clean up any spills right away.  Avoid walking on wet floors.  Keep items that you use a lot in easy-to-reach places.  If you need to reach something above you, use a strong step stool that has a grab bar.  Keep electrical cords out of the way.  Do not use floor polish or wax that makes floors slippery. If you must use wax, use non-skid floor wax.  Do not have throw rugs and other things on the floor that can make you trip. What can I do with my stairs?  Do not leave any items on the stairs.  Make sure that there are handrails on both  sides of the stairs and use them. Fix handrails that are broken or loose. Make sure that handrails are as long as the stairways.  Check any carpeting to make sure that it is firmly attached to the stairs. Fix any carpet that is loose or worn.  Avoid having throw rugs at the top or bottom of the stairs. If you do have throw rugs, attach them to the floor with carpet tape.  Make sure that you have a light switch at the top of the stairs and the bottom of the stairs. If you do not have them, ask someone to add them for you. What else can I do to help prevent falls?  Wear shoes that:  Do not have high heels.  Have rubber bottoms.  Are comfortable and fit you well.  Are closed at the toe. Do not wear sandals.  If you use a stepladder:  Make sure that it is fully opened. Do not climb a closed stepladder.  Make sure that both sides of the stepladder are locked into place.  Ask someone to hold it for you, if possible.  Clearly mark and make sure that you can see:  Any grab bars or handrails.  First and last steps.  Where the edge of each step is.  Use tools that help you move around (mobility aids) if they are needed. These include:  Canes.  Walkers.  Scooters.  Crutches.  Turn on the lights when you go into a dark area. Replace any light bulbs as soon as they burn out.  Set up your furniture so you have a clear path. Avoid moving your furniture around.  If any of your floors are uneven, fix them.  If there are any pets around you, be aware of where they are.  Review your medicines with your doctor. Some medicines can make you feel dizzy. This can increase your chance of falling. Ask your doctor what other things that you can do to help prevent falls. This information is not intended to replace advice given to you by your health care provider. Make sure you discuss any questions you have with your health care provider. Document Released: 12/23/2008 Document Revised:  08/04/2015 Document Reviewed: 04/02/2014 Elsevier Interactive Patient Education  2017 [employer].

## 2018-05-20 NOTE — Progress Notes (Signed)
Subjective:   Tara [name] is a 53 y.o. female who presents for Medicare Annual (Subsequent) preventive examination.  Review of Systems:   Cardiac Risk Factors include: none     Objective:     Vitals: BP 100/60 (BP Location: Left Arm, Patient Position: Sitting, Cuff Size: Normal)   Pulse 86   Temp 98.2 F (36.8 C) (Oral)   Resp 16   Ht 5\' 9"  (1.753 m)   Wt 169 lb 4.8 oz (76.8 kg)   SpO2 95%   BMI 25.00 kg/m   Body mass index is 25 kg/m.  Advanced Directives 05/20/2018 06/04/2017 05/17/2017 11/28/2016 07/06/2016 04/26/2016 01/17/2016  Does Patient Have a Medical Advance Directive? No No No No No No No  Would patient like information on creating a medical advance directive? No - Patient declined No - Patient declined Yes (MAU/Ambulatory/Procedural Areas - Information given) - - - -  Pre-existing out of facility DNR order (yellow form or pink MOST form) - - - - - - -    Tobacco Social History   Tobacco Use  Smoking Status Former Smoker  . Packs/day: 1.00  . Years: 30.00  . Pack years: 30.00  . Types: Cigarettes  . Start date: 09/10/1986  . Last attempt to quit: 2018  . Years since quitting: 2.1  Smokeless Tobacco Never Used     Counseling given: Not Answered   Clinical Intake:  Pre-visit preparation completed: Yes  Pain : No/denies pain     BMI - recorded: 25 Nutritional Status: BMI 25 -29 Overweight Nutritional Risks: Nausea/ vomitting/ diarrhea(nausea due to reflux) Diabetes: No  How often do you need to have someone help you when you read instructions, pamphlets, or other written materials from your doctor or pharmacy?: 1 - Never What is the last grade level you completed in school?: 12th grade  Interpreter Needed?: No  Information entered by :: Reather Littler LPN  Past Medical History:  Diagnosis Date  . Anxiety   . Arthritis    arthritis in back  . Barrett's esophagus   . COPD (chronic obstructive pulmonary disease) (HCC)    uses spiriva  . CPRS  1 (complex regional pain syndrome I) of upper limb 2007   formerly called rsd  . Depression    being treated for depression  . GERD (gastroesophageal reflux disease)   . H/O degenerative disc disease    chronic back pain  . Headache(784.0)    headaches due to pain  . Neuromuscular disorder (HCC)    rsd  . Pneumonia    2011 - hospitalized for 4 days  . Pneumonia   . Reflex sympathetic dystrophy   . Tobacco abuse    Past Surgical History:  Procedure Laterality Date  . ABDOMINAL HYSTERECTOMY  2005   partial  . CERVICAL DISC ARTHROPLASTY  2008  . ESOPHAGOGASTRODUODENOSCOPY (EGD) WITH PROPOFOL N/A 06/04/2017   Procedure: ESOPHAGOGASTRODUODENOSCOPY (EGD) WITH PROPOFOL;  Surgeon: Christena Deem, MD;  Location: [facility]COPY;  Service: Endoscopy;  Laterality: N/A;  . KNEE ARTHROSCOPY Right 2008  . LUMBAR LAMINECTOMY/DECOMPRESSION MICRODISCECTOMY  01/25/2011   Procedure: LUMBAR LAMINECTOMY/DECOMPRESSION MICRODISCECTOMY;  Surgeon: Alvy Beal;  Location: [facility] OR;  Service: Orthopedics;  Laterality: Left;  LEFT L5-S1 MICRODISCECTOMY, Central Decompression Lumbar five-sacral one  . OOPHORECTOMY Left 2011  . OOPHORECTOMY Right 10/2012  . SPINAL CORD STIMULATOR IMPLANT  2009   Removal-01/2012  . SPINAL CORD STIMULATOR REMOVAL  01/17/2012   Procedure: LUMBAR SPINAL CORD STIMULATOR REMOVAL;  Surgeon: Debria Garret  Shon Baton, MD;  Location: [facility] OR;  Service: Orthopedics;  Laterality: N/A;  Spinal Cord Battery Removal  . TUBAL LIGATION  1988   Family History  Problem Relation Age of Onset  . Depression Mother   . Ovarian cancer Mother   . Hypertension Mother   . Hypercholesterolemia Mother   . Obesity Mother   . Hypertension Sister   . Obesity Sister   . Stroke Sister   . Healthy Sister   . Healthy Daughter   . Healthy Daughter    Social History   Socioeconomic History  . Marital status: Divorced    Spouse name: Not on file  . Number of children: 2  . Years of education: Not on file    . Highest education level: 12th grade  Occupational History    Employer: UNEMPLOYED    Employer: DISABLED  Social Needs  . Financial resource strain: Not hard at all  . Food insecurity:    Worry: Never true    Inability: Never true  . Transportation needs:    Medical: No    Non-medical: No  Tobacco Use  . Smoking status: Former Smoker    Packs/day: 1.00    Years: 30.00    Pack years: 30.00    Types: Cigarettes    Start date: 09/10/1986    Last attempt to quit: 2018    Years since quitting: 2.1  . Smokeless tobacco: Never Used  Substance and Sexual Activity  . Alcohol use: No    Alcohol/week: 0.0 standard drinks  . Drug use: No  . Sexual activity: Yes    Partners: Male    Birth control/protection: Post-menopausal  Lifestyle  . Physical activity:    Days per week: 4 days    Minutes per session: 30 min  . Stress: To some extent  Relationships  . Social connections:    Talks on phone: More than three times a week    Gets together: Three times a week    Attends religious service: Never    Active member of club or organization: No    Attends meetings of clubs or organizations: Never    Relationship status: Divorced  Other Topics Concern  . Not on file  Social History Narrative  . Not on file    Outpatient Encounter Medications as of 05/20/2018  Medication Sig  . b complex vitamins capsule Take 1 capsule by mouth daily.  . Collagen 500 MG CAPS Take 3 capsules by mouth daily.  . diclofenac (FLECTOR) 1.3 % PTCH Place 1 patch onto the skin 2 (two) times daily as needed. For pain  . esomeprazole (NEXIUM) 40 MG capsule Take 1 capsule (40 mg total) by mouth 2 (two) times daily.  . famotidine (PEPCID) 20 MG tablet Take 1 tablet (20 mg total) by mouth 2 (two) times daily.  Marland Kitchen MAGNESIUM OXIDE PO Take 1,000 mg by mouth at bedtime.  . methocarbamol (ROBAXIN) 500 MG tablet Take 1 tablet by mouth at bedtime.  Marland Kitchen QUEtiapine (SEROQUEL) 25 MG tablet TAKE 1 TABLET(25 MG) BY MOUTH AT  BEDTIME  . umeclidinium-vilanterol (ANORO ELLIPTA) 62.5-25 MCG/INH AEPB Inhale 1 puff into the lungs daily.  Marland Kitchen venlafaxine XR (EFFEXOR-XR) 150 MG 24 hr capsule Take 1 capsule (150 mg total) by mouth daily with breakfast.  . [DISCONTINUED] diphenhydrAMINE (BENADRYL) 25 mg capsule Take 25 mg by mouth every 6 (six) hours as needed.  . [DISCONTINUED] acidophilus (RISAQUAD) CAPS capsule Take by mouth daily.  . [DISCONTINUED] Omega 3 1200 MG CAPS  Take 1 capsule by mouth 2 (two) times daily.  . [DISCONTINUED] Pyridoxine HCl (VITAMIN B-6) 250 MG tablet Take 250 mg by mouth daily.   No facility-administered encounter medications on file as of 05/20/2018.     Activities of Daily Living In your present state of health, do you have any difficulty performing the following activities: 05/20/2018 02/11/2018  Hearing? N N  Comment declines hearing aids -  Vision? N Y  Difficulty concentrating or making decisions? N N  Walking or climbing stairs? N N  Dressing or bathing? N N  Doing errands, shopping? N N  Preparing Food and eating ? N -  Using the Toilet? N -  In the past six months, have you accidently leaked urine? N -  Do you have problems with loss of bowel control? N -  Managing your Medications? N -  Managing your Finances? N -  Housekeeping or managing your Housekeeping? N -  Some recent data might be hidden    Patient Care Team: Alba CorySowles, Krichna, MD as PCP - General    Assessment:   This is a routine wellness examination for Tara BeechamCynthia.  Exercise Activities and Dietary recommendations Current Exercise Habits: Home exercise routine, Type of exercise: walking, Time (Minutes): 30, Frequency (Times/Week): 4, Weekly Exercise (Minutes/Week): 120, Intensity: Mild, Exercise limited by: orthopedic condition(s)  Goals    . DIET - INCREASE WATER INTAKE     Recommend to drink at least 6-8 8oz glasses of water per day.    . Weight (lb) < 155 lb (70.3 kg)     Pt states she would like to lose 15 pounds  over the next year       Fall Risk Fall Risk  05/20/2018 03/26/2018 02/11/2018 05/17/2017 11/28/2016  Falls in the past year? 0 0 0 No No  Number falls in past yr: 0 0 0 - -  Injury with Fall? 0 0 0 - -  Follow up Falls prevention discussed Falls evaluation completed - - -   FALL RISK PREVENTION PERTAINING TO THE HOME:  Any stairs in or around the home? NO If so, do they handrails? NO  Home free of loose throw rugs in walkways, pet beds, electrical cords, etc? Yes  Adequate lighting in your home to reduce risk of falls? Yes   ASSISTIVE DEVICES UTILIZED TO PREVENT FALLS:  Life alert? No  Use of a cane, walker or w/c? No  Grab bars in the bathroom? Yes  Shower chair or bench in shower? No  Elevated toilet seat or a handicapped toilet? Yes   DME ORDERS:  DME order needed?  No   TIMED UP AND GO:  Was the test performed? Yes .  Length of time to ambulate 10 feet: 5 sec.   GAIT:  Appearance of gait: Gait stead-fast and without the use of an assistive device.   Education: Fall risk prevention has been discussed.  Intervention(s) required? No   Depression Screen PHQ 2/9 Scores 05/20/2018 03/26/2018 02/11/2018 05/17/2017  PHQ - 2 Score 0 0 0 0  PHQ- 9 Score 4 2 4  0     Cognitive Function     6CIT Screen 05/20/2018 05/17/2017  What Year? 0 points 0 points  What month? 0 points 0 points  What time? 0 points 0 points  Count back from 20 0 points 0 points  Months in reverse 0 points 0 points  Repeat phrase 0 points 0 points  Total Score 0 0    Immunization History  Administered Date(s) Administered  . Influenza Split 11/29/2011  . Influenza, Seasonal, Injecte, Preservative Fre 01/10/2011  . Influenza,inj,Quad PF,6+ Mos 12/10/2012, 02/17/2014, 01/18/2015, 01/17/2016, 11/28/2016, 02/11/2018  . Influenza-Unspecified 02/17/2014  . Pneumococcal Conjugate-13 07/26/2015  . Pneumococcal Polysaccharide-23 01/10/2010  . Tdap 10/10/2011    Qualifies for Shingles Vaccine? Yes . Due  for Shingrix. Education has been provided regarding the importance of this vaccine. Pt has been advised to call insurance company to determine out of pocket expense. Advised may also receive vaccine at local pharmacy or Health Dept. Verbalized acceptance and understanding.  Tdap: Up to date  Flu Vaccine: Up to date  Pneumococcal Vaccine: Up to date   Screening Tests Health Maintenance  Topic Date Due  . MAMMOGRAM  06/05/2017  . TETANUS/TDAP  10/09/2021  . COLONOSCOPY  04/21/2024  . INFLUENZA VACCINE  Completed  . Hepatitis C Screening  Completed  . HIV Screening  Discontinued   Cancer Screenings:  Colorectal Screening: Completed 04/21/14. Repeat every 10 years.  Mammogram: Completed 06/05/16. Repeat every year. Ordered today. Pt provided with contact information and advised to call to schedule appt.   Bone Density: Completed 06/05/16. Results reflect  OSTEOPENIA. Repeat every 2 years. Ordered today.   Lung Cancer Screening: (Low Dose CT Chest recommended if Age 47-80 years, 30 pack-year currently smoking OR have quit w/in 15years.) does not qualify.    Additional Screening:  Hepatitis C Screening: does qualify; Completed 07/25/12  Vision Screening: Recommended annual ophthalmology exams for early detection of glaucoma and other disorders of the eye. Is the patient up to date with their annual eye exam?  No  Who is the provider or what is the name of the office in which the pt attends annual eye exams? No established provider If pt is not established with a provider, would they like to be referred to a provider to establish care? No .   Dental Screening: Recommended annual dental exams for proper oral hygiene  Community Resource Referral:  CRR required this visit?  No      Plan:    I have personally reviewed and addressed the Medicare Annual Wellness questionnaire and have noted the following in the patient's chart:  A. Medical and social history B. Use of alcohol,  tobacco or illicit drugs  C. Current medications and supplements D. Functional ability and status E.  Nutritional status F.  Physical activity G. Advance directives H. List of other physicians I.  Hospitalizations, surgeries, and ER visits in previous 12 months J.  Vitals K. Screenings such as hearing and vision if needed, cognitive and depression L. Referrals and appointments   In addition, I have reviewed and discussed with patient certain preventive protocols, quality metrics, and best practice recommendations. A written personalized care plan for preventive services as well as general preventive health recommendations were provided to patient.   Signed,  Reather Littler, LPN Nurse Health Advisor   Nurse Notes: pt c/o issues with acid reflux and scheduled with GI in May to discuss surgery for esophagus. She is otherwise doing well and appreciative of visit today.

## 2018-07-06 ENCOUNTER — Other Ambulatory Visit: Payer: Self-pay | Admitting: Family Medicine

## 2018-07-06 DIAGNOSIS — F339 Major depressive disorder, recurrent, unspecified: Secondary | ICD-10-CM

## 2018-08-12 ENCOUNTER — Ambulatory Visit: Payer: Medicare Other | Admitting: Family Medicine

## 2019-01-31 ENCOUNTER — Other Ambulatory Visit: Payer: Self-pay | Admitting: Family Medicine

## 2019-01-31 DIAGNOSIS — G4700 Insomnia, unspecified: Secondary | ICD-10-CM

## 2019-02-02 NOTE — Telephone Encounter (Signed)
appt made

## 2019-02-20 ENCOUNTER — Ambulatory Visit (INDEPENDENT_AMBULATORY_CARE_PROVIDER_SITE_OTHER): Payer: Medicare Other | Admitting: Family Medicine

## 2019-02-20 ENCOUNTER — Other Ambulatory Visit: Payer: Self-pay

## 2019-02-20 ENCOUNTER — Encounter: Payer: Self-pay | Admitting: Family Medicine

## 2019-02-20 DIAGNOSIS — F339 Major depressive disorder, recurrent, unspecified: Secondary | ICD-10-CM

## 2019-02-20 DIAGNOSIS — J449 Chronic obstructive pulmonary disease, unspecified: Secondary | ICD-10-CM

## 2019-02-20 DIAGNOSIS — E785 Hyperlipidemia, unspecified: Secondary | ICD-10-CM

## 2019-02-20 DIAGNOSIS — G4700 Insomnia, unspecified: Secondary | ICD-10-CM | POA: Diagnosis not present

## 2019-02-20 DIAGNOSIS — K219 Gastro-esophageal reflux disease without esophagitis: Secondary | ICD-10-CM

## 2019-02-20 DIAGNOSIS — Z79899 Other long term (current) drug therapy: Secondary | ICD-10-CM

## 2019-02-20 DIAGNOSIS — D692 Other nonthrombocytopenic purpura: Secondary | ICD-10-CM

## 2019-02-20 DIAGNOSIS — Z87891 Personal history of nicotine dependence: Secondary | ICD-10-CM

## 2019-02-20 MED ORDER — QUETIAPINE FUMARATE 25 MG PO TABS: ORAL_TABLET | ORAL | 3 refills | Status: DC

## 2019-02-20 MED ORDER — FAMOTIDINE 20 MG PO TABS: 20.0000 mg | ORAL_TABLET | Freq: Two times a day (BID) | ORAL | 0 refills | Status: DC

## 2019-02-20 MED ORDER — VENLAFAXINE HCL ER 150 MG PO CP24: ORAL_CAPSULE | ORAL | 0 refills | Status: DC

## 2019-02-20 MED ORDER — ANORO ELLIPTA 62.5-25 MCG/INH IN AEPB: 1.0000 | INHALATION_SPRAY | Freq: Every day | RESPIRATORY_TRACT | 3 refills | Status: DC

## 2019-02-20 MED ORDER — QUETIAPINE FUMARATE 25 MG PO TABS: ORAL_TABLET | ORAL | 1 refills | Status: DC

## 2019-02-20 MED ORDER — VENLAFAXINE HCL ER 150 MG PO CP24: ORAL_CAPSULE | ORAL | 3 refills | Status: DC

## 2019-02-20 NOTE — Progress Notes (Signed)
Name: Tara InglesCynthia M Schiefelbein   MRN: [id number]    DOB: 1965-11-21   Date:02/20/2019       Progress Note  Subjective  Chief Complaint  Chief Complaint  Patient presents with  . Medication Refill    I connected with  Tara [name]  on 02/20/19 at 11:00 AM EST by a video enabled telemedicine application and verified that I am speaking with the correct person using two identifiers.  I discussed the limitations of evaluation and management by telemedicine and the availability of in person appointments. The patient expressed understanding and agreed to proceed. Staff also discussed with the patient that there may be a patient responsible charge related to this service. Patient Location: at home [facility]   HPI  GERD/Barrett's esophagus: taking Nexium and Pepcid otc  twice daily  She was getting ready to have Nissen procedure but is on hold because of pandemic. Being careful about her diet and taking mediation and is doing well lately   Major Depression: shestates sheis stable.[place] and hanging out with her grandson gives her joy.Phq 9 is zero, in remission   Insomnia: she states sleeping well most of the time with Seroquel 25 mg qhs states even better than Ambien . She takes medication around 10 pm and she falls asleep within 30 minutes, she wakes up in the early morning between 4-6 am and unable to fall asleep. She states early awakening about 2-3 times a week . She does not nap  Dyslipidemia: on diet only, last HDL wasback to normal butLDL wasup again at 151, discussed life style modification. Unchanged  Reflex Sympathetic Dystrophy: she has been on disability since 2010, for RSD right arm and chronic back pain. She still has visits with pain clinic and gets epidurals injection, off pain medications for over year . Full Medicare coverage since July 2017.She uses  topical medication to control symptoms Unchanged   COPD mild: previous spirometry showed mild obstruction, she was having daily cough and occasional SOB, and decided to quit smoking 12/2016, she is feeling better, but still vapes. She states Anoro works well for her but she does not use it every day. She states she has days that he has a lot of coughing, she states sometimes triggered by GERD other times by itself. Discussed lung CT screen and she agreed  Senile Purpura: she states not bruising as much  Patient Active Problem List   Diagnosis Date Noted  . Gastropathy 07/24/2017  . Purpura, nonthrombocytopenic (HCC) 05/17/2017  . Fatty liver 01/28/2015  . Barrett esophagus 09/10/2014  . Major depression, recurrent, chronic (HCC) 09/08/2014  . Insomnia, persistent 09/08/2014  . Arthralgia, sacroiliac 09/08/2014  . Degeneration of lumbar or lumbosacral intervertebral disc 09/08/2014  . Gastro-esophageal reflux disease without esophagitis 09/08/2014  . Perennial allergic rhinitis 09/08/2014  . Nerve root disorder 09/08/2014  . Reflex sympathetic dystrophy 09/08/2014  . Retained orthopedic hardware 01/15/2012  . Chronic pain 01/25/2011    Past Surgical History:  Procedure Laterality Date  . ABDOMINAL HYSTERECTOMY  2005   partial  . CERVICAL DISC ARTHROPLASTY  2008  . ESOPHAGOGASTRODUODENOSCOPY (EGD) WITH PROPOFOL N/A 06/04/2017   Procedure: ESOPHAGOGASTRODUODENOSCOPY (EGD) WITH PROPOFOL;  Surgeon: Christena DeemSkulskie, Martin U, MD;  [facility];  Service: Endoscopy;  Laterality: N/A;  . KNEE ARTHROSCOPY Right 2008  . LUMBAR LAMINECTOMY/DECOMPRESSION MICRODISCECTOMY  01/25/2011   Procedure: LUMBAR LAMINECTOMY/DECOMPRESSION MICRODISCECTOMY;  Surgeon: Alvy Beal;  Location: [facility] OR;  Service: Orthopedics;  Laterality: Left;  LEFT L5-S1 MICRODISCECTOMY, Central Decompression Lumbar five-sacral one  . OOPHORECTOMY Left 2011  . OOPHORECTOMY Right 10/2012  . SPINAL CORD STIMULATOR  IMPLANT  2009   Removal-01/2012  . SPINAL CORD STIMULATOR REMOVAL  01/17/2012   Procedure: LUMBAR SPINAL CORD STIMULATOR REMOVAL;  Surgeon: Venita Lick, MD;  Location: [facility] OR;  Service: Orthopedics;  Laterality: N/A;  Spinal Cord Battery Removal  . TUBAL LIGATION  1988    Family History  Problem Relation Age of Onset  . Depression Mother   . Ovarian cancer Mother   . Hypertension Mother   . Hypercholesterolemia Mother   . Obesity Mother   . Hypertension Sister   . Obesity Sister   . Stroke Sister   . Healthy Sister   . Healthy Daughter   . Healthy Daughter     Social History   Socioeconomic History  . Marital status: Divorced    Spouse name: Not on file  . Number of children: 2  . Years of education: Not on file  . Highest education level: 12th grade  Occupational History    Employer: UNEMPLOYED    Employer: DISABLED  Tobacco Use  . Smoking status: Former Smoker    Packs/day: 1.00    Years: 30.00    Pack years: 30.00    Types: Cigarettes    Start date: 09/10/1986    Quit date: 2018    Years since quitting: 2.9  . Smokeless tobacco: Never Used  Substance and Sexual Activity  . Alcohol use: No    Alcohol/week: 0.0 standard drinks  . Drug use: No  . Sexual activity: Yes    Partners: Male    Birth control/protection: Post-menopausal  Other Topics Concern  . Not on file  Social History Narrative  . Not on file   Social Determinants of Health   Financial Resource Strain: Low Risk   . Difficulty of Paying Living Expenses: Not hard at all  Food Insecurity: No Food Insecurity  . Worried About Programme researcher, broadcasting/film/video in the Last Year: Never true  . Ran Out of Food in the Last Year: Never true  Transportation Needs: No Transportation Needs  . Lack of Transportation (Medical): No  . Lack of Transportation (Non-Medical): No  Physical Activity: Sufficiently Active  . Days of Exercise per Week: 4 days  . Minutes of Exercise per Session: 40 min  Stress: No Stress Concern  Present  . Feeling of Stress : Not at all  Social Connections: Somewhat Isolated  . Frequency of Communication with Friends and Family: More than three times a week  . Frequency of Social Gatherings with Friends and Family: More than three times a week  . Attends Religious Services: Never  . Active Member of Clubs or Organizations: No  . Attends Banker Meetings: Never  . Marital Status: Living with partner  Intimate Partner Violence: Not At Risk  . Fear of Current or Ex-Partner: No  . Emotionally Abused: No  . Physically Abused: No  . Sexually Abused: No     Current Outpatient Medications:  .  diclofenac (FLECTOR) 1.3 % PTCH, Place 1 patch onto the skin 2 (two) times daily as needed. For pain, Disp: , Rfl:  .  esomeprazole (NEXIUM) 40 MG capsule, Take 1 capsule (40 mg total) by mouth 2 (two) times daily., Disp: 30 capsule, Rfl: 2 .  MAGNESIUM OXIDE PO, Take 1,000 mg by mouth  at bedtime., Disp: , Rfl:  .  methocarbamol (ROBAXIN) 500 MG tablet, Take 1 tablet by mouth at bedtime., Disp: , Rfl:  .  umeclidinium-vilanterol (ANORO ELLIPTA) 62.5-25 MCG/INH AEPB, Inhale 1 puff into the lungs daily., Disp: 60 each, Rfl: 2 .  famotidine (PEPCID) 20 MG tablet, Take 1 tablet (20 mg total) by mouth 2 (two) times daily., Disp: 60 tablet, Rfl: 0 .  QUEtiapine (SEROQUEL) 25 MG tablet, TAKE 1 TABLET(25 MG) BY MOUTH AT BEDTIME, Disp: 30 tablet, Rfl: 1 .  venlafaxine XR (EFFEXOR-XR) 150 MG 24 hr capsule, TAKE 1 CAPSULE(150 MG) BY MOUTH DAILY WITH BREAKFAST, Disp: 90 capsule, Rfl: 0  Allergies  Allergen Reactions  . Aspirin Other (See Comments)    Causes heartburn even the chewable kind  . Clonidine Derivatives Itching    I personally reviewed active problem list, medication list, allergies, family history, social history, health maintenance with the patient/caregiver today.   ROS  Ten systems reviewed and is negative except as mentioned in HPI   Objective  Virtual encounter,  vitals not obtained.  There is no height or weight on file to calculate BMI.  Physical Exam  Awake, alert and oriented   PHQ2/9: Depression screen [facility] 2/9 02/20/2019 05/20/2018 03/26/2018 02/11/2018 05/17/2017  Decreased Interest 0 0 0 0 0  Down, Depressed, Hopeless 0 0 0 0 0  PHQ - 2 Score 0 0 0 0 0  Altered sleeping 0 3 2 2  0  Tired, decreased energy 0 1 0 1 0  Change in appetite 0 0 0 0 0  Feeling bad or failure about yourself  0 0 0 0 0  Trouble concentrating 0 0 0 0 0  Moving slowly or fidgety/restless 0 0 0 1 0  Suicidal thoughts 0 0 0 0 0  PHQ-9 Score 0 4 2 4  0  Difficult doing work/chores - Not difficult at all Not difficult at all Somewhat difficult Not difficult at all   PHQ-2/9 Result is negative.    Fall Risk: Fall Risk  02/20/2019 05/20/2018 03/26/2018 02/11/2018 05/17/2017  Falls in the past year? 0 0 0 0 No  Number falls in past yr: 0 0 0 0 -  Injury with Fall? 0 0 0 0 -  Follow up - Falls prevention discussed Falls evaluation completed - -    Assessment & Plan   1. Insomnia, persistent  - QUEtiapine (SEROQUEL) 25 MG tablet; TAKE 1 TABLET(25 MG) BY MOUTH AT BEDTIME  Dispense: 90 tablet; Refill: 3  2. Gastro-esophageal reflux disease without esophagitis  - famotidine (PEPCID) 20 MG tablet; Take 1 tablet (20 mg total) by mouth 2 (two) times daily.  Dispense: 60 tablet; Refill: 0  3. Major depression, recurrent, chronic (HCC)  - venlafaxine XR (EFFEXOR-XR) 150 MG 24 hr capsule; TAKE 1 CAPSULE(150 MG) BY MOUTH DAILY WITH BREAKFAST  Dispense: 90 capsule; Refill: 3  4. Long-term use of high-risk medication  - Lipid panel - Hemoglobin A1c  5. COPD, mild ([place])  - CT CHEST LUNG CA SCREEN LOW DOSE W/O CM; Future - umeclidinium-vilanterol (ANORO ELLIPTA) 62.5-25 MCG/INH AEPB; Inhale 1 puff into the lungs daily.  Dispense: 180 each; Refill: 3  6. Dyslipidemia  - Lipid panel - COMPLETE METABOLIC PANEL WITH GFR  7. Purpura, nonthrombocytopenic (HCC)  - CBC with  Differential/Platelet  8. History of tobacco use  - CT CHEST LUNG CA SCREEN LOW DOSE W/O CM; Future   I discussed the assessment and treatment plan with the patient. The patient  was provided an opportunity to ask questions and all were answered. The patient agreed with the plan and demonstrated an understanding of the instructions.  The patient was advised to call back or seek an in-person evaluation if the symptoms worsen or if the condition fails to improve as anticipated.  I provided 25  minutes of non-face-to-face time during this encounter.

## 2019-02-25 ENCOUNTER — Telehealth: Payer: Self-pay | Admitting: *Deleted

## 2019-02-25 NOTE — Telephone Encounter (Signed)
Received referral for low dose lung cancer screening CT scan. Message left at phone number listed in EMR for patient to call me back to facilitate scheduling scan.  

## 2019-03-24 ENCOUNTER — Telehealth: Payer: Self-pay | Admitting: *Deleted

## 2019-03-24 DIAGNOSIS — Z87891 Personal history of nicotine dependence: Secondary | ICD-10-CM

## 2019-03-24 NOTE — Telephone Encounter (Signed)
Received referral for initial lung cancer screening scan. Contacted patient and obtained smoking history,(former, quit 03/12/16, 30 pack year) as well as answering questions related to screening process. Patient denies signs of lung cancer such as weight loss or hemoptysis. Patient denies comorbidity that would prevent curative treatment if lung cancer were found. Patient is scheduled for shared decision making visit and CT scan on 04/01/19 at 1030am.

## 2019-03-27 ENCOUNTER — Other Ambulatory Visit: Payer: Self-pay | Admitting: Family Medicine

## 2019-03-27 DIAGNOSIS — J449 Chronic obstructive pulmonary disease, unspecified: Secondary | ICD-10-CM

## 2019-03-28 ENCOUNTER — Other Ambulatory Visit: Payer: Self-pay | Admitting: Family Medicine

## 2019-03-28 DIAGNOSIS — F339 Major depressive disorder, recurrent, unspecified: Secondary | ICD-10-CM

## 2019-04-01 ENCOUNTER — Inpatient Hospital Stay: Payer: Medicare PPO | Attending: Oncology | Admitting: Oncology

## 2019-04-01 ENCOUNTER — Ambulatory Visit
Admission: RE | Admit: 2019-04-01 | Discharge: 2019-04-01 | Disposition: A | Discharge status: Home / Self Care | Payer: Medicare PPO | Source: Ambulatory Visit | Attending: Oncology | Admitting: Oncology

## 2019-04-01 ENCOUNTER — Other Ambulatory Visit: Payer: Self-pay

## 2019-04-01 DIAGNOSIS — Z87891 Personal history of nicotine dependence: Secondary | ICD-10-CM | POA: Insufficient documentation

## 2019-04-01 NOTE — Progress Notes (Signed)
Virtual Visit via Video Note  I connected with Tara [name] on 04/01/19 at 10:30 AM EST by a video enabled telemedicine application and verified that I am speaking with the correct person using two identifiers.  Location: Patient: [facility] Provider: Office   I discussed the limitations of evaluation and management by telemedicine and the availability of in person appointments. The patient expressed understanding and agreed to proceed.  I discussed the assessment and treatment plan with the patient. The patient was provided an opportunity to ask questions and all were answered. The patient agreed with the plan and demonstrated an understanding of the instructions.   The patient was advised to call back or seek an in-person evaluation if the symptoms worsen or if the condition fails to improve as anticipated.   In accordance with CMS guidelines, patient has met eligibility criteria including age, absence of signs or symptoms of lung cancer.  Social History   Tobacco Use  . Smoking status: Former Smoker    Packs/day: 1.00    Years: 30.00    Pack years: 30.00    Types: Cigarettes    Start date: 09/10/1986    Quit date: 2018    Years since quitting: 3.0  . Smokeless tobacco: Never Used  Substance Use Topics  . Alcohol use: No    Alcohol/week: 0.0 standard drinks  . Drug use: No      A shared decision-making session was conducted prior to the performance of CT scan. This includes one or more decision aids, includes benefits and harms of screening, follow-up diagnostic testing, over-diagnosis, false positive rate, and total radiation exposure.   Counseling on the importance of adherence to annual lung cancer LDCT screening, impact of co-morbidities, and ability or willingness to undergo diagnosis and treatment is imperative for compliance of the program.   Counseling on the importance of continued smoking cessation for former smokers; the importance of smoking cessation for current smokers,  and information about tobacco cessation interventions have been given to patient including [place] and [facility] quit [place] programs.   Written order for lung cancer screening with LDCT has been given to the patient and any and all questions have been answered to the best of my abilities.    Yearly follow up will be coordinated by Burgess Estelle, Thoracic Navigator.  I provided 15 minutes of face-to-face video visit time during this encounter, and > 50% was spent counseling as documented under my assessment & plan.   Jacquelin Hawking, NP

## 2019-04-03 ENCOUNTER — Encounter: Payer: Self-pay | Admitting: *Deleted

## 2019-04-08 ENCOUNTER — Ambulatory Visit
Admission: RE | Admit: 2019-04-08 | Discharge: 2019-04-08 | Disposition: A | Discharge status: Home / Self Care | Payer: Medicare PPO | Source: Ambulatory Visit | Attending: Family Medicine | Admitting: Family Medicine

## 2019-04-08 ENCOUNTER — Other Ambulatory Visit: Payer: Self-pay

## 2019-04-08 DIAGNOSIS — Z13820 Encounter for screening for osteoporosis: Secondary | ICD-10-CM | POA: Diagnosis not present

## 2019-04-08 DIAGNOSIS — Z1231 Encounter for screening mammogram for malignant neoplasm of breast: Secondary | ICD-10-CM | POA: Insufficient documentation

## 2019-04-08 DIAGNOSIS — Z780 Asymptomatic menopausal state: Secondary | ICD-10-CM | POA: Insufficient documentation

## 2019-04-08 DIAGNOSIS — M85852 Other specified disorders of bone density and structure, left thigh: Secondary | ICD-10-CM | POA: Diagnosis not present

## 2019-04-08 DIAGNOSIS — M8580 Other specified disorders of bone density and structure, unspecified site: Secondary | ICD-10-CM | POA: Insufficient documentation

## 2019-04-08 NOTE — Telephone Encounter (Signed)
[employer] faxed our office that Anoro Ellipta is not covered but Stiolto Respimat Solution is the preferred alternative.

## 2019-04-09 ENCOUNTER — Other Ambulatory Visit: Payer: Self-pay | Admitting: Family Medicine

## 2019-04-09 DIAGNOSIS — N6310 Unspecified lump in the right breast, unspecified quadrant: Secondary | ICD-10-CM

## 2019-04-09 DIAGNOSIS — R928 Other abnormal and inconclusive findings on diagnostic imaging of breast: Secondary | ICD-10-CM

## 2019-04-11 MED ORDER — STIOLTO RESPIMAT 2.5-2.5 MCG/ACT IN AERS: 2.0000 | INHALATION_SPRAY | Freq: Every day | RESPIRATORY_TRACT | 3 refills | Status: DC

## 2019-04-14 ENCOUNTER — Ambulatory Visit
Admission: RE | Admit: 2019-04-14 | Discharge: 2019-04-14 | Disposition: A | Discharge status: Home / Self Care | Payer: Medicare PPO | Source: Ambulatory Visit | Attending: Family Medicine | Admitting: Family Medicine

## 2019-04-14 DIAGNOSIS — N6310 Unspecified lump in the right breast, unspecified quadrant: Secondary | ICD-10-CM

## 2019-04-14 DIAGNOSIS — N6001 Solitary cyst of right breast: Secondary | ICD-10-CM | POA: Diagnosis not present

## 2019-04-14 DIAGNOSIS — R928 Other abnormal and inconclusive findings on diagnostic imaging of breast: Secondary | ICD-10-CM | POA: Diagnosis not present

## 2019-04-27 ENCOUNTER — Other Ambulatory Visit: Payer: Self-pay | Admitting: Family Medicine

## 2019-04-27 MED ORDER — ESOMEPRAZOLE MAGNESIUM 40 MG PO CPDR: 40.0000 mg | DELAYED_RELEASE_CAPSULE | Freq: Two times a day (BID) | ORAL | 2 refills | Status: DC

## 2019-04-27 NOTE — Telephone Encounter (Signed)
Requested Prescriptions  Pending Prescriptions Disp Refills  . esomeprazole (NEXIUM) 40 MG capsule 30 capsule 2    Sig: Take 1 capsule (40 mg total) by mouth 2 (two) times daily.     Gastroenterology: Proton Pump Inhibitors Passed - 04/27/2019 11:57 AM      Passed - Valid encounter within last 12 months    Recent Outpatient Visits          2 months ago Major depression, recurrent, chronic [facility])   [facility] Alba Cory, MD   1 year ago Barrett's esophagus without dysplasia   [facility] Doren Custard, FNP   1 year ago Major depression, recurrent, chronic [facility])   [facility] Alba Cory, MD   1 year ago Major depression, recurrent, chronic [facility])   [facility] Alba Cory, MD   2 years ago Dyslipidemia   [facility] Alba Cory, MD      Future Appointments            In 4 weeks [facility]

## 2019-04-27 NOTE — Telephone Encounter (Signed)
Medication Refill - Medication: esomeprazole (NEXIUM) 40 MG capsule    Has the patient contacted their pharmacy? Yes.   (Agent: If no, request that the patient contact the pharmacy for the refill.) (Agent: If yes, when and what did the pharmacy advise?)  Preferred Pharmacy (with phone number or street name):  [id number] [place], [place] - [address] AT [facility] OF [address] & [address] [facility]  [address] [place] [address]  Phone: [phone] Fax: [phone]  Not a 24 hour pharmacy; exact hours not known.     Agent: Please be advised that RX refills may take up to 3 business days. We ask that you follow-up with your pharmacy.

## 2019-05-12 ENCOUNTER — Telehealth: Payer: Self-pay | Admitting: Family Medicine

## 2019-05-12 ENCOUNTER — Other Ambulatory Visit: Payer: Self-pay | Admitting: Family Medicine

## 2019-05-12 MED ORDER — ESOMEPRAZOLE MAGNESIUM 40 MG PO CPDR: 40.0000 mg | DELAYED_RELEASE_CAPSULE | Freq: Two times a day (BID) | ORAL | 2 refills | Status: DC

## 2019-05-12 NOTE — Telephone Encounter (Signed)
Pt stated her insurance company sent a letter asking her to have PCP submit prior auth for esomeprazole (NEXIUM) 40 MG capsule  so they will cover full rx. Please advise. Pt was only given a 2 week supply by pharmacy due to insurance not covering full 30 tablets.   [phone]

## 2019-05-12 NOTE — Telephone Encounter (Signed)
Please send in new prescription covering the full 30 day supply. She will need a rx for 60 tablets. I went through the process to do a PA only to find out it did not need one. She only needed the rx to be written correctly. Please send in new rx.

## 2019-05-26 ENCOUNTER — Ambulatory Visit (INDEPENDENT_AMBULATORY_CARE_PROVIDER_SITE_OTHER): Payer: Medicare PPO

## 2019-05-26 DIAGNOSIS — Z0000 Encounter for general adult medical examination without abnormal findings: Secondary | ICD-10-CM

## 2019-05-26 NOTE — Patient Instructions (Signed)
Tara [name] , Thank you for taking time to come for your Medicare Wellness Visit. I appreciate your ongoing commitment to your health goals. Please review the following plan we discussed and let me know if I can assist you in the future.   Screening recommendations/referrals: Colonoscopy: done 04/22/14 Mammogram: done 04/08/19 Bone Density: done 04/08/19 Recommended yearly ophthalmology/optometry visit for glaucoma screening and checkup Recommended yearly dental visit for hygiene and checkup  Vaccinations: Influenza vaccine: postponed Pneumococcal vaccine: done 07/26/15 Tdap vaccine: done 10/10/11 Shingles vaccine: Shingrix discussed. Please contact your pharmacy for coverage information.   Advanced directives: Advance directive discussed with you today. I have provided a copy for you to complete at home and have notarized. Once this is complete please bring a copy in to our office so we can scan it into your chart.  Conditions/risks identified: Keep up the great work!  Next appointment: Please follow up in one year for your Medicare Annual Wellness visit.    Preventive Care 40-64 Years, Female Preventive care refers to lifestyle choices and visits with your health care provider that can promote health and wellness. What does preventive care include?  A yearly physical exam. This is also called an annual well check.  Dental exams once or twice a year.  Routine eye exams. Ask your health care provider how often you should have your eyes checked.  Personal lifestyle choices, including:  Daily care of your teeth and gums.  Regular physical activity.  Eating a healthy diet.  Avoiding tobacco and drug use.  Limiting alcohol use.  Practicing safe sex.  Taking low-dose aspirin daily starting at age 4.  Taking vitamin and mineral supplements as recommended by your health care provider. What happens during an annual well check? The services and screenings done by your health care  provider during your annual well check will depend on your age, overall health, lifestyle risk factors, and family history of disease. Counseling  Your health care provider may ask you questions about your:  Alcohol use.  Tobacco use.  Drug use.  Emotional well-being.  Home and relationship well-being.  Sexual activity.  Eating habits.  Work and work Statistician.  Method of birth control.  Menstrual cycle.  Pregnancy history. Screening  You may have the following tests or measurements:  Height, weight, and BMI.  Blood pressure.  Lipid and cholesterol levels. These may be checked every 5 years, or more frequently if you are over 12 years old.  Skin check.  Lung cancer screening. You may have this screening every year starting at age 60 if you have a 30-pack-year history of smoking and currently smoke or have quit within the past 15 years.  Fecal occult blood test (FOBT) of the stool. You may have this test every year starting at age 19.  Flexible sigmoidoscopy or colonoscopy. You may have a sigmoidoscopy every 5 years or a colonoscopy every 10 years starting at age 64.  Hepatitis C blood test.  Hepatitis B blood test.  Sexually transmitted disease (STD) testing.  Diabetes screening. This is done by checking your blood sugar (glucose) after you have not eaten for a while (fasting). You may have this done every 1-3 years.  Mammogram. This may be done every 1-2 years. Talk to your health care provider about when you should start having regular mammograms. This may depend on whether you have a family history of breast cancer.  BRCA-related cancer screening. This may be done if you have a family history of breast, ovarian,  tubal, or peritoneal cancers.  Pelvic exam and Pap test. This may be done every 3 years starting at age 79. Starting at age 2, this may be done every 5 years if you have a Pap test in combination with an HPV test.  Bone density scan. This is done  to screen for osteoporosis. You may have this scan if you are at high risk for osteoporosis. Discuss your test results, treatment options, and if necessary, the need for more tests with your health care provider. Vaccines  Your health care provider may recommend certain vaccines, such as:  Influenza vaccine. This is recommended every year.  Tetanus, diphtheria, and acellular pertussis (Tdap, Td) vaccine. You may need a Td booster every 10 years.  Zoster vaccine. You may need this after age 4.  Pneumococcal 13-valent conjugate (PCV13) vaccine. You may need this if you have certain conditions and were not previously vaccinated.  Pneumococcal polysaccharide (PPSV23) vaccine. You may need one or two doses if you smoke cigarettes or if you have certain conditions. Talk to your health care provider about which screenings and vaccines you need and how often you need them. This information is not intended to replace advice given to you by your health care provider. Make sure you discuss any questions you have with your health care provider. Document Released: 03/25/2015 Document Revised: 11/16/2015 Document Reviewed: 12/28/2014 Elsevier Interactive Patient Education  2017 [place] Prevention in the Home Falls can cause injuries. They can happen to people of all ages. There are many things you can do to make your home safe and to help prevent falls. What can I do on the outside of my home?  Regularly fix the edges of walkways and driveways and fix any cracks.  Remove anything that might make you trip as you walk through a door, such as a raised step or threshold.  Trim any bushes or trees on the path to your home.  Use bright outdoor lighting.  Clear any walking paths of anything that might make someone trip, such as rocks or tools.  Regularly check to see if handrails are loose or broken. Make sure that both sides of any steps have handrails.  Any raised decks and porches  should have guardrails on the edges.  Have any leaves, snow, or ice cleared regularly.  Use sand or salt on walking paths during winter.  Clean up any spills in your garage right away. This includes oil or grease spills. What can I do in the bathroom?  Use night lights.  Install grab bars by the toilet and in the tub and shower. Do not use towel bars as grab bars.  Use non-skid mats or decals in the tub or shower.  If you need to sit down in the shower, use a plastic, non-slip stool.  Keep the floor dry. Clean up any water that spills on the floor as soon as it happens.  Remove soap buildup in the tub or shower regularly.  Attach bath mats securely with double-sided non-slip rug tape.  Do not have throw rugs and other things on the floor that can make you trip. What can I do in the bedroom?  Use night lights.  Make sure that you have a light by your bed that is easy to reach.  Do not use any sheets or blankets that are too big for your bed. They should not hang down onto the floor.  Have a firm chair that has side arms.  You can use this for support while you get dressed.  Do not have throw rugs and other things on the floor that can make you trip. What can I do in the kitchen?  Clean up any spills right away.  Avoid walking on wet floors.  Keep items that you use a lot in easy-to-reach places.  If you need to reach something above you, use a strong step stool that has a grab bar.  Keep electrical cords out of the way.  Do not use floor polish or wax that makes floors slippery. If you must use wax, use non-skid floor wax.  Do not have throw rugs and other things on the floor that can make you trip. What can I do with my stairs?  Do not leave any items on the stairs.  Make sure that there are handrails on both sides of the stairs and use them. Fix handrails that are broken or loose. Make sure that handrails are as long as the stairways.  Check any carpeting to  make sure that it is firmly attached to the stairs. Fix any carpet that is loose or worn.  Avoid having throw rugs at the top or bottom of the stairs. If you do have throw rugs, attach them to the floor with carpet tape.  Make sure that you have a light switch at the top of the stairs and the bottom of the stairs. If you do not have them, ask someone to add them for you. What else can I do to help prevent falls?  Wear shoes that:  Do not have high heels.  Have rubber bottoms.  Are comfortable and fit you well.  Are closed at the toe. Do not wear sandals.  If you use a stepladder:  Make sure that it is fully opened. Do not climb a closed stepladder.  Make sure that both sides of the stepladder are locked into place.  Ask someone to hold it for you, if possible.  Clearly mark and make sure that you can see:  Any grab bars or handrails.  First and last steps.  Where the edge of each step is.  Use tools that help you move around (mobility aids) if they are needed. These include:  Canes.  Walkers.  Scooters.  Crutches.  Turn on the lights when you go into a dark area. Replace any light bulbs as soon as they burn out.  Set up your furniture so you have a clear path. Avoid moving your furniture around.  If any of your floors are uneven, fix them.  If there are any pets around you, be aware of where they are.  Review your medicines with your doctor. Some medicines can make you feel dizzy. This can increase your chance of falling. Ask your doctor what other things that you can do to help prevent falls. This information is not intended to replace advice given to you by your health care provider. Make sure you discuss any questions you have with your health care provider. Document Released: 12/23/2008 Document Revised: 08/04/2015 Document Reviewed: 04/02/2014 Elsevier Interactive Patient Education  2017 [employer].

## 2019-05-26 NOTE — Progress Notes (Signed)
Subjective:   Tara [name] is a 54 y.o. female who presents for Medicare Annual (Subsequent) preventive examination.  Virtual Visit via Telephone Note  I connected with Tara InglesCynthia M Helms on 05/26/19 at 10:40 AM EDT by a telehealth enabled device and verified that I am speaking with the correct person using two identifiers.  Medicare Annual Wellness visit completed via video chat due to Covid-19 pandemic.   Location: Patient: home Provider: office   I discussed the limitations, risks, security and privacy concerns of performing an evaluation and management service by telephone and the availability of in person appointments. The patient expressed understanding and agreed to proceed.  Some vital signs may be absent or patient reported.   Reather LittlerKasey Arabella Revelle, LPN     Review of Systems:   Cardiac Risk Factors include: none     Objective:     Vitals: There were no vitals taken for this visit.  There is no height or weight on file to calculate BMI.  Advanced Directives 05/26/2019 05/20/2018 06/04/2017 05/17/2017 11/28/2016 07/06/2016 04/26/2016  Does Patient Have a Medical Advance Directive? No No No No No No No  Would patient like information on creating a medical advance directive? Yes (MAU/Ambulatory/Procedural Areas - Information given) No - Patient declined No - Patient declined Yes (MAU/Ambulatory/Procedural Areas - Information given) - - -  Pre-existing out of facility DNR order (yellow form or pink MOST form) - - - - - - -    Tobacco Social History   Tobacco Use  Smoking Status Former Smoker  . Packs/day: 1.00  . Years: 30.00  . Pack years: 30.00  . Types: Cigarettes  . Start date: 09/10/1986  . Quit date: 2018  . Years since quitting: 3.2  Smokeless Tobacco Never Used     Counseling given: Not Answered   Clinical Intake:  Pre-visit preparation completed: Yes  Pain : No/denies pain     Nutritional Risks: None Diabetes: No  How often do you need to have someone  help you when you read instructions, pamphlets, or other written materials from your doctor or pharmacy?: 1 - Never  Interpreter Needed?: No  Information entered by :: Reather LittlerKasey Akia Montalban LPN  Past Medical History:  Diagnosis Date  . Anxiety   . Arthritis    arthritis in back  . Barrett's esophagus   . COPD (chronic obstructive pulmonary disease) (HCC)    uses spiriva  . CPRS 1 (complex regional pain syndrome I) of upper limb 2007   formerly called rsd  . Depression    being treated for depression  . GERD (gastroesophageal reflux disease)   . H/O degenerative disc disease    chronic back pain  . Headache(784.0)    headaches due to pain  . Neuromuscular disorder (HCC)    rsd  . Pneumonia    2011 - hospitalized for 4 days  . Pneumonia   . Reflex sympathetic dystrophy   . Tobacco abuse    Past Surgical History:  Procedure Laterality Date  . ABDOMINAL HYSTERECTOMY  2005   partial  . CERVICAL DISC ARTHROPLASTY  2008  . ESOPHAGOGASTRODUODENOSCOPY (EGD) WITH PROPOFOL N/A 06/04/2017   Procedure: ESOPHAGOGASTRODUODENOSCOPY (EGD) WITH PROPOFOL;  Surgeon: Christena DeemSkulskie, Martin U, MD;  [facility];  Service: Endoscopy;  Laterality: N/A;  . KNEE ARTHROSCOPY Right 2008  . LUMBAR LAMINECTOMY/DECOMPRESSION MICRODISCECTOMY  01/25/2011   Procedure: LUMBAR LAMINECTOMY/DECOMPRESSION MICRODISCECTOMY;  Surgeon: Alvy Bealahari D Brooks;  Location: [facility] OR;  Service: Orthopedics;  Laterality: Left;  LEFT L5-S1 MICRODISCECTOMY,  Central Decompression Lumbar five-sacral one  . OOPHORECTOMY Left 2011  . OOPHORECTOMY Right 10/2012  . SPINAL CORD STIMULATOR IMPLANT  2009   Removal-01/2012  . SPINAL CORD STIMULATOR REMOVAL  01/17/2012   Procedure: LUMBAR SPINAL CORD STIMULATOR REMOVAL;  Surgeon: Venita Lick, MD;  Location: [facility] OR;  Service: Orthopedics;  Laterality: N/A;  Spinal Cord Battery Removal  . TUBAL LIGATION  1988   Family History  Problem Relation Age of Onset  . Depression Mother   . Ovarian  cancer Mother   . Hypertension Mother   . Hypercholesterolemia Mother   . Obesity Mother   . Hypertension Sister   . Obesity Sister   . Stroke Sister   . Healthy Sister   . Healthy Daughter   . Tongue cancer Daughter   . Healthy Daughter   . Breast cancer Neg Hx    Social History   Socioeconomic History  . Marital status: Divorced    Spouse name: Not on file  . Number of children: 2  . Years of education: Not on file  . Highest education level: 12th grade  Occupational History    Employer: UNEMPLOYED    Employer: DISABLED  Tobacco Use  . Smoking status: Former Smoker    Packs/day: 1.00    Years: 30.00    Pack years: 30.00    Types: Cigarettes    Start date: 09/10/1986    Quit date: 2018    Years since quitting: 3.2  . Smokeless tobacco: Never Used  Substance and Sexual Activity  . Alcohol use: No    Alcohol/week: 0.0 standard drinks  . Drug use: No  . Sexual activity: Yes    Partners: Male    Birth control/protection: Post-menopausal  Other Topics Concern  . Not on file  Social History Narrative  . Not on file   Social Determinants of Health   Financial Resource Strain: Low Risk   . Difficulty of Paying Living Expenses: Not hard at all  Food Insecurity: No Food Insecurity  . Worried About Programme researcher, broadcasting/film/video in the Last Year: Never true  . Ran Out of Food in the Last Year: Never true  Transportation Needs: No Transportation Needs  . Lack of Transportation (Medical): No  . Lack of Transportation (Non-Medical): No  Physical Activity: Sufficiently Active  . Days of Exercise per Week: 4 days  . Minutes of Exercise per Session: 40 min  Stress: Stress Concern Present  . Feeling of Stress : Rather much  Social Connections: Somewhat Isolated  . Frequency of Communication with Friends and Family: More than three times a week  . Frequency of Social Gatherings with Friends and Family: More than three times a week  . Attends Religious Services: Never  . Active  Member of Clubs or Organizations: No  . Attends Banker Meetings: Never  . Marital Status: Living with partner    Outpatient Encounter Medications as of 05/26/2019  Medication Sig  . Calcium 600-200 MG-UNIT tablet Take 1 tablet by mouth daily.  . diclofenac (FLECTOR) 1.3 % PTCH Place 1 patch onto the skin 2 (two) times daily as needed. For pain  . esomeprazole (NEXIUM) 40 MG capsule Take 1 capsule (40 mg total) by mouth 2 (two) times daily.  . famotidine (PEPCID) 20 MG tablet Take 1 tablet (20 mg total) by mouth 2 (two) times daily.  . QUEtiapine (SEROQUEL) 25 MG tablet TAKE 1 TABLET(25 MG) BY MOUTH AT BEDTIME  . Tiotropium Bromide-Olodaterol (STIOLTO RESPIMAT) 2.5-2.5 MCG/ACT  AERS Inhale 2 puffs into the lungs daily. In place of Anoro  . venlafaxine XR (EFFEXOR-XR) 150 MG 24 hr capsule TAKE 1 CAPSULE(150 MG) BY MOUTH DAILY WITH BREAKFAST  . [DISCONTINUED] calcium-vitamin D (OSCAL WITH D) 500-200 MG-UNIT tablet Take 1 tablet by mouth.  . [DISCONTINUED] MAGNESIUM OXIDE PO Take 1,000 mg by mouth at bedtime.  . [DISCONTINUED] methocarbamol (ROBAXIN) 500 MG tablet Take 1 tablet by mouth at bedtime.   No facility-administered encounter medications on file as of 05/26/2019.    Activities of Daily Living In your present state of health, do you have any difficulty performing the following activities: 05/26/2019 02/20/2019  Hearing? N N  Comment declines hearing aids -  Vision? N N  Difficulty concentrating or making decisions? N N  Walking or climbing stairs? N N  Dressing or bathing? N N  Doing errands, shopping? N N  Preparing Food and eating ? N -  Using the Toilet? N -  In the past six months, have you accidently leaked urine? N -  Do you have problems with loss of bowel control? N -  Managing your Medications? N -  Managing your Finances? N -  Housekeeping or managing your Housekeeping? N -  Some recent data might be hidden    Patient Care Team: Steele Sizer, MD as  PCP - General    Assessment:   This is a routine wellness examination for [place].  Exercise Activities and Dietary recommendations Current Exercise Habits: Home exercise routine, Type of exercise: walking, Time (Minutes): 40, Frequency (Times/Week): 4, Weekly Exercise (Minutes/Week): 160, Intensity: Moderate, Exercise limited by: orthopedic condition(s)  Goals    . DIET - INCREASE WATER INTAKE     Recommend to drink at least 6-8 8oz glasses of water per day.    . Weight (lb) < 155 lb (70.3 kg)     Pt states she would like to lose 15 pounds over the next year       Fall Risk Fall Risk  05/26/2019 02/20/2019 05/20/2018 03/26/2018 02/11/2018  Falls in the past year? 0 0 0 0 0  Number falls in past yr: 0 0 0 0 0  Injury with Fall? 0 0 0 0 0  Risk for fall due to : No Fall Risks - - - -  Follow up Falls prevention discussed - Falls prevention discussed Falls evaluation completed -   FALL RISK PREVENTION PERTAINING TO THE HOME:  Any stairs in or around the home? Yes  If so, do they handrails? Yes   Home free of loose throw rugs in walkways, pet beds, electrical cords, etc? Yes  Adequate lighting in your home to reduce risk of falls? Yes   ASSISTIVE DEVICES UTILIZED TO PREVENT FALLS:  Life alert? No  Use of a cane, walker or w/c? No  Grab bars in the bathroom? Yes  Shower chair or bench in shower? Yes  Elevated toilet seat or a handicapped toilet? Yes   DME ORDERS:  DME order needed?  No   TIMED UP AND GO:  Was the test performed? No . Virtual visit.   Education: Fall risk prevention has been discussed.  Intervention(s) required? No   Depression Screen PHQ 2/9 Scores 05/26/2019 02/20/2019 05/20/2018 03/26/2018  PHQ - 2 Score 0 0 0 0  PHQ- 9 Score - 0 4 2     Cognitive Function     6CIT Screen 05/20/2018 05/17/2017  What Year? 0 points 0 points  What month? 0 points 0 points  What time? 0 points 0 points  Count back from 20 0 points 0 points  Months in reverse 0  points 0 points  Repeat phrase 0 points 0 points  Total Score 0 0    Immunization History  Administered Date(s) Administered  . Influenza Split 11/29/2011  . Influenza, Seasonal, Injecte, Preservative Fre 01/10/2011  . Influenza,inj,Quad PF,6+ Mos 12/10/2012, 02/17/2014, 01/18/2015, 01/17/2016, 11/28/2016, 02/11/2018  . Influenza-Unspecified 02/17/2014  . Pneumococcal Conjugate-13 07/26/2015  . Pneumococcal Polysaccharide-23 01/10/2010  . Tdap 10/10/2011    Qualifies for Shingles Vaccine? Yes . Due for Shingrix. Education has been provided regarding the importance of this vaccine. Pt has been advised to call insurance company to determine out of pocket expense. Advised may also receive vaccine at local pharmacy or Health Dept. Verbalized acceptance and understanding.  Tdap: Up to date  Flu Vaccine: Due for Flu vaccine. Does the patient want to receive this vaccine today?  No . Education has been provided regarding the importance of this vaccine but still declined. Advised may receive this vaccine at local pharmacy or Health Dept. Aware to provide a copy of the vaccination record if obtained from local pharmacy or Health Dept. Verbalized acceptance and understanding.  Pneumococcal Vaccine: Up to date   Screening Tests Health Maintenance  Topic Date Due  . INFLUENZA VACCINE  10/11/2018  . MAMMOGRAM  04/07/2020  . DEXA SCAN  04/07/2021  . TETANUS/TDAP  10/09/2021  . COLONOSCOPY  04/21/2024  . Hepatitis C Screening  Completed  . HIV Screening  Discontinued    Cancer Screenings:  Colorectal Screening: Completed 04/21/14. Repeat every 10 years  Mammogram: Completed 04/08/19. Repeat every year.  Bone Density: Completed 04/08/19. Results reflect NORMAL. Repeat every 2 years.   Lung Cancer Screening: (Low Dose CT Chest recommended if Age 26-80 years, 30 pack-year currently smoking OR have quit w/in 15years.) does qualify. Completed 04/01/19.   Additional Screening:  Hepatitis C  Screening: does qualify; Completed 07/25/12  Vision Screening: Recommended annual ophthalmology exams for early detection of glaucoma and other disorders of the eye. Is the patient up to date with their annual eye exam?  No  Who is the provider or what is the name of the office in which the pt attends annual eye exams? Not established  If pt is not established with a provider, would they like to be referred to a provider to establish care? No . Pt to contact [employer] for preferred provider if possible.   Dental Screening: Recommended annual dental exams for proper oral hygiene  Community Resource Referral:  CRR required this visit?  No      Plan:     I have personally reviewed and addressed the Medicare Annual Wellness questionnaire and have noted the following in the patient's chart:  A. Medical and social history B. Use of alcohol, tobacco or illicit drugs  C. Current medications and supplements D. Functional ability and status E.  Nutritional status F.  Physical activity G. Advance directives H. List of other physicians I.  Hospitalizations, surgeries, and ER visits in previous 12 months J.  Vitals K. Screenings such as hearing and vision if needed, cognitive and depression L. Referrals and appointments   In addition, I have reviewed and discussed with patient certain preventive protocols, quality metrics, and best practice recommendations. A written personalized care plan for preventive services as well as general preventive health recommendations were provided to patient.   Signed,  Reather Littler, LPN Nurse Health Advisor   Nurse Notes: Pt  feeling somewhat anxious due to daughter's recent diagnosis of tongue cancer and upcoming surgery but otherwise doing okay.   Pt advised pt to come in to complete labs and information provided regarding how to sign up for Covid vaccine.

## 2019-05-29 ENCOUNTER — Telehealth: Payer: Self-pay | Admitting: Family Medicine

## 2019-05-29 NOTE — Telephone Encounter (Signed)
Copied from CRM [id number]. Topic: General - Other >> May 29, 2019 12:35 PM Tamela Oddi wrote: Reason for CRM: Patient called to request a PA for her medication, esomeprazole (NEXIUM) 40 MG capsule, which the pharmacy said she will need.  Please advise and call to confirm at [phone]

## 2019-06-01 NOTE — Telephone Encounter (Signed)
Performed PA for Esomeprazole 40 mg today on [email or web address]

## 2019-06-02 NOTE — Telephone Encounter (Signed)
Patient called in checking on status of PA. Please advise.

## 2019-06-02 NOTE — Telephone Encounter (Signed)
Nexium has been approved until 03/11/2020, patient and pharmacy has been notified.

## 2019-07-09 ENCOUNTER — Ambulatory Visit (INDEPENDENT_AMBULATORY_CARE_PROVIDER_SITE_OTHER): Payer: Medicare PPO | Admitting: Family Medicine

## 2019-07-09 ENCOUNTER — Other Ambulatory Visit: Payer: Self-pay

## 2019-07-09 ENCOUNTER — Encounter: Payer: Self-pay | Admitting: Family Medicine

## 2019-07-09 DIAGNOSIS — J432 Centrilobular emphysema: Secondary | ICD-10-CM | POA: Diagnosis not present

## 2019-07-09 DIAGNOSIS — E785 Hyperlipidemia, unspecified: Secondary | ICD-10-CM

## 2019-07-09 DIAGNOSIS — F331 Major depressive disorder, recurrent, moderate: Secondary | ICD-10-CM | POA: Diagnosis not present

## 2019-07-09 DIAGNOSIS — D692 Other nonthrombocytopenic purpura: Secondary | ICD-10-CM

## 2019-07-09 DIAGNOSIS — F419 Anxiety disorder, unspecified: Secondary | ICD-10-CM

## 2019-07-09 DIAGNOSIS — K219 Gastro-esophageal reflux disease without esophagitis: Secondary | ICD-10-CM | POA: Diagnosis not present

## 2019-07-09 DIAGNOSIS — G4700 Insomnia, unspecified: Secondary | ICD-10-CM

## 2019-07-09 MED ORDER — TRELEGY ELLIPTA 100-62.5-25 MCG/INH IN AEPB: 1.0000 | INHALATION_SPRAY | Freq: Every day | RESPIRATORY_TRACT | 1 refills | Status: DC

## 2019-07-09 MED ORDER — VENLAFAXINE HCL ER 150 MG PO CP24: 150.0000 mg | ORAL_CAPSULE | Freq: Every day | ORAL | 0 refills | Status: DC

## 2019-07-09 MED ORDER — BUSPIRONE HCL 5 MG PO TABS: 5.0000 mg | ORAL_TABLET | Freq: Three times a day (TID) | ORAL | 0 refills | Status: DC

## 2019-07-09 MED ORDER — ESOMEPRAZOLE MAGNESIUM 40 MG PO CPDR: 40.0000 mg | DELAYED_RELEASE_CAPSULE | Freq: Two times a day (BID) | ORAL | 1 refills | Status: DC

## 2019-07-09 NOTE — Progress Notes (Signed)
Name: Tara [name]   MRN: [id number]    DOB: 1965/05/01   Date:07/09/2019       Progress Note  Subjective  Chief Complaint  Chief Complaint  Patient presents with  . Depression    follow up, medication refills    I connected with  [name] on 07/09/19 at 10:40 AM EDT by telephone and verified that I am speaking with the correct person using two identifiers.  I discussed the limitations, risks, security and privacy concerns of performing an evaluation and management service by telephone and the availability of in person appointments. Staff also discussed with the patient that there may be a patient responsible charge related to this service. Patient Location: at home  Provider Location: [facility]   HPI  GERD/Barrett's esophagus: taking Nexium andPepcid otctwice daily to help with symptoms, she states it helps but is not completely controlled.  She was going to have Nissen procedure but was postponed because of pandemic, and now not sure since her sister had the procedure and is now having symptoms again.   Major Depression: sheis not doing well, very worried about her daughter that was diagnosed with tongue squamous carcinoma and mother diagnosed with recurrence of ovarian cancer around the same time in March. Phq 9 is high also feeling nervous and jittery inside, she states episodes can last minutes, happening almost daily , she is still able to sleep because of seroquel but is starting to wake up in the middle of the night.   Insomnia: shestates sleeping well most of the time with Seroquel 25 mg qhs states even better than Ambien. She takes medication around 10 pm and she falls asleep within 30 minutes, she states since more stressed waking up in the early am again. No side effects  Dyslipidemia: on diet only, last HDL wasback to normal butLDL wasup again at 151, discussed life style modification. Discussed importance of having labs done within the next couple of  weeks.   Reflex Sympathetic Dystrophy: she has been on disability since 2010, for RSD right arm and chronic back pain. She still has visits with pain clinic and gets epidurals injection, off pain medicationsfor over year. Full Medicare coverage since July 2017.She was doing well with topical medication but getting worse secondary to pain , about 5/10   COPD mild:previous spirometry showed mild obstruction,she was having daily cough, SOB and intermittent wheezing  and decided to quit smoking 12/2016, but continued to have some SOB with activity, we added Anoro and she was doing well however insurance stopped paying for it, since changed to [employer] she noticed increase of symptoms again, more SOB and occasional cough - seems to be secondary to GERD. No wheezing. We will try switching to Trelegy to see if symptoms improves again . She had CT lung done 04/01/2019 showed emphysema   Senile Purpura: she states not bruising as much, unchanged   Patient Active Problem List   Diagnosis Date Noted  . Gastropathy 07/24/2017  . Purpura, nonthrombocytopenic (HCC) 05/17/2017  . Fatty liver 01/28/2015  . Barrett esophagus 09/10/2014  . Major depression, recurrent, chronic (HCC) 09/08/2014  . Insomnia, persistent 09/08/2014  . Arthralgia, sacroiliac 09/08/2014  . Degeneration of lumbar or lumbosacral intervertebral disc 09/08/2014  . Gastro-esophageal reflux disease without esophagitis 09/08/2014  . Perennial allergic rhinitis 09/08/2014  . Nerve root disorder 09/08/2014  . Reflex sympathetic dystrophy 09/08/2014  . Retained orthopedic hardware 01/15/2012  . Chronic pain 01/25/2011    Past Surgical History:  Procedure Laterality Date  . ABDOMINAL HYSTERECTOMY  2005   partial  . CERVICAL DISC ARTHROPLASTY  2008  . ESOPHAGOGASTRODUODENOSCOPY (EGD) WITH PROPOFOL N/A 06/04/2017   Procedure: ESOPHAGOGASTRODUODENOSCOPY (EGD) WITH PROPOFOL;  Surgeon: Lollie Sails, MD;  Location: [facility]  ENDOSCOPY;  Service: Endoscopy;  Laterality: N/A;  . KNEE ARTHROSCOPY Right 2008  . LUMBAR LAMINECTOMY/DECOMPRESSION MICRODISCECTOMY  01/25/2011   Procedure: LUMBAR LAMINECTOMY/DECOMPRESSION MICRODISCECTOMY;  Surgeon: Dahlia Bailiff;  Location: [place];  Service: Orthopedics;  Laterality: Left;  LEFT L5-S1 MICRODISCECTOMY, Central Decompression Lumbar five-sacral one  . OOPHORECTOMY Left 2011  . OOPHORECTOMY Right 10/2012  . SPINAL CORD STIMULATOR IMPLANT  2009   Removal-01/2012  . SPINAL CORD STIMULATOR REMOVAL  01/17/2012   Procedure: LUMBAR SPINAL CORD STIMULATOR REMOVAL;  Surgeon: Melina Schools, MD;  Location: [place];  Service: Orthopedics;  Laterality: N/A;  Spinal Cord Battery Removal  . TUBAL LIGATION  1988    Family History  Problem Relation Age of Onset  . Depression Mother   . Ovarian cancer Mother   . Hypertension Mother   . Hypercholesterolemia Mother   . Obesity Mother   . Hypertension Sister   . Obesity Sister   . Stroke Sister   . Healthy Sister   . Healthy Daughter   . Tongue cancer Daughter   . Healthy Daughter   . Breast cancer Neg Hx     Social History   Socioeconomic History  . Marital status: Divorced    Spouse name: Not on file  . Number of children: 2  . Years of education: Not on file  . Highest education level: 12th grade  Occupational History    Employer: UNEMPLOYED    Employer: DISABLED  Tobacco Use  . Smoking status: Former Smoker    Packs/day: 1.00    Years: 30.00    Pack years: 30.00    Types: Cigarettes    Start date: 09/10/1986    Quit date: 2018    Years since quitting: 3.3  . Smokeless tobacco: Never Used  Substance and Sexual Activity  . Alcohol use: No    Alcohol/week: 0.0 standard drinks  . Drug use: No  . Sexual activity: Yes    Partners: Male    Birth control/protection: Post-menopausal  Other Topics Concern  . Not on file  Social History Narrative  . Not on file   Social Determinants of Health   Financial Resource  Strain: Low Risk   . Difficulty of Paying Living Expenses: Not hard at all  Food Insecurity: No Food Insecurity  . Worried About Charity fundraiser in the Last Year: Never true  . Ran Out of Food in the Last Year: Never true  Transportation Needs: No Transportation Needs  . Lack of Transportation (Medical): No  . Lack of Transportation (Non-Medical): No  Physical Activity: Sufficiently Active  . Days of Exercise per Week: 4 days  . Minutes of Exercise per Session: 40 min  Stress: Stress Concern Present  . Feeling of Stress : Rather much  Social Connections: Somewhat Isolated  . Frequency of Communication with Friends and Family: More than three times a week  . Frequency of Social Gatherings with Friends and Family: More than three times a week  . Attends Religious Services: Never  . Active Member of Clubs or Organizations: No  . Attends Archivist Meetings: Never  . Marital Status: Living with partner  Intimate Partner Violence: Not At Risk  . Fear of Current or Ex-Partner: No  .  Emotionally Abused: No  . Physically Abused: No  . Sexually Abused: No     Current Outpatient Medications:  .  Calcium 600-200 MG-UNIT tablet, Take 1 tablet by mouth daily., Disp: , Rfl:  .  diclofenac (FLECTOR) 1.3 % PTCH, Place 1 patch onto the skin 2 (two) times daily as needed. For pain, Disp: , Rfl:  .  esomeprazole (NEXIUM) 40 MG capsule, Take 1 capsule (40 mg total) by mouth 2 (two) times daily., Disp: 60 capsule, Rfl: 2 .  famotidine (PEPCID) 20 MG tablet, Take 1 tablet (20 mg total) by mouth 2 (two) times daily., Disp: 60 tablet, Rfl: 0 .  QUEtiapine (SEROQUEL) 25 MG tablet, TAKE 1 TABLET(25 MG) BY MOUTH AT BEDTIME, Disp: 90 tablet, Rfl: 3 .  Tiotropium Bromide-Olodaterol (STIOLTO RESPIMAT) 2.5-2.5 MCG/ACT AERS, Inhale 2 puffs into the lungs daily. In place of Anoro, Disp: 4 g, Rfl: 3 .  venlafaxine XR (EFFEXOR-XR) 150 MG 24 hr capsule, TAKE 1 CAPSULE(150 MG) BY MOUTH DAILY WITH  BREAKFAST, Disp: 90 capsule, Rfl: 0  Allergies  Allergen Reactions  . Aspirin Other (See Comments)    Causes heartburn even the chewable kind  . Clonidine Derivatives Itching    I personally reviewed active problem list, medication list, allergies, family history, social history, health maintenance with the patient/caregiver today.   ROS  Ten systems reviewed and is negative except as mentioned in HPI   Objective  Virtual encounter, vitals not obtained.  There is no height or weight on file to calculate BMI.  Physical Exam  Awake, alert and oriented   PHQ2/9: Depression screen [facility] 2/9 07/09/2019 05/26/2019 02/20/2019 05/20/2018 03/26/2018  Decreased Interest 2 0 0 0 0  Down, Depressed, Hopeless 2 0 0 0 0  PHQ - 2 Score 4 0 0 0 0  Altered sleeping 0 - 0 3 2  Tired, decreased energy 2 - 0 1 0  Change in appetite 0 - 0 0 0  Feeling bad or failure about yourself  0 - 0 0 0  Trouble concentrating 1 - 0 0 0  Moving slowly or fidgety/restless 2 - 0 0 0  Suicidal thoughts 0 - 0 0 0  PHQ-9 Score 9 - 0 4 2  Difficult doing work/chores Somewhat difficult - - Not difficult at all Not difficult at all   PHQ-2/9 Result is positive.  She is very worried about her daughter that has squamous cell carcinoma of tongue, mother also under chemo for liver mets - primary ovarian  Fall Risk: Fall Risk  07/09/2019 05/26/2019 02/20/2019 05/20/2018 03/26/2018  Falls in the past year? 0 0 0 0 0  Number falls in past yr: 0 0 0 0 0  Injury with Fall? 0 0 0 0 0  Risk for fall due to : - No Fall Risks - - -  Follow up Falls evaluation completed Falls prevention discussed - Falls prevention discussed Falls evaluation completed     Assessment & Plan  1. Moderate recurrent major depression (HCC)  - venlafaxine XR (EFFEXOR-XR) 150 MG 24 hr capsule; Take 1 capsule (150 mg total) by mouth daily with breakfast.  Dispense: 90 capsule; Refill: 0  2. Gastro-esophageal reflux disease without esophagitis  -  esomeprazole (NEXIUM) 40 MG capsule; Take 1 capsule (40 mg total) by mouth 2 (two) times daily.  Dispense: 180 capsule; Refill: 1  3. Insomnia, persistent  Doing well on seroquel   4. Purpura, nonthrombocytopenic (HCC)  Stable   5. Dyslipidemia  Needs to  return for labs   6. Centrilobular emphysema (HCC)  - Fluticasone-Umeclidin-Vilant (TRELEGY ELLIPTA) 100-62.5-25 MCG/INH AEPB; Inhale 1 puff into the lungs daily.  Dispense: 180 each; Refill: 1  7. Anxiety  - busPIRone (BUSPAR) 5 MG tablet; Take 1-2 tablets (5-10 mg total) by mouth 3 (three) times daily.  Dispense: 30 tablet; Refill: 0  She will call back if needs the 10 mg dose and how many pills she will take a month, if she does not like it we will switch to Hydroxyzine.   I discussed the assessment and treatment plan with the patient. The patient was provided an opportunity to ask questions and all were answered. The patient agreed with the plan and demonstrated an understanding of the instructions.   The patient was advised to call back or seek an in-person evaluation if the symptoms worsen or if the condition fails to improve as anticipated.  I provided 25  minutes of non-face-to-face time during this encounter.  Ruel Favors, MD

## 2019-07-10 ENCOUNTER — Ambulatory Visit: Payer: Medicare PPO | Admitting: Family Medicine

## 2019-10-15 ENCOUNTER — Telehealth: Payer: Self-pay | Admitting: Family Medicine

## 2019-10-15 NOTE — Telephone Encounter (Signed)
Copied from CRM [id number]. Topic: General - Inquiry >> Oct 12, 2019  9:26 AM Daphine Deutscher D wrote: Reason for CRM: Pt called having rt arm nerve pain and she would like to know if Dr. Carlynn Purl would call her in Lyrica.  She first ask for an appt but there was nothing available until 8/16.    Tara [name]  CB#  [phone] >> Oct 15, 2019  9:21 AM Daphine Deutscher D wrote: Pt called on Monday asking for if Dr. Carlynn Purl would send Lyrica to the pharmacy for her for nerve pain in her arm/  She has not heard anything back.  CB#  [phone]

## 2019-10-16 ENCOUNTER — Ambulatory Visit: Payer: Medicare PPO | Admitting: Internal Medicine

## 2019-10-16 ENCOUNTER — Other Ambulatory Visit: Payer: Self-pay

## 2019-10-16 ENCOUNTER — Encounter: Payer: Self-pay | Admitting: Internal Medicine

## 2019-10-16 VITALS — BP 120/70 | HR 94 | Temp 98.6°F | Resp 16 | Ht 69.0 in | Wt 168.1 lb

## 2019-10-16 DIAGNOSIS — G90511 Complex regional pain syndrome I of right upper limb: Secondary | ICD-10-CM

## 2019-10-16 DIAGNOSIS — F339 Major depressive disorder, recurrent, unspecified: Secondary | ICD-10-CM | POA: Diagnosis not present

## 2019-10-16 MED ORDER — PREGABALIN 25 MG PO CAPS: 25.0000 mg | ORAL_CAPSULE | Freq: Two times a day (BID) | ORAL | 2 refills | Status: DC

## 2019-10-16 NOTE — Telephone Encounter (Signed)
Called patient. She will make appointment to be seen. Transferred call to front desk.
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
## 2019-10-23 ENCOUNTER — Encounter: Payer: Self-pay | Admitting: Family Medicine

## 2019-10-23 ENCOUNTER — Telehealth: Payer: Self-pay

## 2019-10-23 NOTE — Telephone Encounter (Signed)
Copied from CRM [id number]. Topic: General - Other >> Oct 22, 2019  1:51 PM Mcneil, Ja-Kwan wrote: Reason for CRM: Pt stated she needs something a little stronger than the pregabalin (LYRICA) 25 MG capsule. Pt stated it is not helping with her pain. Pt stated she would like the Rx sent to [id number] [place], [place] - [address] >> Oct 23, 2019  9:02 AM Crist Infante wrote: Pt would like something stronger for her pain. She wants something before the weekend. Pt states she was just seen last week and does not need an appt to come in.

## 2019-10-23 NOTE — Telephone Encounter (Signed)
Called and spoke with patient. Lyrica is not helping her. I told her that your where in clinic and you would reach back out to her.

## 2019-10-23 NOTE — Telephone Encounter (Signed)
Patient called back to speak to Dr Carlynn Purl she became very emotional to the point of tears asking for some help. Would like a call back from Dr Carlynn Purl today please Ph# [phone]

## 2019-10-23 NOTE — Telephone Encounter (Signed)
Pt called back at 4:55 for status update, was unable to reach office.

## 2019-10-26 NOTE — Telephone Encounter (Signed)
lvm apologizing for Dr Carlynn Purl seeing the message late. Informed pt to schedule appt or go to urgent care.

## 2019-10-26 NOTE — Telephone Encounter (Signed)
Please call patient and have her come in for office visit for pain or go to urgent care.

## 2019-11-03 DIAGNOSIS — M545 Low back pain: Secondary | ICD-10-CM | POA: Diagnosis not present

## 2019-11-03 DIAGNOSIS — M79601 Pain in right arm: Secondary | ICD-10-CM | POA: Diagnosis not present

## 2019-11-03 DIAGNOSIS — M961 Postlaminectomy syndrome, not elsewhere classified: Secondary | ICD-10-CM | POA: Diagnosis not present

## 2019-11-03 DIAGNOSIS — M5136 Other intervertebral disc degeneration, lumbar region: Secondary | ICD-10-CM | POA: Diagnosis not present

## 2019-11-03 DIAGNOSIS — M5416 Radiculopathy, lumbar region: Secondary | ICD-10-CM | POA: Diagnosis not present

## 2019-11-05 ENCOUNTER — Emergency Department
Admission: EM | Admit: 2019-11-05 | Discharge: 2019-11-05 | Disposition: A | Discharge status: Home / Self Care | Payer: Medicare PPO | Attending: Emergency Medicine | Admitting: Emergency Medicine

## 2019-11-05 ENCOUNTER — Emergency Department: Payer: Medicare PPO

## 2019-11-05 ENCOUNTER — Other Ambulatory Visit: Payer: Self-pay

## 2019-11-05 DIAGNOSIS — J449 Chronic obstructive pulmonary disease, unspecified: Secondary | ICD-10-CM | POA: Diagnosis not present

## 2019-11-05 DIAGNOSIS — Z79899 Other long term (current) drug therapy: Secondary | ICD-10-CM | POA: Diagnosis not present

## 2019-11-05 DIAGNOSIS — Z90710 Acquired absence of both cervix and uterus: Secondary | ICD-10-CM | POA: Diagnosis not present

## 2019-11-05 DIAGNOSIS — N390 Urinary tract infection, site not specified: Secondary | ICD-10-CM | POA: Diagnosis not present

## 2019-11-05 DIAGNOSIS — M79601 Pain in right arm: Secondary | ICD-10-CM | POA: Insufficient documentation

## 2019-11-05 DIAGNOSIS — Z7951 Long term (current) use of inhaled steroids: Secondary | ICD-10-CM | POA: Insufficient documentation

## 2019-11-05 DIAGNOSIS — Z87891 Personal history of nicotine dependence: Secondary | ICD-10-CM | POA: Diagnosis not present

## 2019-11-05 DIAGNOSIS — R109 Unspecified abdominal pain: Secondary | ICD-10-CM | POA: Diagnosis not present

## 2019-11-05 LAB — CBC
HCT: 37.9 % (ref 36.0–46.0)
Hemoglobin: 12.8 g/dL (ref 12.0–15.0)
MCH: 31.5 pg (ref 26.0–34.0)
MCHC: 33.8 g/dL (ref 30.0–36.0)
MCV: 93.3 fL (ref 80.0–100.0)
Platelets: 325 10*3/uL (ref 150–400)
RBC: 4.06 MIL/uL (ref 3.87–5.11)
RDW: 12.6 % (ref 11.5–15.5)
WBC: 9 10*3/uL (ref 4.0–10.5)
nRBC: 0 % (ref 0.0–0.2)

## 2019-11-05 LAB — COMPREHENSIVE METABOLIC PANEL
ALT: 23 U/L (ref 0–44)
AST: 23 U/L (ref 15–41)
Albumin: 4.6 g/dL (ref 3.5–5.0)
Alkaline Phosphatase: 73 U/L (ref 38–126)
Anion gap: 10 (ref 5–15)
BUN: 16 mg/dL (ref 6–20)
CO2: 27 mmol/L (ref 22–32)
Calcium: 9.3 mg/dL (ref 8.9–10.3)
Chloride: 102 mmol/L (ref 98–111)
Creatinine, Ser: 0.88 mg/dL (ref 0.44–1.00)
GFR calc Af Amer: >60 mL/min (ref >60)
GFR calc non Af Amer: >60 mL/min (ref >60)
Glucose, Bld: 130 mg/dL — ABNORMAL HIGH (ref 70–99)
Potassium: 4.1 mmol/L (ref 3.5–5.1)
Sodium: 139 mmol/L (ref 135–145)
Total Bilirubin: 0.8 mg/dL (ref 0.3–1.2)
Total Protein: 8.3 g/dL — ABNORMAL HIGH (ref 6.5–8.1)

## 2019-11-05 LAB — URINALYSIS, COMPLETE (UACMP) WITH MICROSCOPIC
Bilirubin Urine: NEGATIVE
Glucose, UA: NEGATIVE mg/dL
Hgb urine dipstick: NEGATIVE
Ketones, ur: NEGATIVE mg/dL
Nitrite: NEGATIVE
Protein, ur: NEGATIVE mg/dL
Specific Gravity, Urine: 1.006 (ref 1.005–1.030)
pH: 6 (ref 5.0–8.0)

## 2019-11-05 MED ORDER — TRAMADOL HCL 50 MG PO TABS: 50.0000 mg | ORAL_TABLET | Freq: Four times a day (QID) | ORAL | 0 refills | Status: DC | PRN

## 2019-11-05 MED ORDER — CEPHALEXIN 500 MG PO CAPS: 500.0000 mg | ORAL_CAPSULE | Freq: Two times a day (BID) | ORAL | 0 refills | Status: DC

## 2019-11-05 MED ORDER — KETOROLAC TROMETHAMINE 30 MG/ML IJ SOLN
30.0000 mg | Freq: Once | INTRAMUSCULAR | Status: AC
  Administered 2019-11-05: 30 mg via INTRAMUSCULAR
  Filled 2019-11-05: qty 1

## 2019-11-05 NOTE — ED Notes (Signed)
See triage note  States she developed pain to right lower quad yesterday  States pain became worse last pm  Denies any fever or urinary sxs  Ambulates well

## 2019-11-05 NOTE — ED Provider Notes (Signed)
[facility] Emergency Department Provider Note   ____________________________________________    I have reviewed the triage vital signs and the nursing notes.   HISTORY  Chief Complaint Flank Pain     HPI Tara [name] is a 54 y.o. female with history as noted below who presents with complaints of right lower quadrant/right flank pain. Patient reports mild pain developed yesterday morning, worsened yesterday evening. She has never had pain like this before. No history of abdominal surgery reported. Mild nausea no vomiting. No hematuria. No dysuria. Does not take anything for this. No fevers chills. No sick contacts reported.  Past Medical History:  Diagnosis Date  . Anxiety   . Arthritis    arthritis in back  . Barrett's esophagus   . COPD (chronic obstructive pulmonary disease) (HCC)    uses spiriva  . CPRS 1 (complex regional pain syndrome I) of upper limb 2007   formerly called rsd  . Depression    being treated for depression  . GERD (gastroesophageal reflux disease)   . H/O degenerative disc disease    chronic back pain  . Headache(784.0)    headaches due to pain  . Neuromuscular disorder (HCC)    rsd  . Pneumonia    2011 - hospitalized for 4 days  . Pneumonia   . Reflex sympathetic dystrophy   . Tobacco abuse     Patient Active Problem List   Diagnosis Date Noted  . Complex regional pain syndrome type 1 of right upper extremity 10/16/2019  . Gastropathy 07/24/2017  . Purpura, nonthrombocytopenic (HCC) 05/17/2017  . Fatty liver 01/28/2015  . Barrett esophagus 09/10/2014  . Major depression, recurrent, chronic (HCC) 09/08/2014  . Insomnia, persistent 09/08/2014  . Arthralgia, sacroiliac 09/08/2014  . Degeneration of lumbar or lumbosacral intervertebral disc 09/08/2014  . Gastro-esophageal reflux disease without esophagitis 09/08/2014  . Perennial allergic rhinitis 09/08/2014  . Nerve root disorder 09/08/2014  . Reflex  sympathetic dystrophy 09/08/2014  . Retained orthopedic hardware 01/15/2012  . Chronic pain 01/25/2011    Past Surgical History:  Procedure Laterality Date  . ABDOMINAL HYSTERECTOMY  2005   partial  . CERVICAL DISC ARTHROPLASTY  2008  . ESOPHAGOGASTRODUODENOSCOPY (EGD) WITH PROPOFOL N/A 06/04/2017   Procedure: ESOPHAGOGASTRODUODENOSCOPY (EGD) WITH PROPOFOL;  Surgeon: Christena Deem, MD;  Location: [facility] ENDOSCOPY;  Service: Endoscopy;  Laterality: N/A;  . KNEE ARTHROSCOPY Right 2008  . LUMBAR LAMINECTOMY/DECOMPRESSION MICRODISCECTOMY  01/25/2011   Procedure: LUMBAR LAMINECTOMY/DECOMPRESSION MICRODISCECTOMY;  Surgeon: Alvy Beal;  Location: [facility] OR;  Service: Orthopedics;  Laterality: Left;  LEFT L5-S1 MICRODISCECTOMY, Central Decompression Lumbar five-sacral one  . OOPHORECTOMY Left 2011  . OOPHORECTOMY Right 10/2012  . SPINAL CORD STIMULATOR IMPLANT  2009   Removal-01/2012  . SPINAL CORD STIMULATOR REMOVAL  01/17/2012   Procedure: LUMBAR SPINAL CORD STIMULATOR REMOVAL;  Surgeon: Venita Lick, MD;  Location: [facility] OR;  Service: Orthopedics;  Laterality: N/A;  Spinal Cord Battery Removal  . TUBAL LIGATION  1988    Prior to Admission medications   Medication Sig Start Date End Date Taking? Authorizing Provider  Calcium 600-200 MG-UNIT tablet Take 1 tablet by mouth daily.    [provider]  cephALEXin (KEFLEX) 500 MG capsule Take 1 capsule (500 mg total) by mouth 2 (two) times daily. 11/05/19   Jene Every, MD  esomeprazole (NEXIUM) 40 MG capsule Take 1 capsule (40 mg total) by mouth 2 (two) times daily. 07/09/19   Alba Cory, MD  famotidine (PEPCID) 20  MG tablet Take 1 tablet (20 mg total) by mouth 2 (two) times daily. 02/20/19   Alba Cory, MD  Fluticasone-Umeclidin-Vilant (TRELEGY ELLIPTA) 100-62.5-25 MCG/INH AEPB Inhale 1 puff into the lungs daily. 07/09/19   Alba Cory, MD  pregabalin (LYRICA) 25 MG capsule Take 1 capsule (25 mg total) by mouth 2 (two)  times daily. 10/16/19   Jamelle Haring, MD  QUEtiapine (SEROQUEL) 25 MG tablet TAKE 1 TABLET(25 MG) BY MOUTH AT BEDTIME 02/20/19   Alba Cory, MD  traMADol (ULTRAM) 50 MG tablet Take 1 tablet (50 mg total) by mouth every 6 (six) hours as needed. 11/05/19 11/04/20  Jene Every, MD  venlafaxine XR (EFFEXOR-XR) 150 MG 24 hr capsule Take 1 capsule (150 mg total) by mouth daily with breakfast. 07/09/19   Alba Cory, MD     Allergies Aspirin and Clonidine derivatives  Family History  Problem Relation Age of Onset  . Depression Mother   . Ovarian cancer Mother   . Hypertension Mother   . Hypercholesterolemia Mother   . Obesity Mother   . Hypertension Sister   . Obesity Sister   . Stroke Sister   . Healthy Sister   . Healthy Daughter   . Tongue cancer Daughter   . Healthy Daughter   . Breast cancer Neg Hx     Social History Social History   Tobacco Use  . Smoking status: Former Smoker    Packs/day: 1.00    Years: 30.00    Pack years: 30.00    Types: Cigarettes    Start date: 09/10/1986    Quit date: 2018    Years since quitting: 3.6  . Smokeless tobacco: Never Used  Vaping Use  . Vaping Use: Some days  . Start date: 12/12/2016  . Substances: Nicotine  Substance Use Topics  . Alcohol use: No    Alcohol/week: 0.0 standard drinks  . Drug use: No    Review of Systems  Constitutional: No fever/chills Eyes: No visual changes.  ENT: No sore throat. Cardiovascular: Denies chest pain. Respiratory: Denies shortness of breath. Gastrointestinal: As above Genitourinary: As above Musculoskeletal: Negative for back pain. Skin: Negative for rash. Neurological: Negative for headaches   ____________________________________________   PHYSICAL EXAM:  VITAL SIGNS: ED Triage Vitals  Enc Vitals Group     BP 11/05/19 0153 (!) 156/73     Pulse Rate 11/05/19 0153 74     Resp 11/05/19 0153 20     Temp 11/05/19 0153 98.8 F (37.1 C)     Temp Source 11/05/19 0153  Oral     SpO2 11/05/19 0153 100 %     Weight 11/05/19 0154 76.2 kg (168 lb)     Height 11/05/19 0154 1.753 m (5\' 9" )     Head Circumference --      Peak Flow --      Pain Score 11/05/19 0818 3     Pain Loc --      Pain Edu? --      Excl. in GC? --     Constitutional: Alert and oriented.  Eyes: Conjunctivae are normal.   Nose: No congestion/rhinnorhea. Mouth/Throat: Mucous membranes are moist.    Cardiovascular: Normal rate, regular rhythm.   Good peripheral circulation. Respiratory: Normal respiratory effort.  No retractions. Lungs CTAB. Gastrointestinal: Mild tenderness over the right lower quadrant, soft. No distention.  No CVA tenderness. Genitourinary: deferred Musculoskeletal: Warm and well perfused Neurologic:  Normal speech and language. No gross focal neurologic deficits are appreciated.  Skin:  Skin is warm, dry and intact. No rash noted. Psychiatric: Mood and affect are normal. Speech and behavior are normal.  ____________________________________________   LABS (all labs ordered are listed, but only abnormal results are displayed)  Labs Reviewed  COMPREHENSIVE METABOLIC PANEL - Abnormal; Notable for the following components:      Result Value   Glucose, Bld 130 (*)    Total Protein 8.3 (*)    All other components within normal limits  URINALYSIS, COMPLETE (UACMP) WITH MICROSCOPIC - Abnormal; Notable for the following components:   Color, Urine STRAW (*)    APPearance CLEAR (*)    Leukocytes,Ua SMALL (*)    Bacteria, UA RARE (*)    All other components within normal limits  CBC   ____________________________________________  EKG  None ____________________________________________  RADIOLOGY  CT renal stone  ____________________________________________   PROCEDURES  Procedure(s) performed: No  Procedures   Critical Care performed: No ____________________________________________   INITIAL IMPRESSION / ASSESSMENT AND PLAN / ED  COURSE  Pertinent labs & imaging results that were available during my care of the patient were reviewed by me and considered in my medical decision making (see chart for details).  Patient presents with right lower quadrant/right flank pain as described above.  Differential includes ureterolithiasis, urinary tract infection, less likely appendicitis.  Lab work is reassuring, normal white blood cell count. Urinalysis overall reassuring, no hemoglobin noted.  Treated with IM Toradol, will obtain CT renal stone study to evaluate further.  CT scan demonstrates normal appendix and no evidence of ureterolithiasis.  Will Rx antibiotics given she is now complaining of dysuria.  Outpatient follow-up recommended    ____________________________________________   FINAL CLINICAL IMPRESSION(S) / ED DIAGNOSES  Final diagnoses:  Lower urinary tract infectious disease        Note:  This document was prepared using Dragon voice recognition software and may include unintentional dictation errors.   Jene Every, MD 11/05/19 1049

## 2019-11-05 NOTE — ED Triage Notes (Signed)
Pt in with co RLQ pain that started this am, radiates to back. Denies any hx of the same, no dysuria. No fever, no n.v.d.
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
## 2019-11-06 ENCOUNTER — Encounter: Payer: Self-pay | Admitting: Family Medicine

## 2019-11-06 ENCOUNTER — Ambulatory Visit: Payer: Medicare PPO | Admitting: Family Medicine

## 2019-11-06 VITALS — BP 110/80 | HR 87 | Temp 98.1°F | Resp 16 | Ht 69.0 in | Wt 166.2 lb

## 2019-11-06 DIAGNOSIS — D692 Other nonthrombocytopenic purpura: Secondary | ICD-10-CM

## 2019-11-06 DIAGNOSIS — K219 Gastro-esophageal reflux disease without esophagitis: Secondary | ICD-10-CM

## 2019-11-06 DIAGNOSIS — J449 Chronic obstructive pulmonary disease, unspecified: Secondary | ICD-10-CM | POA: Diagnosis not present

## 2019-11-06 DIAGNOSIS — J432 Centrilobular emphysema: Secondary | ICD-10-CM | POA: Diagnosis not present

## 2019-11-06 DIAGNOSIS — G90511 Complex regional pain syndrome I of right upper limb: Secondary | ICD-10-CM | POA: Diagnosis not present

## 2019-11-06 DIAGNOSIS — E785 Hyperlipidemia, unspecified: Secondary | ICD-10-CM

## 2019-11-06 DIAGNOSIS — F339 Major depressive disorder, recurrent, unspecified: Secondary | ICD-10-CM

## 2019-11-06 DIAGNOSIS — R739 Hyperglycemia, unspecified: Secondary | ICD-10-CM | POA: Diagnosis not present

## 2019-11-06 MED ORDER — ESOMEPRAZOLE MAGNESIUM 40 MG PO CPDR: 40.0000 mg | DELAYED_RELEASE_CAPSULE | Freq: Two times a day (BID) | ORAL | 1 refills | Status: DC

## 2019-11-06 MED ORDER — PREGABALIN 75 MG PO CAPS: 75.0000 mg | ORAL_CAPSULE | Freq: Three times a day (TID) | ORAL | 0 refills | Status: DC

## 2019-11-07 LAB — HEMOGLOBIN A1C
Hgb A1c MFr Bld: 5.6 % of total Hgb (ref <5.7)
Mean Plasma Glucose: 114 (calc)
eAG (mmol/L): 6.3 (calc)

## 2019-11-07 LAB — LIPID PANEL
Cholesterol: 266 mg/dL — ABNORMAL HIGH (ref <200)
HDL: 78 mg/dL (ref >=50)
LDL Cholesterol (Calc): 169 mg/dL (calc) — ABNORMAL HIGH
Non-HDL Cholesterol (Calc): 188 mg/dL (calc) — ABNORMAL HIGH (ref <130)
Total CHOL/HDL Ratio: 3.4 (calc) (ref <5.0)
Triglycerides: 83 mg/dL (ref <150)

## 2019-11-08 ENCOUNTER — Encounter: Payer: Self-pay | Admitting: Family Medicine

## 2019-11-24 DIAGNOSIS — H538 Other visual disturbances: Secondary | ICD-10-CM | POA: Diagnosis not present

## 2019-11-24 DIAGNOSIS — H04123 Dry eye syndrome of bilateral lacrimal glands: Secondary | ICD-10-CM | POA: Diagnosis not present

## 2019-11-24 DIAGNOSIS — H40023 Open angle with borderline findings, high risk, bilateral: Secondary | ICD-10-CM | POA: Diagnosis not present

## 2019-11-27 ENCOUNTER — Ambulatory Visit: Payer: Medicare PPO | Admitting: Family Medicine

## 2020-01-06 ENCOUNTER — Other Ambulatory Visit: Payer: Self-pay | Admitting: Family Medicine

## 2020-01-06 DIAGNOSIS — G4700 Insomnia, unspecified: Secondary | ICD-10-CM

## 2020-01-06 NOTE — Telephone Encounter (Signed)
Requested medication (s) are due for refill today: Yes  Requested medication (s) are on the active medication list: Yes  Last refill:  02/20/19  Future visit scheduled: Yes  Notes to clinic:  See request.    Requested Prescriptions  Pending Prescriptions Disp Refills   QUEtiapine (SEROQUEL) 25 MG tablet [Pharmacy Med Name: QUETIAPINE 25MG  TABLETS] 90 tablet 3    Sig: TAKE 1 TABLET(25 MG) BY MOUTH AT BEDTIME      Not Delegated - Psychiatry:  Antipsychotics - Second Generation (Atypical) - quetiapine Failed - 01/06/2020 11:30 AM      Failed - This refill cannot be delegated      Passed - ALT in normal range and within 180 days    ALT  Date Value Ref Range Status  11/05/2019 23 0 - 44 U/L Final   SGPT (ALT)  Date Value Ref Range Status  07/14/2012 23 12 - 78 U/L Final          Passed - AST in normal range and within 180 days    AST  Date Value Ref Range Status  11/05/2019 23 15 - 41 U/L Final   SGOT(AST)  Date Value Ref Range Status  07/14/2012 14 (L) 15 - 37 Unit/L Final          Passed - Completed PHQ-2 or PHQ-9 in the last 360 days.      Passed - Last BP in normal range    BP Readings from Last 1 Encounters:  11/06/19 110/80          Passed - Valid encounter within last 6 months    Recent Outpatient Visits           2 months ago Complex regional pain syndrome type 1 of right upper extremity   [facility], MD   2 months ago Complex regional pain syndrome type 1 of right upper extremity   [facility], MD   6 months ago Moderate recurrent major depression [facility])   [facility], MD   10 months ago Major depression, recurrent, chronic [facility])   [facility], MD   1 year ago Barrett's esophagus without dysplasia   [facility], Doren Custard       Future Appointments             In 2  months [place], [place] [facility]   In 4 months  [facility]

## 2020-01-11 ENCOUNTER — Other Ambulatory Visit: Payer: Self-pay | Admitting: Family Medicine

## 2020-01-11 DIAGNOSIS — K219 Gastro-esophageal reflux disease without esophagitis: Secondary | ICD-10-CM

## 2020-03-04 ENCOUNTER — Other Ambulatory Visit: Payer: Self-pay | Admitting: Family Medicine

## 2020-03-04 DIAGNOSIS — F331 Major depressive disorder, recurrent, moderate: Secondary | ICD-10-CM

## 2020-03-06 NOTE — Telephone Encounter (Signed)
Requested Prescriptions  Pending Prescriptions Disp Refills  . venlafaxine XR (EFFEXOR-XR) 150 MG 24 hr capsule [Pharmacy Med Name: VENLAFAXINE 150MG  ER CAPSULES] 90 capsule 0    Sig: TAKE 1 CAPSULE(150 MG) BY MOUTH DAILY WITH BREAKFAST     Psychiatry: Antidepressants - SNRI - desvenlafaxine & venlafaxine Failed - 03/04/2020  1:54 PM      Failed - LDL in normal range and within 360 days    LDL Cholesterol (Calc)  Date Value Ref Range Status  11/06/2019 169 (H) mg/dL (calc) Final    Comment:    Reference range: <100 . Desirable range <100 mg/dL for primary prevention;   <70 mg/dL for patients with CHD or diabetic patients  with > or = 2 CHD risk factors. 11/08/2019 LDL-C is now calculated using the Martin-Hopkins  calculation, which is a validated novel method providing  better accuracy than the Friedewald equation in the  estimation of LDL-C.  Marland Kitchen et al. Horald Pollen. Lenox Ahr): 2061-2068  (http://education.[facility].com/faq/FAQ164)          Failed - Total Cholesterol in normal range and within 360 days    Cholesterol, Total  Date Value Ref Range Status  01/18/2015 206 (H) 100 - 199 mg/dL Final   Cholesterol  Date Value Ref Range Status  11/06/2019 266 (H) <200 mg/dL Final         Passed - Triglycerides in normal range and within 360 days    Triglycerides  Date Value Ref Range Status  11/06/2019 83 <150 mg/dL Final         Passed - Completed PHQ-2 or PHQ-9 in the last 360 days      Passed - Last BP in normal range    BP Readings from Last 1 Encounters:  11/06/19 110/80         Passed - Valid encounter within last 6 months    Recent Outpatient Visits          4 months ago Complex regional pain syndrome type 1 of right upper extremity   [facility] [place], [place], MD   4 months ago Complex regional pain syndrome type 1 of right upper extremity   [facility] D, MD   8 months ago Moderate recurrent  major depression [facility])   [facility], MD   1 year ago Major depression, recurrent, chronic [facility])   [facility], MD   1 year ago Barrett's esophagus without dysplasia   [facility], Doren Custard      Future Appointments            In 1 week [place], MD [facility], [facility]   In 2 months  [facility], [facility]
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
## 2020-03-16 ENCOUNTER — Encounter: Payer: Self-pay | Admitting: Family Medicine

## 2020-03-16 ENCOUNTER — Other Ambulatory Visit: Payer: Self-pay

## 2020-03-16 ENCOUNTER — Ambulatory Visit: Payer: Medicare PPO | Admitting: Family Medicine

## 2020-03-16 VITALS — BP 110/84 | HR 77 | Temp 98.1°F | Resp 18 | Ht 69.0 in | Wt 170.9 lb

## 2020-03-16 DIAGNOSIS — K2270 Barrett's esophagus without dysplasia: Secondary | ICD-10-CM | POA: Diagnosis not present

## 2020-03-16 DIAGNOSIS — Z87891 Personal history of nicotine dependence: Secondary | ICD-10-CM

## 2020-03-16 DIAGNOSIS — F3340 Major depressive disorder, recurrent, in remission, unspecified: Secondary | ICD-10-CM

## 2020-03-16 DIAGNOSIS — D692 Other nonthrombocytopenic purpura: Secondary | ICD-10-CM

## 2020-03-16 DIAGNOSIS — J432 Centrilobular emphysema: Secondary | ICD-10-CM | POA: Diagnosis not present

## 2020-03-16 DIAGNOSIS — G4700 Insomnia, unspecified: Secondary | ICD-10-CM | POA: Diagnosis not present

## 2020-03-16 DIAGNOSIS — G90511 Complex regional pain syndrome I of right upper limb: Secondary | ICD-10-CM

## 2020-03-16 DIAGNOSIS — E785 Hyperlipidemia, unspecified: Secondary | ICD-10-CM | POA: Diagnosis not present

## 2020-03-16 DIAGNOSIS — F419 Anxiety disorder, unspecified: Secondary | ICD-10-CM | POA: Diagnosis not present

## 2020-03-16 MED ORDER — QUETIAPINE FUMARATE 25 MG PO TABS: 25.0000 mg | ORAL_TABLET | Freq: Every day | ORAL | 1 refills | Status: DC

## 2020-03-16 MED ORDER — TRELEGY ELLIPTA 100-62.5-25 MCG/INH IN AEPB: 1.0000 | INHALATION_SPRAY | Freq: Every day | RESPIRATORY_TRACT | 1 refills | Status: DC

## 2020-03-31 ENCOUNTER — Encounter: Payer: Self-pay | Admitting: Family Medicine

## 2020-04-04 ENCOUNTER — Telehealth: Payer: Self-pay | Admitting: *Deleted

## 2020-04-04 NOTE — Telephone Encounter (Signed)
Attempted to contact and schedule lung screening scan. Message left for patient to call back to schedule. 

## 2020-04-06 NOTE — Telephone Encounter (Signed)
Attempted to contact and schedule lung screening scan. Message left for patient to call back to schedule. 

## 2020-04-11 ENCOUNTER — Other Ambulatory Visit: Payer: Self-pay

## 2020-04-11 ENCOUNTER — Other Ambulatory Visit: Payer: Self-pay | Admitting: *Deleted

## 2020-04-11 DIAGNOSIS — Z87891 Personal history of nicotine dependence: Secondary | ICD-10-CM

## 2020-04-11 DIAGNOSIS — F331 Major depressive disorder, recurrent, moderate: Secondary | ICD-10-CM

## 2020-04-11 DIAGNOSIS — K219 Gastro-esophageal reflux disease without esophagitis: Secondary | ICD-10-CM

## 2020-04-11 DIAGNOSIS — Z122 Encounter for screening for malignant neoplasm of respiratory organs: Secondary | ICD-10-CM

## 2020-04-11 NOTE — Progress Notes (Signed)
Contacted and scheduled for annual lung screening scan. Patient is a former smoker, quit 03/12/16, 30 pack year history.

## 2020-04-18 ENCOUNTER — Other Ambulatory Visit: Payer: Self-pay

## 2020-04-18 ENCOUNTER — Ambulatory Visit
Admission: RE | Admit: 2020-04-18 | Discharge: 2020-04-18 | Disposition: A | Discharge status: Home / Self Care | Payer: Medicare PPO | Source: Ambulatory Visit | Attending: Oncology | Admitting: Oncology

## 2020-04-18 DIAGNOSIS — Z122 Encounter for screening for malignant neoplasm of respiratory organs: Secondary | ICD-10-CM | POA: Diagnosis not present

## 2020-04-18 DIAGNOSIS — Z87891 Personal history of nicotine dependence: Secondary | ICD-10-CM | POA: Insufficient documentation

## 2020-04-20 ENCOUNTER — Encounter: Payer: Self-pay | Admitting: *Deleted

## 2020-04-27 ENCOUNTER — Other Ambulatory Visit: Payer: Self-pay

## 2020-04-27 ENCOUNTER — Encounter: Payer: Self-pay | Admitting: Gastroenterology

## 2020-04-27 ENCOUNTER — Ambulatory Visit: Payer: Medicare PPO | Admitting: Gastroenterology

## 2020-04-27 VITALS — BP 137/65 | HR 72 | Temp 98.2°F | Ht 69.0 in | Wt 171.0 lb

## 2020-04-27 DIAGNOSIS — K219 Gastro-esophageal reflux disease without esophagitis: Secondary | ICD-10-CM | POA: Diagnosis not present

## 2020-04-27 NOTE — Progress Notes (Signed)
Arlyss Repress, MD [address]  [address]  [place], [place] [address]  Main: [phone]  Fax: [phone]    Gastroenterology Consultation  Referring Provider:     Alba Cory, MD Primary Care Physician:  Alba Cory, MD Primary Gastroenterologist:  Dr. Tobi Bastos Reason for Consultation:     ?  Barrett's esophagus, chronic GERD        HPI:   Tara [name] is a 55 y.o. female referred by Dr. Alba Cory, MD  for consultation & management of chronic GERD.  Patient has history of nonerosive esophagitis, on optimal dose PPI, Nexium 40 mg twice daily for long-term with questionable Barrett's esophagus.  Patient is also on Pepcid twice daily.  She does acknowledge that coffee is the trigger for her reflux.  She drinks up to 5 cups of coffee daily, cut back from 7 cups a day and loves it with creamer, sweetener.  She reports heartburn, acid taste in her mouth despite being on maximal acid suppressive therapy.  She had several EGDs in the past which did not reveal any erosive esophagitis.  She also went to [facility] to evaluate for antireflux surgery, which was delayed due to onset of pandemic.  Patient is not interested in antireflux surgery at this time.  She does not have any other concerns today.  She quit tobacco use, she does not drink alcohol NSAIDs: None  Antiplts/Anticoagulants/Anti thrombotics: None  GI Procedures:  Colonoscopy 04/21/2014 normal Upper endoscopy 04/21/2014 DIAGNOSIS:  GASTROESOPHAGEAL JUNCTION; MUCOSAL BIOPSIES:  - ACTIVE ESOPHAGITIS, MILD TO MODERATE CONSISTENT WITH GASTROESOPHAGEAL  REFLUX DISEASE.  - FOCAL INTESTINAL (GOBLET CELL) METAPLASIA.  - NO DYSPLASIA SEEN.  - SEE COMMENT.   Comment:  If the focus of intestinal metaplasia was obtained from above the  anatomic gastroesophageal junction, the features are consistent with  Barrett's mucosa.  Upper endoscopy 06/04/2017 DIAGNOSIS:  A. STOMACH, RANDOM ANTRUM AND BODY; COLD BIOPSY:  -  FEATURES OF REACTIVE GASTROPATHY, SEE NOTE.  - NEGATIVE FOR H. PYLORI, DYSPLASIA AND MALIGNANCY.   Note:  The differential diagnosis for findings in the stomach includes  drugs/chemical injury (NSAIDs vs. other), bile reflux, and changes  adjacent to an area of healing ulceration. Clinical correlation with  endoscopic findings is required.   B. GE JUNCTION; COLD BIOPSY:  - REFLUX GASTROESOPHAGITIS AND MULTILAYERED EPITHELIUM.  - NEGATIVE FOR GOBLET CELLS, DYSPLASIA AND MALIGNANCY.  - DEEPER LEVELS WERE EXAMINED.   Past Medical History:  Diagnosis Date  . Anxiety   . Arthritis    arthritis in back  . Barrett's esophagus   . COPD (chronic obstructive pulmonary disease) (HCC)    uses spiriva  . CPRS 1 (complex regional pain syndrome I) of upper limb 2007   formerly called rsd  . Depression    being treated for depression  . GERD (gastroesophageal reflux disease)   . H/O degenerative disc disease    chronic back pain  . Headache(784.0)    headaches due to pain  . Neuromuscular disorder (HCC)    rsd  . Pneumonia    2011 - hospitalized for 4 days  . Pneumonia   . Reflex sympathetic dystrophy   . Tobacco abuse     Past Surgical History:  Procedure Laterality Date  . ABDOMINAL HYSTERECTOMY  2005   partial  . CERVICAL DISC ARTHROPLASTY  2008  . ESOPHAGOGASTRODUODENOSCOPY (EGD) WITH PROPOFOL N/A 06/04/2017   Procedure: ESOPHAGOGASTRODUODENOSCOPY (EGD) WITH PROPOFOL;  Surgeon: Christena Deem, MD;  Location: [facility] ENDOSCOPY;  Service: Endoscopy;  Laterality: N/A;  . KNEE ARTHROSCOPY Right 2008  . LUMBAR LAMINECTOMY/DECOMPRESSION MICRODISCECTOMY  01/25/2011   Procedure: LUMBAR LAMINECTOMY/DECOMPRESSION MICRODISCECTOMY;  Surgeon: Alvy Beal;  Location: [facility] OR;  Service: Orthopedics;  Laterality: Left;  LEFT L5-S1 MICRODISCECTOMY, Central Decompression Lumbar five-sacral one  . OOPHORECTOMY Left 2011  . OOPHORECTOMY Right 10/2012  . SPINAL CORD STIMULATOR IMPLANT  2009    Removal-01/2012  . SPINAL CORD STIMULATOR REMOVAL  01/17/2012   Procedure: LUMBAR SPINAL CORD STIMULATOR REMOVAL;  Surgeon: Venita Lick, MD;  Location: [facility] OR;  Service: Orthopedics;  Laterality: N/A;  Spinal Cord Battery Removal  . TUBAL LIGATION  1988    Current Outpatient Medications:  .  Calcium 600-200 MG-UNIT tablet, Take 1 tablet by mouth daily., Disp: , Rfl:  .  esomeprazole (NEXIUM) 40 MG capsule, TAKE 1 CAPSULE BY MOUTH TWICE DAILY, Disp: 180 capsule, Rfl: 1 .  famotidine (PEPCID) 20 MG tablet, Take 1 tablet (20 mg total) by mouth 2 (two) times daily., Disp: 60 tablet, Rfl: 0 .  Fluticasone-Umeclidin-Vilant (TRELEGY ELLIPTA) 100-62.5-25 MCG/INH AEPB, Inhale 1 puff into the lungs daily., Disp: 180 each, Rfl: 1 .  pregabalin (LYRICA) 75 MG capsule, Take 1 capsule (75 mg total) by mouth 3 (three) times daily., Disp: 270 capsule, Rfl: 0 .  QUEtiapine (SEROQUEL) 25 MG tablet, Take 1 tablet (25 mg total) by mouth at bedtime., Disp: 90 tablet, Rfl: 1 .  venlafaxine XR (EFFEXOR-XR) 150 MG 24 hr capsule, TAKE 1 CAPSULE(150 MG) BY MOUTH DAILY WITH BREAKFAST, Disp: 90 capsule, Rfl: 0   Family History  Problem Relation Age of Onset  . Depression Mother   . Ovarian cancer Mother   . Hypertension Mother   . Hypercholesterolemia Mother   . Obesity Mother   . Hypertension Sister   . Obesity Sister   . Stroke Sister   . Healthy Sister   . Healthy Daughter   . Tongue cancer Daughter   . Healthy Daughter   . Breast cancer Neg Hx      Social History   Tobacco Use  . Smoking status: Former Smoker    Packs/day: 1.00    Years: 30.00    Pack years: 30.00    Types: Cigarettes    Start date: 09/10/1986    Quit date: 2018    Years since quitting: 4.1  . Smokeless tobacco: Never Used  Vaping Use  . Vaping Use: Some days  . Start date: 12/12/2016  . Substances: Nicotine  Substance Use Topics  . Alcohol use: No    Alcohol/week: 0.0 standard drinks  . Drug use: No    Allergies as of  04/27/2020 - Review Complete 04/27/2020  Allergen Reaction Noted  . Aspirin Other (See Comments) 01/17/2016  . Clonidine derivatives Itching 01/22/2011    Review of Systems:    All systems reviewed and negative except where noted in HPI.   Physical Exam:  BP 137/65 (BP Location: Left Arm, Patient Position: Sitting, Cuff Size: Normal)   Pulse 72   Temp 98.2 F (36.8 C) (Oral)   Ht 5\' 9"  (1.753 m)   Wt 171 lb (77.6 kg)   BMI 25.25 kg/m  No LMP recorded. Patient has had a hysterectomy.  General:   Alert,  Well-developed, well-nourished, pleasant and cooperative in NAD Head:  Normocephalic and atraumatic. Eyes:  Sclera clear, no icterus.   Conjunctiva pink. Ears:  Normal auditory acuity. Nose:  No deformity, discharge, or lesions. Mouth:  No deformity or lesions,oropharynx pink &  moist. Neck:  Supple; no masses or thyromegaly. Lungs:  Respirations even and unlabored.  Clear throughout to auscultation.   No wheezes, crackles, or rhonchi. No acute distress. Heart:  Regular rate and rhythm; no murmurs, clicks, rubs, or gallops. Abdomen:  Normal bowel sounds. Soft, non-tender and non-distended without masses, hepatosplenomegaly or hernias noted.  No guarding or rebound tenderness.   Rectal: Not performed Msk:  Symmetrical without gross deformities. Good, equal movement & strength bilaterally. Pulses:  Normal pulses noted. Extremities:  No clubbing or edema.  No cyanosis. Neurologic:  Alert and oriented x3;  grossly normal neurologically. Skin:  Intact without significant lesions or rashes. No jaundice. Psych:  Alert and cooperative. Normal mood and affect.  Imaging Studies: No abdominal imaging  Assessment and Plan:   Tara [name] is a 55 y.o. female with chronic nonerosive GERD, on maximal dose PPI, triggered by coffee.  Patient had upper endoscopy in 2019 with no evidence of Barrett's esophagus.  She had an endoscopy in 2016 with one focus of intestinal metaplasia in setting  of esophagitis.  We can repeat upper endoscopy again to screen for Barrett's esophagus given her chronic GERD I have discussed again to cut back on coffee and she said she will try even though it is very hard I have also reviewed the long-term effects of chronic acid suppression and patient acknowledged   Follow up as needed  Arlyss Repress, MD

## 2020-05-12 ENCOUNTER — Other Ambulatory Visit
Admission: RE | Admit: 2020-05-12 | Discharge: 2020-05-12 | Disposition: A | Discharge status: Home / Self Care | Payer: Medicare PPO | Source: Ambulatory Visit | Attending: Gastroenterology | Admitting: Gastroenterology

## 2020-05-12 ENCOUNTER — Other Ambulatory Visit: Payer: Self-pay

## 2020-05-12 DIAGNOSIS — Z20822 Contact with and (suspected) exposure to covid-19: Secondary | ICD-10-CM | POA: Diagnosis not present

## 2020-05-12 DIAGNOSIS — Z01812 Encounter for preprocedural laboratory examination: Secondary | ICD-10-CM | POA: Diagnosis not present

## 2020-05-12 LAB — SARS CORONAVIRUS 2 (TAT 6-24 HRS): SARS Coronavirus 2: NEGATIVE

## 2020-05-16 ENCOUNTER — Ambulatory Visit: Admission: RE | Admit: 2020-05-16 | Payer: Medicare PPO | Source: Home / Self Care | Admitting: Gastroenterology

## 2020-05-16 ENCOUNTER — Encounter: Payer: Self-pay | Admitting: Certified Registered Nurse Anesthetist

## 2020-05-16 ENCOUNTER — Encounter: Admission: RE | Payer: Self-pay | Source: Home / Self Care

## 2020-05-16 SURGERY — ESOPHAGOGASTRODUODENOSCOPY (EGD) WITH PROPOFOL
Anesthesia: General

## 2020-05-26 ENCOUNTER — Ambulatory Visit: Payer: Medicare PPO

## 2020-05-31 ENCOUNTER — Ambulatory Visit (INDEPENDENT_AMBULATORY_CARE_PROVIDER_SITE_OTHER): Payer: Medicare PPO

## 2020-05-31 VITALS — Ht 69.0 in | Wt 166.0 lb

## 2020-05-31 DIAGNOSIS — Z1231 Encounter for screening mammogram for malignant neoplasm of breast: Secondary | ICD-10-CM

## 2020-05-31 DIAGNOSIS — Z0000 Encounter for general adult medical examination without abnormal findings: Secondary | ICD-10-CM

## 2020-05-31 NOTE — Patient Instructions (Signed)
Tara [name] , Thank you for taking time to come for your Medicare Wellness Visit. I appreciate your ongoing commitment to your health goals. Please review the following plan we discussed and let me know if I can assist you in the future.   Screening recommendations/referrals: Colonoscopy: Done 04/22/2014 Repeat in 2026  Mammogram: Done 04/14/2019 Bone Density: Done 04/08/2019  Recommended yearly ophthalmology/optometry visit for glaucoma screening and checkup Recommended yearly dental visit for hygiene and checkup  Vaccinations: Influenza vaccine: unsure - maybe 10/2019 - will check chart Pneumococcal vaccine: 07/26/2015 Tdap vaccine: 10/10/2011 Shingles vaccine: Due Shingrix discussed. Please contact your pharmacy for coverage information.    Covid-19: Complete 06/01/19, 06/29/19, 02/02/20  Advanced directives: Advance directive discussed with you today. Even though you declined this today please call our office should you change your mind and we can give you the proper paperwork for you to fill out.   Conditions/risks identified: Keep up the great work!   Next appointment: Follow up in one year for your annual wellness visit.   Preventive Care 40-64 Years, Female Preventive care refers to lifestyle choices and visits with your health care provider that can promote health and wellness. What does preventive care include?  A yearly physical exam. This is also called an annual well check.  Dental exams once or twice a year.  Routine eye exams. Ask your health care provider how often you should have your eyes checked.  Personal lifestyle choices, including:  Daily care of your teeth and gums.  Regular physical activity.  Eating a healthy diet.  Avoiding tobacco and drug use.  Limiting alcohol use.  Practicing safe sex.  Taking low-dose aspirin daily starting at age 56.  Taking vitamin and mineral supplements as recommended by your health care provider. What happens  during an annual well check? The services and screenings done by your health care provider during your annual well check will depend on your age, overall health, lifestyle risk factors, and family history of disease. Counseling  Your health care provider may ask you questions about your:  Alcohol use.  Tobacco use.  Drug use.  Emotional well-being.  Home and relationship well-being.  Sexual activity.  Eating habits.  Work and work Statistician.  Method of birth control.  Menstrual cycle.  Pregnancy history. Screening  You may have the following tests or measurements:  Height, weight, and BMI.  Blood pressure.  Lipid and cholesterol levels. These may be checked every 5 years, or more frequently if you are over 59 years old.  Skin check.  Lung cancer screening. You may have this screening every year starting at age 6 if you have a 30-pack-year history of smoking and currently smoke or have quit within the past 15 years.  Fecal occult blood test (FOBT) of the stool. You may have this test every year starting at age 55.  Flexible sigmoidoscopy or colonoscopy. You may have a sigmoidoscopy every 5 years or a colonoscopy every 10 years starting at age 52.  Hepatitis C blood test.  Hepatitis B blood test.  Sexually transmitted disease (STD) testing.  Diabetes screening. This is done by checking your blood sugar (glucose) after you have not eaten for a while (fasting). You may have this done every 1-3 years.  Mammogram. This may be done every 1-2 years. Talk to your health care provider about when you should start having regular mammograms. This may depend on whether you have a family history of breast cancer.  BRCA-related cancer screening. This may  be done if you have a family history of breast, ovarian, tubal, or peritoneal cancers.  Pelvic exam and Pap test. This may be done every 3 years starting at age 81. Starting at age 32, this may be done every 5 years if you  have a Pap test in combination with an HPV test.  Bone density scan. This is done to screen for osteoporosis. You may have this scan if you are at high risk for osteoporosis. Discuss your test results, treatment options, and if necessary, the need for more tests with your health care provider. Vaccines  Your health care provider may recommend certain vaccines, such as:  Influenza vaccine. This is recommended every year.  Tetanus, diphtheria, and acellular pertussis (Tdap, Td) vaccine. You may need a Td booster every 10 years.  Zoster vaccine. You may need this after age 36.  Pneumococcal 13-valent conjugate (PCV13) vaccine. You may need this if you have certain conditions and were not previously vaccinated.  Pneumococcal polysaccharide (PPSV23) vaccine. You may need one or two doses if you smoke cigarettes or if you have certain conditions. Talk to your health care provider about which screenings and vaccines you need and how often you need them. This information is not intended to replace advice given to you by your health care provider. Make sure you discuss any questions you have with your health care provider. Document Released: 03/25/2015 Document Revised: 11/16/2015 Document Reviewed: 12/28/2014 Elsevier Interactive Patient Education  2017 [employer] Prevention in the Home Falls can cause injuries. They can happen to people of all ages. There are many things you can do to make your home safe and to help prevent falls. What can I do on the outside of my home?  Regularly fix the edges of walkways and driveways and fix any cracks.  Remove anything that might make you trip as you walk through a door, such as a raised step or threshold.  Trim any bushes or trees on the path to your home.  Use bright outdoor lighting.  Clear any walking paths of anything that might make someone trip, such as rocks or tools.  Regularly check to see if handrails are loose or broken.  Make sure that both sides of any steps have handrails.  Any raised decks and porches should have guardrails on the edges.  Have any leaves, snow, or ice cleared regularly.  Use sand or salt on walking paths during winter.  Clean up any spills in your garage right away. This includes oil or grease spills. What can I do in the bathroom?  Use night lights.  Install grab bars by the toilet and in the tub and shower. Do not use towel bars as grab bars.  Use non-skid mats or decals in the tub or shower.  If you need to sit down in the shower, use a plastic, non-slip stool.  Keep the floor dry. Clean up any water that spills on the floor as soon as it happens.  Remove soap buildup in the tub or shower regularly.  Attach bath mats securely with double-sided non-slip rug tape.  Do not have throw rugs and other things on the floor that can make you trip. What can I do in the bedroom?  Use night lights.  Make sure that you have a light by your bed that is easy to reach.  Do not use any sheets or blankets that are too big for your bed. They should not hang down onto  the floor.  Have a firm chair that has side arms. You can use this for support while you get dressed.  Do not have throw rugs and other things on the floor that can make you trip. What can I do in the kitchen?  Clean up any spills right away.  Avoid walking on wet floors.  Keep items that you use a lot in easy-to-reach places.  If you need to reach something above you, use a strong step stool that has a grab bar.  Keep electrical cords out of the way.  Do not use floor polish or wax that makes floors slippery. If you must use wax, use non-skid floor wax.  Do not have throw rugs and other things on the floor that can make you trip. What can I do with my stairs?  Do not leave any items on the stairs.  Make sure that there are handrails on both sides of the stairs and use them. Fix handrails that are broken or  loose. Make sure that handrails are as long as the stairways.  Check any carpeting to make sure that it is firmly attached to the stairs. Fix any carpet that is loose or worn.  Avoid having throw rugs at the top or bottom of the stairs. If you do have throw rugs, attach them to the floor with carpet tape.  Make sure that you have a light switch at the top of the stairs and the bottom of the stairs. If you do not have them, ask someone to add them for you. What else can I do to help prevent falls?  Wear shoes that:  Do not have high heels.  Have rubber bottoms.  Are comfortable and fit you well.  Are closed at the toe. Do not wear sandals.  If you use a stepladder:  Make sure that it is fully opened. Do not climb a closed stepladder.  Make sure that both sides of the stepladder are locked into place.  Ask someone to hold it for you, if possible.  Clearly mark and make sure that you can see:  Any grab bars or handrails.  First and last steps.  Where the edge of each step is.  Use tools that help you move around (mobility aids) if they are needed. These include:  Canes.  Walkers.  Scooters.  Crutches.  Turn on the lights when you go into a dark area. Replace any light bulbs as soon as they burn out.  Set up your furniture so you have a clear path. Avoid moving your furniture around.  If any of your floors are uneven, fix them.  If there are any pets around you, be aware of where they are.  Review your medicines with your doctor. Some medicines can make you feel dizzy. This can increase your chance of falling. Ask your doctor what other things that you can do to help prevent falls. This information is not intended to replace advice given to you by your health care provider. Make sure you discuss any questions you have with your health care provider. Document Released: 12/23/2008 Document Revised: 08/04/2015 Document Reviewed: 04/02/2014 Elsevier Interactive  Patient Education  2017 [employer].

## 2020-05-31 NOTE — Progress Notes (Signed)
Subjective:   Tara [name] is a 55 y.o. female who presents for Medicare Annual (Subsequent) preventive examination.  Virtual Visit via Telephone Note  I connected with  Tara [name] on 05/31/20 at 10:00 AM EDT by telephone and verified that I am speaking with the correct person using two identifiers.  Location: Patient: Home Provider: [facility] Persons participating in the virtual visit: patient/Nurse Health Advisor   I discussed the limitations, risks, security and privacy concerns of performing an evaluation and management service by telephone and the availability of in person appointments. The patient expressed understanding and agreed to proceed.  Interactive audio and video telecommunications were attempted between this nurse and patient, however failed, due to patient having technical difficulties OR patient did not have access to video capability.  We continued and completed visit with audio only.  Some vital signs may be absent or patient reported.   Reather Littler, LPN    Review of Systems     Cardiac Risk Factors include: none     Objective:    Today's Vitals   05/31/20 1002 05/31/20 1003  Weight: 166 lb (75.3 kg)   Height: 5\' 9"  (1.753 m)   PainSc:  7    Body mass index is 24.51 kg/m.  Advanced Directives 05/31/2020 11/05/2019 05/26/2019 05/20/2018 06/04/2017 05/17/2017 11/28/2016  Does Patient Have a Medical Advance Directive? No No No No No No No  Would patient like information on creating a medical advance directive? No - Patient declined - Yes (MAU/Ambulatory/Procedural Areas - Information given) No - Patient declined No - Patient declined Yes (MAU/Ambulatory/Procedural Areas - Information given) -  Pre-existing out of facility DNR order (yellow form or pink MOST form) - - - - - - -    Current Medications (verified) Outpatient Encounter Medications as of 05/31/2020  Medication Sig  . Calcium 600-200 MG-UNIT tablet Take 1 tablet by mouth daily.  06/02/2020  esomeprazole (NEXIUM) 40 MG capsule TAKE 1 CAPSULE BY MOUTH TWICE DAILY  . famotidine (PEPCID) 20 MG tablet Take 1 tablet (20 mg total) by mouth 2 (two) times daily.  . Fluticasone-Umeclidin-Vilant (TRELEGY ELLIPTA) 100-62.5-25 MCG/INH AEPB Inhale 1 puff into the lungs daily.  . pregabalin (LYRICA) 75 MG capsule Take 1 capsule (75 mg total) by mouth 3 (three) times daily.  . QUEtiapine (SEROQUEL) 25 MG tablet Take 1 tablet (25 mg total) by mouth at bedtime.  04-10-1985 venlafaxine XR (EFFEXOR-XR) 150 MG 24 hr capsule TAKE 1 CAPSULE(150 MG) BY MOUTH DAILY WITH BREAKFAST   No facility-administered encounter medications on file as of 05/31/2020.    Allergies (verified) Aspirin and Clonidine derivatives   History: Past Medical History:  Diagnosis Date  . Anxiety   . Arthritis    arthritis in back  . Barrett's esophagus   . COPD (chronic obstructive pulmonary disease) (HCC)    uses spiriva  . CPRS 1 (complex regional pain syndrome I) of upper limb 2007   formerly called rsd  . Depression    being treated for depression  . GERD (gastroesophageal reflux disease)   . H/O degenerative disc disease    chronic back pain  . Headache(784.0)    headaches due to pain  . Neuromuscular disorder (HCC)    rsd  . Pneumonia    2011 - hospitalized for 4 days  . Pneumonia   . Reflex sympathetic dystrophy   . Tobacco abuse    Past Surgical History:  Procedure Laterality Date  . ABDOMINAL HYSTERECTOMY  2005  partial  . CERVICAL DISC ARTHROPLASTY  2008  . ESOPHAGOGASTRODUODENOSCOPY (EGD) WITH PROPOFOL N/A 06/04/2017   Procedure: ESOPHAGOGASTRODUODENOSCOPY (EGD) WITH PROPOFOL;  Surgeon: Christena DeemSkulskie, Martin U, MD;  [facility];  Service: Endoscopy;  Laterality: N/A;  . KNEE ARTHROSCOPY Right 2008  . LUMBAR LAMINECTOMY/DECOMPRESSION MICRODISCECTOMY  01/25/2011   Procedure: LUMBAR LAMINECTOMY/DECOMPRESSION MICRODISCECTOMY;  Surgeon: Alvy Bealahari D Brooks;  Location: [facility] OR;  Service: Orthopedics;   Laterality: Left;  LEFT L5-S1 MICRODISCECTOMY, Central Decompression Lumbar five-sacral one  . OOPHORECTOMY Left 2011  . OOPHORECTOMY Right 10/2012  . SPINAL CORD STIMULATOR IMPLANT  2009   Removal-01/2012  . SPINAL CORD STIMULATOR REMOVAL  01/17/2012   Procedure: LUMBAR SPINAL CORD STIMULATOR REMOVAL;  Surgeon: Venita Lickahari Brooks, MD;  Location: [facility] OR;  Service: Orthopedics;  Laterality: N/A;  Spinal Cord Battery Removal  . TUBAL LIGATION  1988   Family History  Problem Relation Age of Onset  . Depression Mother   . Ovarian cancer Mother   . Hypertension Mother   . Hypercholesterolemia Mother   . Obesity Mother   . Hypertension Sister   . Obesity Sister   . Stroke Sister   . Healthy Sister   . Healthy Daughter   . Tongue cancer Daughter   . Healthy Daughter   . Breast cancer Neg Hx    Social History   Socioeconomic History  . Marital status: Divorced    Spouse name: Not on file  . Number of children: 2  . Years of education: Not on file  . Highest education level: 12th grade  Occupational History    Employer: UNEMPLOYED    Employer: DISABLED  Tobacco Use  . Smoking status: Former Smoker    Packs/day: 1.00    Years: 30.00    Pack years: 30.00    Types: Cigarettes    Start date: 09/10/1986    Quit date: 2018    Years since quitting: 4.2  . Smokeless tobacco: Never Used  Vaping Use  . Vaping Use: Some days  . Start date: 12/12/2016  . Substances: Nicotine  Substance and Sexual Activity  . Alcohol use: No    Alcohol/week: 0.0 standard drinks  . Drug use: No  . Sexual activity: Yes    Partners: Male    Birth control/protection: Post-menopausal  Other Topics Concern  . Not on file  Social History Narrative   Lives with long term boyfriend   Social Determinants of Health   Financial Resource Strain: Low Risk   . Difficulty of Paying Living Expenses: Not hard at all  Food Insecurity: No Food Insecurity  . Worried About Programme researcher, broadcasting/film/videounning Out of Food in the Last Year: Never  true  . Ran Out of Food in the Last Year: Never true  Transportation Needs: No Transportation Needs  . Lack of Transportation (Medical): No  . Lack of Transportation (Non-Medical): No  Physical Activity: Sufficiently Active  . Days of Exercise per Week: 4 days  . Minutes of Exercise per Session: 50 min  Stress: No Stress Concern Present  . Feeling of Stress : Only a little  Social Connections: Socially Isolated  . Frequency of Communication with Friends and Family: More than three times a week  . Frequency of Social Gatherings with Friends and Family: More than three times a week  . Attends Religious Services: Never  . Active Member of Clubs or Organizations: No  . Attends BankerClub or Organization Meetings: Never  . Marital Status: Divorced    Tobacco Counseling Counseling given: Not Answered  Clinical Intake:  Pre-visit preparation completed: Yes  Pain : 0-10 Pain Score: 7  Pain Type: Acute pain Pain Location: Back Pain Descriptors / Indicators: Aching Pain Onset: More than a month ago Pain Frequency: Intermittent Pain Relieving Factors: heat, voltaren  Pain Relieving Factors: heat, voltaren  BMI - recorded: 24.51 Nutritional Status: BMI of 19-24  Normal Nutritional Risks: None Diabetes: No  How often do you need to have someone help you when you read instructions, pamphlets, or other written materials from your doctor or pharmacy?: 1 - Never   Interpreter Needed?: No  Information entered by :: Amy Hopkins, LPN   Activities of Daily Living In your present state of health, do you have any difficulty performing the following activities: 05/31/2020 03/16/2020  Hearing? N N  Vision? N N  Difficulty concentrating or making decisions? N N  Walking or climbing stairs? N N  Dressing or bathing? N N  Doing errands, shopping? N N  Preparing Food and eating ? N -  Using the Toilet? N -  In the past six months, have you accidently leaked urine? N -  Do you have problems  with loss of bowel control? N -  Managing your Medications? N -  Managing your Finances? N -  Housekeeping or managing your Housekeeping? N -  Some recent data might be hidden    Patient Care Team: Alba Cory, MD as PCP - General Wyline Mood, MD as Consulting Physician (Gastroenterology)  Indicate any recent Medical Services you may have received from other than [facility] providers in the past year (date may be approximate).     Assessment:   This is a routine wellness examination for Tara [name].  Hearing/Vision screen  Hearing Screening             Right ear:           Left ear:           Comments: Pt denies hearing difficulty  Vision Screening Comments: No vision complaints - saw Dr Clydene Pugh last year   Dietary issues and exercise activities discussed: Current Exercise Habits: Home exercise routine, Time (Minutes): 40, Frequency (Times/Week): 4, Weekly Exercise (Minutes/Week): 160, Intensity: Mild, Exercise limited by: orthopedic condition(s)  Goals    . DIET - INCREASE WATER INTAKE     Recommend to drink at least 6-8 8oz glasses of water per day.    . Weight (lb) < 155 lb (70.3 kg)     Pt states she would like to lose 15 pounds over the next year      Depression Screen PHQ 2/9 Scores 05/31/2020 03/16/2020 03/16/2020 11/06/2019 10/16/2019 07/09/2019 05/26/2019  PHQ - 2 Score 0 0 0 1 0 4 0  PHQ- 9 Score - 1 - 4 0 9 -    Fall Risk Fall Risk  05/31/2020 03/16/2020 11/06/2019 10/16/2019 07/09/2019  Falls in the past year? 0 0 0 0 0  Number falls in past yr: 0 0 0 0 0  Injury with Fall? 0 0 0 0 0  Risk for fall due to : No Fall Risks - - - -  Follow up Falls prevention discussed - - - Falls evaluation completed    FALL RISK PREVENTION PERTAINING TO THE HOME:  Any stairs in or around the home? No  If so, are there any without handrails? No  Home free of loose throw rugs in walkways, pet beds, electrical cords, etc? Yes  Adequate  lighting in your home to reduce risk of falls?  Yes   ASSISTIVE DEVICES UTILIZED TO PREVENT FALLS:  Life alert? No  Use of a cane, walker or w/c? No  Grab bars in the bathroom? Yes  Shower chair or bench in shower? No  Elevated toilet seat or a handicapped toilet? Yes   TIMED UP AND GO:  Was the test performed? No .  Telephonic visit.  Cognitive Function: Normal cognitive status assessed by direct observation by this Nurse Health Advisor. No abnormalities found.       6CIT Screen 05/20/2018 05/17/2017  What Year? 0 points 0 points  What month? 0 points 0 points  What time? 0 points 0 points  Count back from 20 0 points 0 points  Months in reverse 0 points 0 points  Repeat phrase 0 points 0 points  Total Score 0 0    Immunizations Immunization History  Administered Date(s) Administered  . Influenza Split 11/29/2011  . Influenza, Seasonal, Injecte, Preservative Fre 01/10/2011  . Influenza,inj,Quad PF,6+ Mos 12/10/2012, 02/17/2014, 01/18/2015, 01/17/2016, 11/28/2016, 02/11/2018  . Influenza-Unspecified 02/17/2014  . Moderna Sars-Covid-2 Vaccination 06/01/2019, 06/29/2019, 02/02/2020  . Pneumococcal Conjugate-13 07/26/2015  . Pneumococcal Polysaccharide-23 01/10/2010  . Tdap 10/10/2011    TDAP status: Up to date  Flu Vaccine status: Up to date  Pneumococcal vaccine status: Up to date  Covid-19 vaccine status: Completed vaccines  Qualifies for Shingles Vaccine? Yes   Zostavax completed No   Shingrix Completed?: No.    Education has been provided regarding the importance of this vaccine. Patient has been advised to call insurance company to determine out of pocket expense if they have not yet received this vaccine. Advised may also receive vaccine at local pharmacy or Health Dept. Verbalized acceptance and understanding.  Screening Tests Health Maintenance  Topic Date Due  . MAMMOGRAM  04/07/2020  . INFLUENZA VACCINE  06/09/2020 (Originally 10/11/2019)  . DEXA SCAN   04/07/2021  . TETANUS/TDAP  10/09/2021  . COLONOSCOPY (Pts 45-27yrs Insurance coverage will need to be confirmed)  04/21/2024  . COVID-19 Vaccine  Completed  . Hepatitis C Screening  Completed  . HPV VACCINES  Aged Out  . HIV Screening  Discontinued    Health Maintenance  Health Maintenance Due  Topic Date Due  . MAMMOGRAM  04/07/2020    Colorectal cancer screening: Type of screening: Colonoscopy. Completed 04/22/2014. Repeat every 10 years  Mammogram status: Completed 04/14/2019. Repeat every year Ordered today  Bone Density status: Completed 04/08/2019. Results reflect: Bone density results: NORMAL. Repeat every 5 years.  Lung Cancer Screening: (Low Dose CT Chest recommended if Age 70-80 years, 30 pack-year currently smoking OR have quit w/in 15years.) does qualify. Completed 04/18/2020  Additional Screening:  Hepatitis C Screening: does qualify; Completed 07/25/2012  Vision Screening: Recommended annual ophthalmology exams for early detection of glaucoma and other disorders of the eye. Is the patient up to date with their annual eye exam?  Yes  Who is the provider or what is the name of the office in which the patient attends annual eye exams? [facility]  Dental Screening: Recommended annual dental exams for proper oral hygiene  Community Resource Referral / Chronic Care Management: CRR required this visit?  No   CCM required this visit?  No      Plan:     I have personally reviewed and noted the following in the patient's chart:   . Medical and social history . Use of alcohol, tobacco or illicit drugs  . Current medications and supplements . Functional ability and status .  Nutritional status . Physical activity . Advanced directives . List of other physicians . Hospitalizations, surgeries, and ER visits in previous 12 months . Vitals . Screenings to include cognitive, depression, and falls . Referrals and appointments  In addition, I have reviewed and  discussed with patient certain preventive protocols, quality metrics, and best practice recommendations. A written personalized care plan for preventive services as well as general preventive health recommendations were provided to patient.     Reather Littler, LPN   [id number]   Nurse Notes: None

## 2020-06-10 ENCOUNTER — Telehealth: Payer: Self-pay | Admitting: Family Medicine

## 2020-06-10 DIAGNOSIS — F331 Major depressive disorder, recurrent, moderate: Secondary | ICD-10-CM

## 2020-06-10 NOTE — Telephone Encounter (Signed)
lvm to inform pt to call the office and schedule an appt

## 2020-08-29 ENCOUNTER — Other Ambulatory Visit: Payer: Self-pay | Admitting: Family Medicine

## 2020-08-29 DIAGNOSIS — G4700 Insomnia, unspecified: Secondary | ICD-10-CM

## 2020-08-29 DIAGNOSIS — J432 Centrilobular emphysema: Secondary | ICD-10-CM

## 2020-09-15 ENCOUNTER — Other Ambulatory Visit: Payer: Self-pay | Admitting: Family Medicine

## 2020-09-15 DIAGNOSIS — F331 Major depressive disorder, recurrent, moderate: Secondary | ICD-10-CM

## 2020-09-15 DIAGNOSIS — K219 Gastro-esophageal reflux disease without esophagitis: Secondary | ICD-10-CM
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
## 2020-09-22 ENCOUNTER — Encounter: Payer: Self-pay | Admitting: Family Medicine

## 2020-09-22 ENCOUNTER — Other Ambulatory Visit: Payer: Self-pay

## 2020-09-22 ENCOUNTER — Ambulatory Visit: Payer: Medicare PPO | Admitting: Family Medicine

## 2020-09-22 VITALS — BP 120/64 | HR 83 | Temp 98.1°F | Resp 16 | Ht 69.0 in | Wt 168.0 lb

## 2020-09-22 DIAGNOSIS — F325 Major depressive disorder, single episode, in full remission: Secondary | ICD-10-CM | POA: Diagnosis not present

## 2020-09-22 DIAGNOSIS — E785 Hyperlipidemia, unspecified: Secondary | ICD-10-CM

## 2020-09-22 DIAGNOSIS — F419 Anxiety disorder, unspecified: Secondary | ICD-10-CM

## 2020-09-22 DIAGNOSIS — R739 Hyperglycemia, unspecified: Secondary | ICD-10-CM

## 2020-09-22 DIAGNOSIS — J449 Chronic obstructive pulmonary disease, unspecified: Secondary | ICD-10-CM | POA: Diagnosis not present

## 2020-09-22 DIAGNOSIS — G4700 Insomnia, unspecified: Secondary | ICD-10-CM

## 2020-09-22 DIAGNOSIS — Z87891 Personal history of nicotine dependence: Secondary | ICD-10-CM | POA: Diagnosis not present

## 2020-09-22 DIAGNOSIS — Z79899 Other long term (current) drug therapy: Secondary | ICD-10-CM

## 2020-09-22 DIAGNOSIS — J432 Centrilobular emphysema: Secondary | ICD-10-CM | POA: Diagnosis not present

## 2020-09-22 DIAGNOSIS — G90511 Complex regional pain syndrome I of right upper limb: Secondary | ICD-10-CM

## 2020-09-22 DIAGNOSIS — K219 Gastro-esophageal reflux disease without esophagitis: Secondary | ICD-10-CM

## 2020-09-22 DIAGNOSIS — D692 Other nonthrombocytopenic purpura: Secondary | ICD-10-CM

## 2020-09-22 MED ORDER — TRELEGY ELLIPTA 100-62.5-25 MCG/INH IN AEPB: 1.0000 | INHALATION_SPRAY | Freq: Every day | RESPIRATORY_TRACT | 0 refills | Status: DC

## 2020-09-22 MED ORDER — VENLAFAXINE HCL ER 150 MG PO CP24: ORAL_CAPSULE | ORAL | 0 refills | Status: DC

## 2020-09-22 MED ORDER — TEMAZEPAM 15 MG PO CAPS: 15.0000 mg | ORAL_CAPSULE | Freq: Every evening | ORAL | 0 refills | Status: DC | PRN

## 2020-09-22 MED ORDER — FAMOTIDINE 20 MG PO TABS: 20.0000 mg | ORAL_TABLET | Freq: Two times a day (BID) | ORAL | 1 refills | Status: DC

## 2020-09-22 NOTE — Patient Instructions (Signed)
Famotidine, ask for GoodRX coupon

## 2020-09-23 LAB — CBC WITH DIFFERENTIAL/PLATELET
Absolute Monocytes: 718 cells/uL (ref 200–950)
Basophils Absolute: 104 cells/uL (ref 0–200)
Basophils Relative: 1.5 %
Eosinophils Absolute: 959 cells/uL — ABNORMAL HIGH (ref 15–500)
Eosinophils Relative: 13.9 %
HCT: 37.8 % (ref 35.0–45.0)
Hemoglobin: 12.5 g/dL (ref 11.7–15.5)
Lymphs Abs: 2256 cells/uL (ref 850–3900)
MCH: 31.3 pg (ref 27.0–33.0)
MCHC: 33.1 g/dL (ref 32.0–36.0)
MCV: 94.7 fL (ref 80.0–100.0)
MPV: 10.2 fL (ref 7.5–12.5)
Monocytes Relative: 10.4 %
Neutro Abs: 2864 cells/uL (ref 1500–7800)
Neutrophils Relative %: 41.5 %
Platelets: 329 10*3/uL (ref 140–400)
RBC: 3.99 10*6/uL (ref 3.80–5.10)
RDW: 12 % (ref 11.0–15.0)
Total Lymphocyte: 32.7 %
WBC: 6.9 10*3/uL (ref 3.8–10.8)

## 2020-09-23 LAB — COMPLETE METABOLIC PANEL WITH GFR
AG Ratio: 1.4 (calc) (ref 1.0–2.5)
ALT: 20 U/L (ref 6–29)
AST: 18 U/L (ref 10–35)
Albumin: 4.2 g/dL (ref 3.6–5.1)
Alkaline phosphatase (APISO): 81 U/L (ref 37–153)
BUN: 17 mg/dL (ref 7–25)
CO2: 30 mmol/L (ref 20–32)
Calcium: 9.3 mg/dL (ref 8.6–10.4)
Chloride: 103 mmol/L (ref 98–110)
Creat: 0.92 mg/dL (ref 0.50–1.03)
Globulin: 3.1 g/dL (calc) (ref 1.9–3.7)
Glucose, Bld: 76 mg/dL (ref 65–99)
Potassium: 4.9 mmol/L (ref 3.5–5.3)
Sodium: 137 mmol/L (ref 135–146)
Total Bilirubin: 0.3 mg/dL (ref 0.2–1.2)
Total Protein: 7.3 g/dL (ref 6.1–8.1)
eGFR: 73 mL/min/{1.73_m2} (ref >=60)

## 2020-09-23 LAB — LIPID PANEL
Cholesterol: 201 mg/dL — ABNORMAL HIGH (ref <200)
HDL: 48 mg/dL — ABNORMAL LOW (ref >=50)
LDL Cholesterol (Calc): 128 mg/dL (calc) — ABNORMAL HIGH
Non-HDL Cholesterol (Calc): 153 mg/dL (calc) — ABNORMAL HIGH (ref <130)
Total CHOL/HDL Ratio: 4.2 (calc) (ref <5.0)
Triglycerides: 142 mg/dL (ref <150)

## 2020-09-23 LAB — HEMOGLOBIN A1C
Hgb A1c MFr Bld: 5.4 % of total Hgb (ref <5.7)
Mean Plasma Glucose: 108 mg/dL
eAG (mmol/L): 6 mmol/L

## 2020-09-27 ENCOUNTER — Other Ambulatory Visit: Payer: Self-pay | Admitting: Family Medicine

## 2020-09-27 ENCOUNTER — Encounter: Payer: Self-pay | Admitting: Family Medicine

## 2020-09-27 DIAGNOSIS — G4700 Insomnia, unspecified: Secondary | ICD-10-CM

## 2020-09-27 MED ORDER — QUVIVIQ 50 MG PO TABS: 1.0000 | ORAL_TABLET | Freq: Every evening | ORAL | 2 refills | Status: DC

## 2020-09-27 NOTE — Telephone Encounter (Signed)
Last seen 7.14.2022 upcoming 11.14.2022

## 2020-09-27 NOTE — Telephone Encounter (Signed)
  Notes to clinic:  The original prescription was discontinued on 09/22/2020 by Alba Cory, MD for the following reason: Change in therapy. Renewing this prescription may not be appropriate   Requested Prescriptions  Pending Prescriptions Disp Refills   QUEtiapine (SEROQUEL) 25 MG tablet [Pharmacy Med Name: QUETIAPINE 25MG  TABLETS] 30 tablet 0    Sig: TAKE 1 TABLET(25 MG) BY MOUTH AT BEDTIME      Not Delegated - Psychiatry:  Antipsychotics - Second Generation (Atypical) - quetiapine Failed - 09/27/2020 10:45 AM      Failed - This refill cannot be delegated      Passed - ALT in normal range and within 180 days    ALT  Date Value Ref Range Status  09/22/2020 20 6 - 29 U/L Final   SGPT (ALT)  Date Value Ref Range Status  07/14/2012 23 12 - 78 U/L Final          Passed - AST in normal range and within 180 days    AST  Date Value Ref Range Status  09/22/2020 18 10 - 35 U/L Final   SGOT(AST)  Date Value Ref Range Status  07/14/2012 14 (L) 15 - 37 Unit/L Final          Passed - Completed PHQ-2 or PHQ-9 in the last 360 days      Passed - Last BP in normal range    BP Readings from Last 1 Encounters:  09/22/20 120/64          Passed - Valid encounter within last 6 months    Recent Outpatient Visits           5 days ago Major depression in remission [facility])   [facility] [place], Leugnies, MD   6 months ago Depression, major, recurrent, in remission [facility])   [facility] [place], Leugnies, MD   10 months ago Complex regional pain syndrome type 1 of right upper extremity   [facility] [place], Leugnies, MD   11 months ago Complex regional pain syndrome type 1 of right upper extremity   [facility], MD   1 year ago Moderate recurrent major depression [facility])   [facility], MD       Future Appointments             In 3 months Alba Cory,  Carlynn Purl, MD [facility], [facility]   In 8 months  [facility], [facility]

## 2020-09-29 ENCOUNTER — Encounter: Payer: Self-pay | Admitting: Family Medicine

## 2020-09-30 ENCOUNTER — Other Ambulatory Visit: Payer: Self-pay | Admitting: Family Medicine

## 2020-09-30 MED ORDER — BELSOMRA 10 MG PO TABS: 10.0000 mg | ORAL_TABLET | Freq: Every evening | ORAL | 0 refills | Status: DC | PRN

## 2020-10-03 ENCOUNTER — Ambulatory Visit: Payer: Medicare PPO | Admitting: Family Medicine

## 2020-10-13 ENCOUNTER — Encounter: Payer: Self-pay | Admitting: Family Medicine

## 2020-10-13 ENCOUNTER — Other Ambulatory Visit: Payer: Self-pay | Admitting: Family Medicine

## 2020-10-13 DIAGNOSIS — G4700 Insomnia, unspecified: Secondary | ICD-10-CM

## 2020-10-13 MED ORDER — DOXEPIN HCL 10 MG PO CAPS: 10.0000 mg | ORAL_CAPSULE | Freq: Every day | ORAL | 0 refills | Status: DC

## 2020-10-13 MED ORDER — BELSOMRA 20 MG PO TABS: 20.0000 mg | ORAL_TABLET | Freq: Every evening | ORAL | 0 refills | Status: DC | PRN

## 2020-10-21 ENCOUNTER — Encounter: Payer: Self-pay | Admitting: Family Medicine

## 2020-10-21 ENCOUNTER — Other Ambulatory Visit: Payer: Self-pay | Admitting: Family Medicine

## 2020-10-21 MED ORDER — ESZOPICLONE 2 MG PO TABS: 2.0000 mg | ORAL_TABLET | Freq: Every evening | ORAL | 0 refills | Status: DC | PRN

## 2020-11-07 ENCOUNTER — Encounter: Payer: Self-pay | Admitting: Family Medicine

## 2020-11-07 ENCOUNTER — Other Ambulatory Visit: Payer: Self-pay | Admitting: Family Medicine

## 2020-11-07 DIAGNOSIS — G4700 Insomnia, unspecified: Secondary | ICD-10-CM

## 2020-11-08 ENCOUNTER — Other Ambulatory Visit: Payer: Self-pay | Admitting: Family Medicine

## 2020-11-08 DIAGNOSIS — G4700 Insomnia, unspecified: Secondary | ICD-10-CM

## 2020-11-08 MED ORDER — ESZOPICLONE 2 MG PO TABS: 2.0000 mg | ORAL_TABLET | Freq: Every evening | ORAL | 0 refills | Status: DC | PRN

## 2020-11-08 MED ORDER — DOXEPIN HCL 25 MG PO CAPS: 25.0000 mg | ORAL_CAPSULE | Freq: Every day | ORAL | 1 refills | Status: DC

## 2020-11-09 ENCOUNTER — Other Ambulatory Visit: Payer: Self-pay | Admitting: Family Medicine

## 2020-11-09 DIAGNOSIS — G4700 Insomnia, unspecified: Secondary | ICD-10-CM

## 2021-01-02 ENCOUNTER — Other Ambulatory Visit: Payer: Self-pay

## 2021-01-02 ENCOUNTER — Ambulatory Visit
Admission: RE | Admit: 2021-01-02 | Discharge: 2021-01-02 | Disposition: A | Discharge status: Home / Self Care | Payer: Medicare PPO | Source: Ambulatory Visit | Attending: Family Medicine | Admitting: Family Medicine

## 2021-01-02 DIAGNOSIS — Z1231 Encounter for screening mammogram for malignant neoplasm of breast: Secondary | ICD-10-CM | POA: Diagnosis not present
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
## 2021-01-23 ENCOUNTER — Ambulatory Visit: Payer: Medicare PPO | Admitting: Family Medicine

## 2021-01-23 ENCOUNTER — Encounter: Payer: Self-pay | Admitting: Family Medicine

## 2021-01-23 ENCOUNTER — Other Ambulatory Visit: Payer: Self-pay

## 2021-01-23 VITALS — BP 130/70 | HR 82 | Temp 98.2°F | Resp 16 | Ht 69.0 in | Wt 167.0 lb

## 2021-01-23 DIAGNOSIS — Z87891 Personal history of nicotine dependence: Secondary | ICD-10-CM | POA: Diagnosis not present

## 2021-01-23 DIAGNOSIS — Z23 Encounter for immunization: Secondary | ICD-10-CM | POA: Diagnosis not present

## 2021-01-23 DIAGNOSIS — G4700 Insomnia, unspecified: Secondary | ICD-10-CM

## 2021-01-23 DIAGNOSIS — J432 Centrilobular emphysema: Secondary | ICD-10-CM | POA: Diagnosis not present

## 2021-01-23 DIAGNOSIS — K219 Gastro-esophageal reflux disease without esophagitis: Secondary | ICD-10-CM

## 2021-01-23 DIAGNOSIS — G90511 Complex regional pain syndrome I of right upper limb: Secondary | ICD-10-CM | POA: Diagnosis not present

## 2021-01-23 DIAGNOSIS — F325 Major depressive disorder, single episode, in full remission: Secondary | ICD-10-CM | POA: Diagnosis not present

## 2021-01-23 DIAGNOSIS — J449 Chronic obstructive pulmonary disease, unspecified: Secondary | ICD-10-CM

## 2021-01-23 DIAGNOSIS — D692 Other nonthrombocytopenic purpura: Secondary | ICD-10-CM | POA: Diagnosis not present

## 2021-01-23 DIAGNOSIS — E785 Hyperlipidemia, unspecified: Secondary | ICD-10-CM | POA: Diagnosis not present

## 2021-01-23 MED ORDER — DOXEPIN HCL 25 MG PO CAPS
25.0000 mg | ORAL_CAPSULE | Freq: Every day | ORAL | 1 refills | Status: DC
Start: 2021-01-23 — End: 2021-07-24

## 2021-01-23 MED ORDER — ESOMEPRAZOLE MAGNESIUM 40 MG PO CPDR: 40.0000 mg | DELAYED_RELEASE_CAPSULE | Freq: Two times a day (BID) | ORAL | 1 refills | Status: DC

## 2021-01-23 MED ORDER — ESZOPICLONE 2 MG PO TABS: 2.0000 mg | ORAL_TABLET | Freq: Every evening | ORAL | 1 refills | Status: DC | PRN

## 2021-01-23 MED ORDER — VENLAFAXINE HCL ER 150 MG PO CP24: ORAL_CAPSULE | ORAL | 1 refills | Status: DC

## 2021-02-26 ENCOUNTER — Encounter: Payer: Self-pay | Admitting: Family Medicine

## 2021-03-22 ENCOUNTER — Other Ambulatory Visit: Payer: Self-pay | Admitting: Family Medicine

## 2021-03-22 DIAGNOSIS — M545 Low back pain, unspecified: Secondary | ICD-10-CM | POA: Diagnosis not present

## 2021-03-22 DIAGNOSIS — K219 Gastro-esophageal reflux disease without esophagitis: Secondary | ICD-10-CM

## 2021-03-22 DIAGNOSIS — M5416 Radiculopathy, lumbar region: Secondary | ICD-10-CM | POA: Diagnosis not present

## 2021-03-22 NOTE — Telephone Encounter (Signed)
Requested Prescriptions  Pending Prescriptions Disp Refills   esomeprazole (NEXIUM) 40 MG capsule [Pharmacy Med Name: ESOMEPRAZOLE MAGNESIUM 40MG  DR CAPS] 180 capsule 1    Sig: TAKE ONE CAPSULE BY MOUTH TWICE DAILY     Gastroenterology: Proton Pump Inhibitors Passed - 03/22/2021  3:10 AM      Passed - Valid encounter within last 12 months    Recent Outpatient Visits          1 month ago Centrilobular emphysema [facility])   [facility] [place], Leugnies, MD   6 months ago Major depression in remission [facility])   [place] [facility], MD   1 year ago Depression, major, recurrent, in remission [facility])   [facility], MD   1 year ago Complex regional pain syndrome type 1 of right upper extremity   [facility], MD   1 year ago Complex regional pain syndrome type 1 of right upper extremity   [facility], MD      Future Appointments            In 2 months  [facility]   In 4 months [facility], MD [facility], [facility]

## 2021-04-18 DIAGNOSIS — M5451 Vertebrogenic low back pain: Secondary | ICD-10-CM | POA: Diagnosis not present

## 2021-05-01 DIAGNOSIS — R262 Difficulty in walking, not elsewhere classified: Secondary | ICD-10-CM | POA: Diagnosis not present

## 2021-05-01 DIAGNOSIS — M25551 Pain in right hip: Secondary | ICD-10-CM | POA: Diagnosis not present

## 2021-05-01 DIAGNOSIS — M5451 Vertebrogenic low back pain: Secondary | ICD-10-CM | POA: Diagnosis not present

## 2021-05-05 DIAGNOSIS — R262 Difficulty in walking, not elsewhere classified: Secondary | ICD-10-CM | POA: Diagnosis not present

## 2021-05-05 DIAGNOSIS — M5451 Vertebrogenic low back pain: Secondary | ICD-10-CM | POA: Diagnosis not present

## 2021-05-05 DIAGNOSIS — M25551 Pain in right hip: Secondary | ICD-10-CM | POA: Diagnosis not present

## 2021-05-09 DIAGNOSIS — M25551 Pain in right hip: Secondary | ICD-10-CM | POA: Diagnosis not present

## 2021-05-09 DIAGNOSIS — R262 Difficulty in walking, not elsewhere classified: Secondary | ICD-10-CM | POA: Diagnosis not present

## 2021-05-09 DIAGNOSIS — M5451 Vertebrogenic low back pain: Secondary | ICD-10-CM | POA: Diagnosis not present

## 2021-05-11 DIAGNOSIS — M5451 Vertebrogenic low back pain: Secondary | ICD-10-CM | POA: Diagnosis not present

## 2021-05-11 DIAGNOSIS — M25551 Pain in right hip: Secondary | ICD-10-CM | POA: Diagnosis not present

## 2021-05-11 DIAGNOSIS — R262 Difficulty in walking, not elsewhere classified: Secondary | ICD-10-CM | POA: Diagnosis not present

## 2021-05-16 DIAGNOSIS — M5451 Vertebrogenic low back pain: Secondary | ICD-10-CM | POA: Diagnosis not present

## 2021-05-18 NOTE — Addendum Note (Signed)
Encounter addended by: Novella Olive on: 05/18/2021 1:01 PM  Actions taken: Letter saved

## 2021-05-26 ENCOUNTER — Telehealth: Payer: Self-pay

## 2021-05-26 ENCOUNTER — Other Ambulatory Visit: Payer: Self-pay | Admitting: Family Medicine

## 2021-05-26 DIAGNOSIS — Z122 Encounter for screening for malignant neoplasm of respiratory organs: Secondary | ICD-10-CM

## 2021-05-26 DIAGNOSIS — I700 Atherosclerosis of aorta: Secondary | ICD-10-CM | POA: Insufficient documentation

## 2021-05-26 NOTE — Telephone Encounter (Signed)
Copied from CRM [id number]. Topic: General - Inquiry ?>> May 25, 2021  1:08 PM Tara [name] E wrote: ?Reason for CRM: Pt called about the letter she received to follow up with her PCP about a lung screen / please see in letters / pt would like to know why she received this letter a yr later and if there is anything she needs to do/ please advise ?

## 2021-05-26 NOTE — Telephone Encounter (Signed)
Pt aware of order placed and that we'll review at her next visit. ?

## 2021-05-30 DIAGNOSIS — M5416 Radiculopathy, lumbar region: Secondary | ICD-10-CM | POA: Diagnosis not present

## 2021-06-05 DIAGNOSIS — M5416 Radiculopathy, lumbar region: Secondary | ICD-10-CM | POA: Diagnosis not present

## 2021-06-05 DIAGNOSIS — M5451 Vertebrogenic low back pain: Secondary | ICD-10-CM | POA: Diagnosis not present

## 2021-06-06 ENCOUNTER — Other Ambulatory Visit: Payer: Self-pay | Admitting: Orthopedic Surgery

## 2021-06-06 ENCOUNTER — Ambulatory Visit: Payer: Medicare PPO

## 2021-06-06 DIAGNOSIS — M5416 Radiculopathy, lumbar region: Secondary | ICD-10-CM

## 2021-06-07 ENCOUNTER — Ambulatory Visit
Admission: RE | Admit: 2021-06-07 | Discharge: 2021-06-07 | Disposition: A | Discharge status: Home / Self Care | Payer: Medicare PPO | Source: Ambulatory Visit | Attending: Orthopedic Surgery | Admitting: Orthopedic Surgery

## 2021-06-07 DIAGNOSIS — M48061 Spinal stenosis, lumbar region without neurogenic claudication: Secondary | ICD-10-CM | POA: Diagnosis not present

## 2021-06-07 DIAGNOSIS — M5416 Radiculopathy, lumbar region: Secondary | ICD-10-CM | POA: Diagnosis not present

## 2021-06-07 DIAGNOSIS — M5126 Other intervertebral disc displacement, lumbar region: Secondary | ICD-10-CM | POA: Diagnosis not present

## 2021-06-07 MED ORDER — MEPERIDINE HCL 50 MG/ML IJ SOLN: 50.0000 mg | Freq: Once | INTRAMUSCULAR | Status: DC | PRN

## 2021-06-07 MED ORDER — IOPAMIDOL (ISOVUE-M 200) INJECTION 41%
15.0000 mL | Freq: Once | INTRAMUSCULAR | Status: AC
  Administered 2021-06-07: 15 mL via INTRATHECAL

## 2021-06-07 MED ORDER — ONDANSETRON HCL 4 MG/2ML IJ SOLN: 4.0000 mg | Freq: Once | INTRAMUSCULAR | Status: DC | PRN

## 2021-06-07 MED ORDER — DIAZEPAM 5 MG PO TABS
10.0000 mg | ORAL_TABLET | Freq: Once | ORAL | Status: AC
  Administered 2021-06-07: 5 mg via ORAL

## 2021-06-07 NOTE — Progress Notes (Signed)
Pt has spinal cord stimulator and reports there are no batteries in the device. ?

## 2021-06-07 NOTE — Discharge Instructions (Signed)

## 2021-06-08 ENCOUNTER — Other Ambulatory Visit: Payer: Self-pay | Admitting: *Deleted

## 2021-06-08 DIAGNOSIS — Z87891 Personal history of nicotine dependence: Secondary | ICD-10-CM

## 2021-06-12 DIAGNOSIS — M5136 Other intervertebral disc degeneration, lumbar region: Secondary | ICD-10-CM | POA: Diagnosis not present

## 2021-06-12 DIAGNOSIS — M5451 Vertebrogenic low back pain: Secondary | ICD-10-CM | POA: Diagnosis not present

## 2021-06-13 ENCOUNTER — Ambulatory Visit: Payer: Medicare PPO

## 2021-06-16 ENCOUNTER — Ambulatory Visit: Payer: Medicare PPO | Admitting: Nurse Practitioner

## 2021-06-21 ENCOUNTER — Ambulatory Visit: Payer: Medicare PPO | Attending: Acute Care

## 2021-06-22 ENCOUNTER — Encounter: Payer: Self-pay | Admitting: Nurse Practitioner

## 2021-06-22 ENCOUNTER — Ambulatory Visit: Payer: Medicare PPO | Admitting: Nurse Practitioner

## 2021-06-22 VITALS — BP 132/80 | HR 80 | Resp 16 | Ht 69.0 in | Wt 169.0 lb

## 2021-06-22 DIAGNOSIS — M5136 Other intervertebral disc degeneration, lumbar region: Secondary | ICD-10-CM | POA: Diagnosis not present

## 2021-06-22 DIAGNOSIS — Z01818 Encounter for other preprocedural examination: Secondary | ICD-10-CM | POA: Diagnosis not present

## 2021-06-22 NOTE — Progress Notes (Signed)
? ?BP 132/80   Pulse 80   Resp 16   Ht 5' 9"  (1.753 m)   Wt 169 lb (76.7 kg)   SpO2 99%   BMI 24.96 kg/m?   ? ?Subjective:  ? ? Patient ID: Tara [name], female    DOB: 11/10/1965, 56 y.o.   MRN: [id number] ? ?HPI: ?Tara [name] is a 56 y.o. female ? ?Chief Complaint  ?Patient presents with  ? Surgical Clearance  ? ?Surgical clearance/DDD: Patient reports that she has degenerative disc disease in her lumbar region and is getting it repaired.  [facility] sent patient for surgical clearance for a Anterior Lumbar Interbody Fusion of L5-S1.  Surgery is not scheduled yet. She is scheduled for injections 4/27 and then will be scheduling surgery.  No chest pain, no shortness of breath.  EKG showed no abnormality normal sinus rhythm.  We will get blood work do not anticipate any issues patient cleared for surgery. ? ?Relevant past medical, surgical, family and social history reviewed and updated as indicated. Interim medical history since our last visit reviewed. ?Allergies and medications reviewed and updated. ? ?Review of Systems ? ?Constitutional: Negative for fever or weight change.  ?Respiratory: Negative for cough and shortness of breath.   ?Cardiovascular: Negative for chest pain or palpitations.  ?Gastrointestinal: Negative for abdominal pain, no bowel changes.  ?Musculoskeletal: Negative for gait problem or joint swelling. Positive for back pain ?Skin: Negative for rash.  ?Neurological: Negative for dizziness or headache.  ?No other specific complaints in a complete review of systems (except as listed in HPI above).  ? ?   ?Objective:  ?  ?BP 132/80   Pulse 80   Resp 16   Ht 5' 9"  (1.753 m)   Wt 169 lb (76.7 kg)   SpO2 99%   BMI 24.96 kg/m?   ?Wt Readings from Last 3 Encounters:  ?06/22/21 169 lb (76.7 kg)  ?01/23/21 167 lb (75.8 kg)  ?09/22/20 168 lb (76.2 kg)  ?  ?Physical Exam ? ?Constitutional: Patient appears well-developed and well-nourished.  No distress.  ?HEENT: head atraumatic,  normocephalic, pupils equal and reactive to light, neck supple ?Cardiovascular: Normal rate, regular rhythm and normal heart sounds.  No murmur heard. No BLE edema. ?Pulmonary/Chest: Effort normal and breath sounds normal. No respiratory distress. ?Abdominal: Soft.  There is no tenderness. ?Psychiatric: Patient has a normal mood and affect. behavior is normal. Judgment and thought content normal.  ?Results for orders placed or performed in visit on 09/22/20  ?Lipid panel  ?Result Value Ref Range  ? Cholesterol 201 (H) <200 mg/dL  ? HDL 48 (L) > OR = 50 mg/dL  ? Triglycerides 142 <150 mg/dL  ? LDL Cholesterol (Calc) 128 (H) mg/dL (calc)  ? Total CHOL/HDL Ratio 4.2 <5.0 (calc)  ? Non-HDL Cholesterol (Calc) 153 (H) <130 mg/dL (calc)  ?CBC with Differential/Platelet  ?Result Value Ref Range  ? WBC 6.9 3.8 - 10.8 Thousand/uL  ? RBC 3.99 3.80 - 5.10 Million/uL  ? Hemoglobin 12.5 11.7 - 15.5 g/dL  ? HCT 37.8 35.0 - 45.0 %  ? MCV 94.7 80.0 - 100.0 fL  ? MCH 31.3 27.0 - 33.0 pg  ? MCHC 33.1 32.0 - 36.0 g/dL  ? RDW 12.0 11.0 - 15.0 %  ? Platelets 329 140 - 400 Thousand/uL  ? MPV 10.2 7.5 - 12.5 fL  ? Neutro Abs 2,864 1,500 - 7,800 cells/uL  ? Lymphs Abs 2,256 850 - 3,900 cells/uL  ? Absolute Monocytes 718 200 -  950 cells/uL  ? Eosinophils Absolute 959 (H) 15 - 500 cells/uL  ? Basophils Absolute 104 0 - 200 cells/uL  ? Neutrophils Relative % 41.5 %  ? Total Lymphocyte 32.7 %  ? Monocytes Relative 10.4 %  ? Eosinophils Relative 13.9 %  ? Basophils Relative 1.5 %  ?COMPLETE METABOLIC PANEL WITH GFR  ?Result Value Ref Range  ? Glucose, Bld 76 65 - 99 mg/dL  ? BUN 17 7 - 25 mg/dL  ? Creat 0.92 0.50 - 1.03 mg/dL  ? eGFR 73 > OR = 60 mL/min/1.23m  ? BUN/Creatinine Ratio NOT APPLICABLE 6 - 22 (calc)  ? Sodium 137 135 - 146 mmol/L  ? Potassium 4.9 3.5 - 5.3 mmol/L  ? Chloride 103 98 - 110 mmol/L  ? CO2 30 20 - 32 mmol/L  ? Calcium 9.3 8.6 - 10.4 mg/dL  ? Total Protein 7.3 6.1 - 8.1 g/dL  ? Albumin 4.2 3.6 - 5.1 g/dL  ? Globulin 3.1  1.9 - 3.7 g/dL (calc)  ? AG Ratio 1.4 1.0 - 2.5 (calc)  ? Total Bilirubin 0.3 0.2 - 1.2 mg/dL  ? Alkaline phosphatase (APISO) 81 37 - 153 U/L  ? AST 18 10 - 35 U/L  ? ALT 20 6 - 29 U/L  ?Hemoglobin A1c  ?Result Value Ref Range  ? Hgb A1c MFr Bld 5.4 <5.7 % of total Hgb  ? Mean Plasma Glucose 108 mg/dL  ? eAG (mmol/L) 6.0 mmol/L  ? ?   ?Assessment & Plan:  ? ?1. Pre-operative clearance ?Cleared for surgery ?- CBC with Differential/Platelet ?- COMPLETE METABOLIC PANEL WITH GFR ?- EKG 12-Lead ? ?2. DDD (degenerative disc disease), lumbar ?Cleared for surgery ? ?Follow up plan: ?Return if symptoms worsen or fail to improve. ? ? ? ? ? ?

## 2021-06-23 LAB — CBC WITH DIFFERENTIAL/PLATELET
Absolute Monocytes: 787 cells/uL (ref 200–950)
Basophils Absolute: 90 cells/uL (ref 0–200)
Basophils Relative: 1.1 %
Eosinophils Absolute: 1246 cells/uL — ABNORMAL HIGH (ref 15–500)
Eosinophils Relative: 15.2 %
HCT: 39.8 % (ref 35.0–45.0)
Hemoglobin: 13.4 g/dL (ref 11.7–15.5)
Lymphs Abs: 2813 cells/uL (ref 850–3900)
MCH: 31.5 pg (ref 27.0–33.0)
MCHC: 33.7 g/dL (ref 32.0–36.0)
MCV: 93.4 fL (ref 80.0–100.0)
MPV: 10.1 fL (ref 7.5–12.5)
Monocytes Relative: 9.6 %
Neutro Abs: 3264 cells/uL (ref 1500–7800)
Neutrophils Relative %: 39.8 %
Platelets: 337 10*3/uL (ref 140–400)
RBC: 4.26 10*6/uL (ref 3.80–5.10)
RDW: 12 % (ref 11.0–15.0)
Total Lymphocyte: 34.3 %
WBC: 8.2 10*3/uL (ref 3.8–10.8)

## 2021-06-23 LAB — COMPLETE METABOLIC PANEL WITH GFR
AG Ratio: 1.3 (calc) (ref 1.0–2.5)
ALT: 21 U/L (ref 6–29)
AST: 18 U/L (ref 10–35)
Albumin: 4.2 g/dL (ref 3.6–5.1)
Alkaline phosphatase (APISO): 75 U/L (ref 37–153)
BUN: 13 mg/dL (ref 7–25)
CO2: 29 mmol/L (ref 20–32)
Calcium: 9.7 mg/dL (ref 8.6–10.4)
Chloride: 96 mmol/L — ABNORMAL LOW (ref 98–110)
Creat: 0.85 mg/dL (ref 0.50–1.03)
Globulin: 3.3 g/dL (calc) (ref 1.9–3.7)
Glucose, Bld: 93 mg/dL (ref 65–99)
Potassium: 5.4 mmol/L — ABNORMAL HIGH (ref 3.5–5.3)
Sodium: 132 mmol/L — ABNORMAL LOW (ref 135–146)
Total Bilirubin: 0.3 mg/dL (ref 0.2–1.2)
Total Protein: 7.5 g/dL (ref 6.1–8.1)
eGFR: 80 mL/min/{1.73_m2} (ref >=60)

## 2021-06-24 ENCOUNTER — Encounter: Payer: Self-pay | Admitting: Family Medicine

## 2021-06-25 ENCOUNTER — Encounter: Payer: Self-pay | Admitting: Family Medicine

## 2021-06-27 ENCOUNTER — Encounter: Payer: Self-pay | Admitting: Family Medicine

## 2021-06-29 ENCOUNTER — Other Ambulatory Visit: Payer: Self-pay | Admitting: Nurse Practitioner

## 2021-06-29 DIAGNOSIS — F419 Anxiety disorder, unspecified: Secondary | ICD-10-CM

## 2021-06-29 MED ORDER — HYDROXYZINE PAMOATE 25 MG PO CAPS: 25.0000 mg | ORAL_CAPSULE | Freq: Three times a day (TID) | ORAL | 0 refills | Status: DC | PRN

## 2021-07-06 DIAGNOSIS — M47816 Spondylosis without myelopathy or radiculopathy, lumbar region: Secondary | ICD-10-CM | POA: Diagnosis not present

## 2021-07-10 ENCOUNTER — Ambulatory Visit: Payer: Self-pay | Admitting: Orthopedic Surgery

## 2021-07-13 ENCOUNTER — Ambulatory Visit (INDEPENDENT_AMBULATORY_CARE_PROVIDER_SITE_OTHER): Payer: Medicare PPO

## 2021-07-13 DIAGNOSIS — Z0000 Encounter for general adult medical examination without abnormal findings: Secondary | ICD-10-CM | POA: Diagnosis not present

## 2021-07-13 NOTE — Patient Instructions (Signed)
Ms. Olden , ?Thank you for taking time to come for your Medicare Wellness Visit. I appreciate your ongoing commitment to your health goals. Please review the following plan we discussed and let me know if I can assist you in the future.  ? ?Screening recommendations/referrals: ?Colonoscopy: done 04/21/14. Repeat 04/2024 ?Mammogram: done 01/02/21 ?Bone Density: done 1/27/1. due ?Recommended yearly ophthalmology/optometry visit for glaucoma screening and checkup ?Recommended yearly dental visit for hygiene and checkup ? ?Vaccinations: ?Influenza vaccine: done 01/23/21 ?Pneumococcal vaccine: done 07/26/15 ?Tdap vaccine: done 10/10/11 ?Shingles vaccine: Shingrix discussed. Please contact your pharmacy for coverage information.  ?Covid-19: done 06/01/19, 06/29/19 & 02/02/20 ? ?Advanced directives: Please bring a copy of your health care power of attorney and living will to the office at your convenience once you have completed those documents ? ?Conditions/risks identified: recommend continuing fall prevention at home ? ?Next appointment: Follow up in one year for your annual wellness visit.  ? ?Preventive Care 40-64 Years, Female ?Preventive care refers to lifestyle choices and visits with your health care provider that can promote health and wellness. ?What does preventive care include? ?A yearly physical exam. This is also called an annual well check. ?Dental exams once or twice a year. ?Routine eye exams. Ask your health care provider how often you should have your eyes checked. ?Personal lifestyle choices, including: ?Daily care of your teeth and gums. ?Regular physical activity. ?Eating a healthy diet. ?Avoiding tobacco and drug use. ?Limiting alcohol use. ?Practicing safe sex. ?Taking low-dose aspirin daily starting at age 44. ?Taking vitamin and mineral supplements as recommended by your health care provider. ?What happens during an annual well check? ?The services and screenings done by your health care provider  during your annual well check will depend on your age, overall health, lifestyle risk factors, and family history of disease. ?Counseling  ?Your health care provider may ask you questions about your: ?Alcohol use. ?Tobacco use. ?Drug use. ?Emotional well-being. ?Home and relationship well-being. ?Sexual activity. ?Eating habits. ?Work and work Statistician. ?Method of birth control. ?Menstrual cycle. ?Pregnancy history. ?Screening  ?You may have the following tests or measurements: ?Height, weight, and BMI. ?Blood pressure. ?Lipid and cholesterol levels. These may be checked every 5 years, or more frequently if you are over 27 years old. ?Skin check. ?Lung cancer screening. You may have this screening every year starting at age 55 if you have a 30-pack-year history of smoking and currently smoke or have quit within the past 15 years. ?Fecal occult blood test (FOBT) of the stool. You may have this test every year starting at age 71. ?Flexible sigmoidoscopy or colonoscopy. You may have a sigmoidoscopy every 5 years or a colonoscopy every 10 years starting at age 57. ?Hepatitis C blood test. ?Hepatitis B blood test. ?Sexually transmitted disease (STD) testing. ?Diabetes screening. This is done by checking your blood sugar (glucose) after you have not eaten for a while (fasting). You may have this done every 1-3 years. ?Mammogram. This may be done every 1-2 years. Talk to your health care provider about when you should start having regular mammograms. This may depend on whether you have a family history of breast cancer. ?BRCA-related cancer screening. This may be done if you have a family history of breast, ovarian, tubal, or peritoneal cancers. ?Pelvic exam and Pap test. This may be done every 3 years starting at age 94. Starting at age 35, this may be done every 5 years if you have a Pap test in combination with an HPV  test. ?Bone density scan. This is done to screen for osteoporosis. You may have this scan if you are  at high risk for osteoporosis. ?Discuss your test results, treatment options, and if necessary, the need for more tests with your health care provider. ?Vaccines  ?Your health care provider may recommend certain vaccines, such as: ?Influenza vaccine. This is recommended every year. ?Tetanus, diphtheria, and acellular pertussis (Tdap, Td) vaccine. You may need a Td booster every 10 years. ?Zoster vaccine. You may need this after age 61. ?Pneumococcal 13-valent conjugate (PCV13) vaccine. You may need this if you have certain conditions and were not previously vaccinated. ?Pneumococcal polysaccharide (PPSV23) vaccine. You may need one or two doses if you smoke cigarettes or if you have certain conditions. ?Talk to your health care provider about which screenings and vaccines you need and how often you need them. ?This information is not intended to replace advice given to you by your health care provider. Make sure you discuss any questions you have with your health care provider. ?Document Released: 03/25/2015 Document Revised: 11/16/2015 Document Reviewed: 12/28/2014 ?Elsevier Interactive Patient Education ? 2017 [employer] ? ? ? ?Fall Prevention in the Home ?Falls can cause injuries. They can happen to people of all ages. There are many things you can do to make your home safe and to help prevent falls. ?What can I do on the outside of my home? ?Regularly fix the edges of walkways and driveways and fix any cracks. ?Remove anything that might make you trip as you walk through a door, such as a raised step or threshold. ?Trim any bushes or trees on the path to your home. ?Use bright outdoor lighting. ?Clear any walking paths of anything that might make someone trip, such as rocks or tools. ?Regularly check to see if handrails are loose or broken. Make sure that both sides of any steps have handrails. ?Any raised decks and porches should have guardrails on the edges. ?Have any leaves, snow, or ice cleared  regularly. ?Use sand or salt on walking paths during winter. ?Clean up any spills in your garage right away. This includes oil or grease spills. ?What can I do in the bathroom? ?Use night lights. ?Install grab bars by the toilet and in the tub and shower. Do not use towel bars as grab bars. ?Use non-skid mats or decals in the tub or shower. ?If you need to sit down in the shower, use a plastic, non-slip stool. ?Keep the floor dry. Clean up any water that spills on the floor as soon as it happens. ?Remove soap buildup in the tub or shower regularly. ?Attach bath mats securely with double-sided non-slip rug tape. ?Do not have throw rugs and other things on the floor that can make you trip. ?What can I do in the bedroom? ?Use night lights. ?Make sure that you have a light by your bed that is easy to reach. ?Do not use any sheets or blankets that are too big for your bed. They should not hang down onto the floor. ?Have a firm chair that has side arms. You can use this for support while you get dressed. ?Do not have throw rugs and other things on the floor that can make you trip. ?What can I do in the kitchen? ?Clean up any spills right away. ?Avoid walking on wet floors. ?Keep items that you use a lot in easy-to-reach places. ?If you need to reach something above you, use a strong step stool that has a grab  bar. ?Keep electrical cords out of the way. ?Do not use floor polish or wax that makes floors slippery. If you must use wax, use non-skid floor wax. ?Do not have throw rugs and other things on the floor that can make you trip. ?What can I do with my stairs? ?Do not leave any items on the stairs. ?Make sure that there are handrails on both sides of the stairs and use them. Fix handrails that are broken or loose. Make sure that handrails are as long as the stairways. ?Check any carpeting to make sure that it is firmly attached to the stairs. Fix any carpet that is loose or worn. ?Avoid having throw rugs at the top or  bottom of the stairs. If you do have throw rugs, attach them to the floor with carpet tape. ?Make sure that you have a light switch at the top of the stairs and the bottom of the stairs. If you do not have them, a

## 2021-07-13 NOTE — Progress Notes (Signed)
? ?Subjective:  ? Tara [name] is a 56 y.o. female who presents for Medicare Annual (Subsequent) preventive examination. ? ?Virtual Visit via Telephone Note ? ?I connected with  Tara [name] on 07/13/21 at 11:15 AM EDT by telephone and verified that I am speaking with the correct person using two identifiers. ? ?Location: ?Patient: home ?Provider: [place] ?Persons participating in the virtual visit: patient/Nurse Health Advisor ?  ?I discussed the limitations, risks, security and privacy concerns of performing an evaluation and management service by telephone and the availability of in person appointments. The patient expressed understanding and agreed to proceed. ? ?Interactive audio and video telecommunications were attempted between this nurse and patient, however failed, due to patient having technical difficulties OR patient did not have access to video capability.  We continued and completed visit with audio only. ? ?Some vital signs may be absent or patient reported.  ? ?Clemetine Marker, LPN ? ? ?Review of Systems    ? ?Cardiac Risk Factors include: smoking/ tobacco exposure ? ?   ?Objective:  ?  ?Today's Vitals  ? 07/13/21 1120  ?PainSc: 7   ? ?There is no height or weight on file to calculate BMI. ? ? ?  07/13/2021  ? 11:24 AM 05/31/2020  ? 10:10 AM 11/05/2019  ?  1:58 AM 05/26/2019  ? 11:03 AM 05/20/2018  ? 10:52 AM 06/04/2017  ? 11:29 AM 05/17/2017  ? 10:58 AM  ?Advanced Directives  ?Does Patient Have a Medical Advance Directive? No No No No No No No  ?Would patient like information on creating a medical advance directive? Yes (MAU/Ambulatory/Procedural Areas - Information given) No - Patient declined  Yes (MAU/Ambulatory/Procedural Areas - Information given) No - Patient declined No - Patient declined Yes (MAU/Ambulatory/Procedural Areas - Information given)  ? ? ?Current Medications (verified) ?Outpatient Encounter Medications as of 07/13/2021  ?Medication Sig  ? Calcium 600-200 MG-UNIT tablet Take 1 tablet by  mouth daily.  ? doxepin (SINEQUAN) 25 MG capsule Take 1 capsule (25 mg total) by mouth at bedtime.  ? esomeprazole (NEXIUM) 40 MG capsule TAKE ONE CAPSULE BY MOUTH TWICE DAILY  ? eszopiclone (LUNESTA) 2 MG TABS tablet Take 1 tablet (2 mg total) by mouth at bedtime as needed for sleep. Take immediately before bedtime  ? famotidine (PEPCID) 20 MG tablet Take 1 tablet (20 mg total) by mouth 2 (two) times daily.  ? Fluticasone-Umeclidin-Vilant (TRELEGY ELLIPTA) 100-62.5-25 MCG/INH AEPB Inhale 1 puff into the lungs daily.  ? hydrOXYzine (VISTARIL) 25 MG capsule Take 1 capsule (25 mg total) by mouth every 8 (eight) hours as needed.  ? pregabalin (LYRICA) 75 MG capsule Take 1 capsule (75 mg total) by mouth 3 (three) times daily.  ? venlafaxine XR (EFFEXOR-XR) 150 MG 24 hr capsule TAKE 1 CAPSULE(150 MG) BY MOUTH DAILY WITH BREAKFAST  ? ?No facility-administered encounter medications on file as of 07/13/2021.  ? ? ?Allergies (verified) ?Aspirin and Clonidine derivatives  ? ?History: ?Past Medical History:  ?Diagnosis Date  ? Anxiety   ? Arthritis   ? arthritis in back  ? Barrett's esophagus   ? COPD (chronic obstructive pulmonary disease) ([place])   ? uses spiriva  ? CPRS 1 (complex regional pain syndrome I) of upper limb 2007  ? formerly called rsd  ? Depression   ? being treated for depression  ? GERD (gastroesophageal reflux disease)   ? H/O degenerative disc disease   ? chronic back pain  ? Headache(784.0)   ? headaches due to  pain  ? Neuromuscular disorder ([place])   ? rsd  ? Pneumonia   ? 2011 - hospitalized for 4 days  ? Pneumonia   ? Reflex sympathetic dystrophy   ? Tobacco abuse   ? ?Past Surgical History:  ?Procedure Laterality Date  ? ABDOMINAL HYSTERECTOMY  2005  ? partial  ? CERVICAL DISC ARTHROPLASTY  2008  ? ESOPHAGOGASTRODUODENOSCOPY (EGD) WITH PROPOFOL N/A 06/04/2017  ? Procedure: ESOPHAGOGASTRODUODENOSCOPY (EGD) WITH PROPOFOL;  Surgeon: Lollie Sails, MD;  Location: [facility];  Service: Endoscopy;   Laterality: N/A;  ? KNEE ARTHROSCOPY Right 2008  ? LUMBAR LAMINECTOMY/DECOMPRESSION MICRODISCECTOMY  01/25/2011  ? Procedure: LUMBAR LAMINECTOMY/DECOMPRESSION MICRODISCECTOMY;  Surgeon: Dahlia Bailiff;  Location: [facility];  Service: Orthopedics;  Laterality: Left;  LEFT L5-S1 MICRODISCECTOMY, Central Decompression Lumbar five-sacral one  ? OOPHORECTOMY Left 2011  ? OOPHORECTOMY Right 10/2012  ? SPINAL CORD STIMULATOR IMPLANT  2009  ? Removal-01/2012  ? SPINAL CORD STIMULATOR REMOVAL  01/17/2012  ? Procedure: LUMBAR SPINAL CORD STIMULATOR REMOVAL;  Surgeon: Melina Schools, MD;  Location: [place] [facility];  Service: Orthopedics;  Laterality: N/A;  Spinal Cord Battery Removal  ? TUBAL LIGATION  1988  ? ?Family History  ?Problem Relation Age of Onset  ? Depression Mother   ? Ovarian cancer Mother   ? Hypertension Mother   ? Hypercholesterolemia Mother   ? Obesity Mother   ? Hypertension Sister   ? Obesity Sister   ? Stroke Sister   ? Healthy Sister   ? Healthy Daughter   ? Tongue cancer Daughter   ? Healthy Daughter   ? Breast cancer Neg Hx   ? ?Social History  ? ?Socioeconomic History  ? Marital status: Divorced  ?  Spouse name: Not on file  ? Number of children: 2  ? Years of education: Not on file  ? Highest education level: 12th grade  ?Occupational History  ?  Employer: UNEMPLOYED  ?  Employer: DISABLED  ?Tobacco Use  ? Smoking status: Former  ?  Packs/day: 1.00  ?  Years: 30.00  ?  Pack years: 30.00  ?  Types: Cigarettes  ?  Start date: 09/10/1986  ?  Quit date: 2018  ?  Years since quitting: 5.3  ? Smokeless tobacco: Never  ?Vaping Use  ? Vaping Use: Every day  ? Start date: 12/12/2016  ? Substances: Nicotine  ?Substance and Sexual Activity  ? Alcohol use: No  ?  Alcohol/week: 0.0 standard drinks  ? Drug use: No  ? Sexual activity: Yes  ?  Partners: Male  ?  Birth control/protection: Post-menopausal  ?Other Topics Concern  ? Not on file  ?Social History Narrative  ? Lives with long term boyfriend  ? ?Social Determinants of Health   ? ?Financial Resource Strain: Low Risk   ? Difficulty of Paying Living Expenses: Not hard at all  ?Food Insecurity: No Food Insecurity  ? Worried About Charity fundraiser in the Last Year: Never true  ? Ran Out of Food in the Last Year: Never true  ?Transportation Needs: No Transportation Needs  ? Lack of Transportation (Medical): No  ? Lack of Transportation (Non-Medical): No  ?Physical Activity: Inactive  ? Days of Exercise per Week: 0 days  ? Minutes of Exercise per Session: 0 min  ?Stress: Stress Concern Present  ? Feeling of Stress : To some extent  ?Social Connections: Moderately Isolated  ? Frequency of Communication with Friends and Family: More than three times a week  ? Frequency  of Social Gatherings with Friends and Family: More than three times a week  ? Attends Religious Services: Never  ? Active Member of Clubs or Organizations: No  ? Attends Archivist Meetings: Never  ? Marital Status: Living with partner  ? ? ?Tobacco Counseling ?Counseling given: Not Answered ? ? ?Clinical Intake: ? ?Pre-visit preparation completed: Yes ? ?Pain : 0-10 ?Pain Score: 7  ?Pain Type: Chronic pain ?Pain Location: Back ?Pain Orientation: Lower ?Pain Descriptors / Indicators: Aching, Sore, Discomfort ?Pain Onset: More than a month ago ?Pain Frequency: Constant ? ?  ? ?Nutritional Risks: None ?Diabetes: No ? ?How often do you need to have someone help you when you read instructions, pamphlets, or other written materials from your doctor or pharmacy?: 1 - Never ? ? ? ?Interpreter Needed?: No ? ?Information entered by :: Clemetine Marker LPN ? ? ?Activities of Daily Living ? ?  07/13/2021  ? 11:25 AM 07/13/2021  ?  9:49 AM  ?In your present state of health, do you have any difficulty performing the following activities:  ?Hearing? 0 0  ?Vision? 0 0  ?Difficulty concentrating or making decisions? 0 0  ?Walking or climbing stairs? 1 1  ?Dressing or bathing? 0 0  ?Doing errands, shopping? 0 0  ?Preparing Food and eating ? N N   ?Using the Toilet? N N  ?In the past six months, have you accidently leaked urine? N N  ?Do you have problems with loss of bowel control? N N  ?Managing your Medications? N N  ?Managing your Finances? N N

## 2021-07-14 ENCOUNTER — Other Ambulatory Visit: Payer: Self-pay
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
## 2021-07-17 ENCOUNTER — Encounter: Payer: Self-pay | Admitting: Family Medicine

## 2021-07-18 NOTE — Progress Notes (Signed)
Surgical Instructions ? ? ? Your procedure is scheduled on Thursday, May 18th, 2023. ? ? Report to [facility] Main Entrance "A" at 05:30 A.M., then check in with the Admitting office. ? Call this number if you have problems the morning of surgery: ? [phone] ? ? If you have any questions prior to your surgery date call [phone]: Open Monday-Friday 8am-4pm ? ? ? Remember: ? Do not eat after midnight the night before your surgery ? ?You may drink clear liquids until 04:30 the morning of your surgery.   ?Clear liquids allowed are: Water, Non-Citrus Juices (without pulp), Carbonated Beverages, Clear Tea, Black Coffee ONLY (NO MILK, CREAM OR POWDERED CREAMER of any kind), and Gatorade ?  ? Take these medicines the morning of surgery with A SIP OF WATER:  ? ?esomeprazole (NEXIUM) ?famotidine (PEPCID) ?venlafaxine XR (EFFEXOR-XR) ? ?If needed: ? ?diphenhydrAMINE (BENADRYL) ?Fluticasone-Umeclidin-Vilant (TRELEGY ELLIPTA) - Please bring the inhaler with you the day of surgery.   ?hydrOXYzine (VISTARIL) ?oxyCODONE (OXYCONTIN)  ?pregabalin (LYRICA) ? ?As of today, STOP taking any Aspirin (unless otherwise instructed by your surgeon) Aleve, Naproxen, Ibuprofen, Motrin, Advil, Goody's, BC's, all herbal medications, fish oil, and all vitamins. ? ? ? The day of surgery: ?         ?Do not wear jewelry or makeup ?Do not wear lotions, powders, perfumes, or deodorant. ?Do not shave 48 hours prior to surgery.   ?Do not bring valuables to the hospital. ?Do not wear nail polish, gel polish, artificial nails, or any other type of covering on natural nails (fingers and toes) ?If you have artificial nails or gel coating that need to be removed by a nail salon, please have this removed prior to surgery. Artificial nails or gel coating may interfere with anesthesia's ability to adequately monitor your vital signs. ? ? ?[facility] is not responsible for any belongings or valuables. .  ? ?Do NOT Smoke (Tobacco/Vaping)  24 hours prior  to your procedure ? ?If you use a CPAP at night, you may bring your mask for your overnight stay. ?  ?Contacts, glasses, hearing aids, dentures or partials may not be worn into surgery, please bring cases for these belongings ?  ?For patients admitted to the hospital, discharge time will be determined by your treatment team. ?  ?Patients discharged the day of surgery will not be allowed to drive home, and someone needs to stay with them for 24 hours. ? ? ?SURGICAL WAITING ROOM VISITATION ?Patients having surgery or a procedure in a hospital may have two support people. ?Children under the age of 27 must have an adult with them who is not the patient. ?They may stay in the waiting area during the procedure and may switch out with other visitors. If the patient needs to stay at the hospital during part of their recovery, the visitor guidelines for inpatient rooms apply. ? ?Please refer to the [place] website for the visitor guidelines for Inpatients (after your surgery is over and you are in a regular room).  ? ? ?Special instructions:   ? ?Oral Hygiene is also important to reduce your risk of infection.  Remember - BRUSH YOUR TEETH THE MORNING OF SURGERY WITH YOUR REGULAR TOOTHPASTE ? ? ?[facility]- Preparing For Surgery ? ?Before surgery, you can play an important role. Because skin is not sterile, your skin needs to be as free of germs as possible. You can reduce the number of germs on your skin by washing with CHG (chlorahexidine gluconate) Soap  before surgery.  CHG is an antiseptic cleaner which kills germs and bonds with the skin to continue killing germs even after washing.   ? ? ?Please do not use if you have an allergy to CHG or antibacterial soaps. If your skin becomes reddened/irritated stop using the CHG.  ?Do not shave (including legs and underarms) for at least 48 hours prior to first CHG shower. It is OK to shave your face. ? ?Please follow these instructions carefully. ?  ? ? Shower the NIGHT  BEFORE SURGERY and the MORNING OF SURGERY with CHG Soap.  ? If you chose to wash your hair, wash your hair first as usual with your normal shampoo. After you shampoo, rinse your hair and body thoroughly to remove the shampoo.  Then [employer] and genitals (private parts) with your normal soap and rinse thoroughly to remove soap. ? ?After that Use CHG Soap as you would any other liquid soap. You can apply CHG directly to the skin and wash gently with a scrungie or a clean washcloth.  ? ?Apply the CHG Soap to your body ONLY FROM THE NECK DOWN.  Do not use on open wounds or open sores. Avoid contact with your eyes, ears, mouth and genitals (private parts). Wash Face and genitals (private parts)  with your normal soap.  ? ?Wash thoroughly, paying special attention to the area where your surgery will be performed. ? ?Thoroughly rinse your body with warm water from the neck down. ? ?DO NOT shower/wash with your normal soap after using and rinsing off the CHG Soap. ? ?Pat yourself dry with a CLEAN TOWEL. ? ?Wear CLEAN PAJAMAS to bed the night before surgery ? ?Place CLEAN SHEETS on your bed the night before your surgery ? ?DO NOT SLEEP WITH PETS. ? ? ?Day of Surgery: ? ?Take a shower with CHG soap. ?Wear Clean/Comfortable clothing the morning of surgery ?Do not apply any deodorants/lotions.   ?Remember to brush your teeth WITH YOUR REGULAR TOOTHPASTE. ? ? ? ?If you received a COVID test during your pre-op visit, it is requested that you wear a mask when out in public, stay away from anyone that may not be feeling well, and notify your surgeon if you develop symptoms. If you have been in contact with anyone that has tested positive in the last 10 days, please notify your surgeon. ? ?  ?Please read over the following fact sheets that you were given.   ?

## 2021-07-19 ENCOUNTER — Encounter (HOSPITAL_COMMUNITY): Payer: Self-pay

## 2021-07-19 ENCOUNTER — Encounter (HOSPITAL_COMMUNITY)
Admission: RE | Admit: 2021-07-19 | Discharge: 2021-07-19 | Disposition: A | Discharge status: Home / Self Care | Payer: Medicare PPO | Source: Ambulatory Visit | Attending: Orthopedic Surgery | Admitting: Orthopedic Surgery

## 2021-07-19 ENCOUNTER — Other Ambulatory Visit: Payer: Self-pay

## 2021-07-19 VITALS — BP 110/80 | HR 86 | Temp 97.9°F | Resp 18 | Ht 69.0 in | Wt 165.5 lb

## 2021-07-19 DIAGNOSIS — Z01812 Encounter for preprocedural laboratory examination: Secondary | ICD-10-CM | POA: Diagnosis not present

## 2021-07-19 DIAGNOSIS — Z01818 Encounter for other preprocedural examination: Secondary | ICD-10-CM

## 2021-07-19 LAB — TYPE AND SCREEN
ABO/RH(D): A POS
Antibody Screen: NEGATIVE

## 2021-07-19 LAB — BASIC METABOLIC PANEL
Anion gap: 7 (ref 5–15)
BUN: 13 mg/dL (ref 6–20)
CO2: 26 mmol/L (ref 22–32)
Calcium: 9.3 mg/dL (ref 8.9–10.3)
Chloride: 101 mmol/L (ref 98–111)
Creatinine, Ser: 1.08 mg/dL — ABNORMAL HIGH (ref 0.44–1.00)
GFR, Estimated: 60 mL/min — ABNORMAL LOW (ref >60)
Glucose, Bld: 98 mg/dL (ref 70–99)
Potassium: 4.7 mmol/L (ref 3.5–5.1)
Sodium: 134 mmol/L — ABNORMAL LOW (ref 135–145)

## 2021-07-19 LAB — CBC
HCT: 37.9 % (ref 36.0–46.0)
Hemoglobin: 12.9 g/dL (ref 12.0–15.0)
MCH: 31.5 pg (ref 26.0–34.0)
MCHC: 34 g/dL (ref 30.0–36.0)
MCV: 92.4 fL (ref 80.0–100.0)
Platelets: 358 10*3/uL (ref 150–400)
RBC: 4.1 MIL/uL (ref 3.87–5.11)
RDW: 12.3 % (ref 11.5–15.5)
WBC: 10.4 10*3/uL (ref 4.0–10.5)
nRBC: 0 % (ref 0.0–0.2)

## 2021-07-19 LAB — SURGICAL PCR SCREEN
MRSA, PCR: NEGATIVE
Staphylococcus aureus: POSITIVE — AB

## 2021-07-19 NOTE — Progress Notes (Addendum)
PCP - Alba Cory MD ?Cardiologist - denies ? ?PPM/ICD - denies ?Device Orders -  ?Rep Notified -  ? ?Chest x-ray - no ?EKG - 06/22/21 ?Stress Test - denies ?ECHO - denies ?Cardiac Cath - denies ? ?Sleep Study - denies ?CPAP -  ? ?Fasting Blood Sugar - na ?Checks Blood Sugar _____ times a day ? ?Blood Thinner Instructions:na ?Aspirin Instructions:na ? ?ERAS Protcol -clear liquids until 0430 ?PRE-SURGERY Ensure or G2- no ? ?COVID TEST- na ? ? ?Anesthesia review: yes-provider preference ? ?Patient denies shortness of breath, fever, cough and chest pain at PAT appointment ? ? ?All instructions explained to the patient, with a verbal understanding of the material. Patient agrees to go over the instructions while at home for a better understanding. Patient also instructed to wear a mask when out in public prior to surgery. The opportunity to ask questions was provided. ?  ?

## 2021-07-21 DIAGNOSIS — M5451 Vertebrogenic low back pain: Secondary | ICD-10-CM | POA: Diagnosis not present

## 2021-07-24 ENCOUNTER — Encounter: Payer: Self-pay | Admitting: Family Medicine

## 2021-07-24 ENCOUNTER — Ambulatory Visit: Payer: Medicare PPO | Admitting: Family Medicine

## 2021-07-24 ENCOUNTER — Other Ambulatory Visit: Payer: Self-pay | Admitting: Family Medicine

## 2021-07-24 VITALS — BP 124/80 | HR 87 | Temp 98.5°F | Resp 16 | Ht 69.0 in | Wt 167.4 lb

## 2021-07-24 DIAGNOSIS — K219 Gastro-esophageal reflux disease without esophagitis: Secondary | ICD-10-CM

## 2021-07-24 DIAGNOSIS — I700 Atherosclerosis of aorta: Secondary | ICD-10-CM | POA: Diagnosis not present

## 2021-07-24 DIAGNOSIS — G4700 Insomnia, unspecified: Secondary | ICD-10-CM

## 2021-07-24 DIAGNOSIS — F5104 Psychophysiologic insomnia: Secondary | ICD-10-CM

## 2021-07-24 DIAGNOSIS — G9050 Complex regional pain syndrome I, unspecified: Secondary | ICD-10-CM | POA: Diagnosis not present

## 2021-07-24 DIAGNOSIS — Z13820 Encounter for screening for osteoporosis: Secondary | ICD-10-CM

## 2021-07-24 DIAGNOSIS — J449 Chronic obstructive pulmonary disease, unspecified: Secondary | ICD-10-CM | POA: Diagnosis not present

## 2021-07-24 DIAGNOSIS — F325 Major depressive disorder, single episode, in full remission: Secondary | ICD-10-CM | POA: Diagnosis not present

## 2021-07-24 DIAGNOSIS — D692 Other nonthrombocytopenic purpura: Secondary | ICD-10-CM | POA: Diagnosis not present

## 2021-07-24 MED ORDER — ESZOPICLONE 3 MG PO TABS: 3.0000 mg | ORAL_TABLET | Freq: Every day | ORAL | 0 refills | Status: DC

## 2021-07-24 MED ORDER — ESOMEPRAZOLE MAGNESIUM 40 MG PO CPDR: 40.0000 mg | DELAYED_RELEASE_CAPSULE | Freq: Two times a day (BID) | ORAL | 1 refills | Status: DC

## 2021-07-24 MED ORDER — ROSUVASTATIN CALCIUM 10 MG PO TABS: 10.0000 mg | ORAL_TABLET | Freq: Every day | ORAL | 3 refills | Status: DC

## 2021-07-24 MED ORDER — VENLAFAXINE HCL ER 150 MG PO CP24: ORAL_CAPSULE | ORAL | 1 refills | Status: DC

## 2021-07-24 NOTE — Progress Notes (Signed)
[facility] [place] ? ?ASSESSMENT / PLAN: ?Tara [name] is a 56 y.o. female with plans to undergo L5/S1 anterior lumbar interbody fusion on 07/27/21 with Dr. Rolena Infante. I have reviewed the patient's imaging and feel it is safe to approach this anteriorly. I discussed the specific risks associated with spine exposure including vascular injury, urethral injury, bowel injury, nervous injury. I explained that if severe vascular injury was encountered we may need to abandon the spinal procedure to safely repair the injury. I explained the typical postoperative course for anterior exposure of the spine, including small risk of postoperative ileus. The patient was understanding and wished to proceed.  ? ? ?CHIEF COMPLAINT: back pain with radiculopathy ? ?HISTORY OF PRESENT ILLNESS: ?Tara [name] is a 56 y.o. female for to clinic to discuss candidacy for anterior spine exposure for L5/anterior lumbar interbody fusion with Dr. Rolena Infante on 07/27/2021.  Patient has no abdominal surgical history.  She is no personal history of external beam radiation to the pelvis.  The bulk of our visit was spent discussing the risk, benefits, and alternatives to anterior approach to the L5/S1 spine.   ? ?Past Medical History:  ?Diagnosis Date  ? Anxiety   ? Arthritis   ? arthritis in back  ? Barrett's esophagus   ? COPD (chronic obstructive pulmonary disease) ([place])   ? uses spiriva  ? CPRS 1 (complex regional pain syndrome I) of upper limb 2007  ? formerly called rsd  ? Depression   ? being treated for depression  ? GERD (gastroesophageal reflux disease)   ? H/O degenerative disc disease   ? chronic back pain  ? Headache(784.0)   ? headaches due to pain  ? Neuromuscular disorder ([place])   ? rsd  ? Pneumonia   ? 2011 - hospitalized for 4 days  ? Pneumonia   ? Reflex sympathetic dystrophy   ? Tobacco abuse   ? ? ?Past Surgical History:  ?Procedure Laterality Date  ? ABDOMINAL HYSTERECTOMY  2005  ? partial  ? CERVICAL DISC  ARTHROPLASTY  2008  ? ESOPHAGOGASTRODUODENOSCOPY (EGD) WITH PROPOFOL N/A 06/04/2017  ? Procedure: ESOPHAGOGASTRODUODENOSCOPY (EGD) WITH PROPOFOL;  Surgeon: Lollie Sails, MD;  Location: [facility] ENDOSCOPY;  Service: Endoscopy;  Laterality: N/A;  ? KNEE ARTHROSCOPY Right 2008  ? LUMBAR LAMINECTOMY/DECOMPRESSION MICRODISCECTOMY  01/25/2011  ? Procedure: LUMBAR LAMINECTOMY/DECOMPRESSION MICRODISCECTOMY;  Surgeon: Dahlia Bailiff;  Location: [facility];  Service: Orthopedics;  Laterality: Left;  LEFT L5-S1 MICRODISCECTOMY, Central Decompression Lumbar five-sacral one  ? OOPHORECTOMY Left 2011  ? OOPHORECTOMY Right 10/2012  ? SPINAL CORD STIMULATOR IMPLANT  2009  ? Removal-01/2012  ? SPINAL CORD STIMULATOR REMOVAL  01/17/2012  ? Procedure: LUMBAR SPINAL CORD STIMULATOR REMOVAL;  Surgeon: Melina Schools, MD;  Location: [facility];  Service: Orthopedics;  Laterality: N/A;  Spinal Cord Battery Removal  ? TUBAL LIGATION  1988  ? ? ?Family History  ?Problem Relation Age of Onset  ? Depression Mother   ? Ovarian cancer Mother   ? Hypertension Mother   ? Hypercholesterolemia Mother   ? Obesity Mother   ? Hypertension Sister   ? Obesity Sister   ? Stroke Sister   ? Healthy Sister   ? Healthy Daughter   ? Tongue cancer Daughter   ? Healthy Daughter   ? Breast cancer Neg Hx   ? ? ?Social History  ? ?Socioeconomic History  ? Marital status: Divorced  ?  Spouse name: Not on file  ? Number of children:  2  ? Years of education: Not on file  ? Highest education level: 12th grade  ?Occupational History  ?  Employer: UNEMPLOYED  ?  Employer: DISABLED  ?Tobacco Use  ? Smoking status: Former  ?  Packs/day: 1.00  ?  Years: 30.00  ?  Pack years: 30.00  ?  Types: Cigarettes  ?  Start date: 09/10/1986  ?  Quit date: 2018  ?  Years since quitting: 5.3  ? Smokeless tobacco: Never  ?Vaping Use  ? Vaping Use: Every day  ? Start date: 12/12/2016  ? Substances: Nicotine  ?Substance and Sexual Activity  ? Alcohol use: No  ?  Alcohol/week: 0.0 standard drinks  ?  Drug use: No  ? Sexual activity: Yes  ?  Partners: Male  ?  Birth control/protection: Post-menopausal  ?Other Topics Concern  ? Not on file  ?Social History Narrative  ? Lives with long term boyfriend  ? ?Social Determinants of Health  ? ?Financial Resource Strain: Low Risk   ? Difficulty of Paying Living Expenses: Not hard at all  ?Food Insecurity: No Food Insecurity  ? Worried About Charity fundraiser in the Last Year: Never true  ? Ran Out of Food in the Last Year: Never true  ?Transportation Needs: No Transportation Needs  ? Lack of Transportation (Medical): No  ? Lack of Transportation (Non-Medical): No  ?Physical Activity: Inactive  ? Days of Exercise per Week: 0 days  ? Minutes of Exercise per Session: 0 min  ?Stress: Stress Concern Present  ? Feeling of Stress : To some extent  ?Social Connections: Moderately Isolated  ? Frequency of Communication with Friends and Family: More than three times a week  ? Frequency of Social Gatherings with Friends and Family: More than three times a week  ? Attends Religious Services: Never  ? Active Member of Clubs or Organizations: No  ? Attends Archivist Meetings: Never  ? Marital Status: Living with partner  ?Intimate Partner Violence: Not At Risk  ? Fear of Current or Ex-Partner: No  ? Emotionally Abused: No  ? Physically Abused: No  ? Sexually Abused: No  ? ? ?Allergies  ?Allergen Reactions  ? Aspirin Other (See Comments)  ?  Causes heartburn even the chewable kind  ? Clonidine Derivatives Itching  ? ? ?Current Outpatient Medications  ?Medication Sig Dispense Refill  ? Calcium 600-200 MG-UNIT tablet Take 1 tablet by mouth 2 (two) times daily.    ? diclofenac Sodium (VOLTAREN) 1 % GEL Apply 1 application. topically 3 (three) times daily as needed (back pain.).    ? esomeprazole (NEXIUM) 40 MG capsule Take 1 capsule (40 mg total) by mouth 2 (two) times daily. 180 capsule 1  ? eszopiclone 3 MG TABS Take 1 tablet (3 mg total) by mouth at bedtime. Take  immediately before bedtime 90 tablet 0  ? famotidine (PEPCID) 20 MG tablet Take 1 tablet (20 mg total) by mouth 2 (two) times daily. (Patient taking differently: Take 20 mg by mouth in the morning, at noon, and at bedtime.) 180 tablet 1  ? Fluticasone-Umeclidin-Vilant (TRELEGY ELLIPTA) 100-62.5-25 MCG/INH AEPB Inhale 1 puff into the lungs daily. (Patient taking differently: Inhale 1 puff into the lungs daily as needed (respiratory issues.).) 180 each 0  ? mupirocin ointment (BACTROBAN) 2 % Apply topically.    ? oxyCODONE (OXYCONTIN) 20 mg 12 hr tablet Take 20 mg by mouth every 12 (twelve) hours as needed (pain.).    ? pregabalin (LYRICA) 75  MG capsule Take 1 capsule (75 mg total) by mouth 3 (three) times daily. (Patient taking differently: Take 75 mg by mouth 3 (three) times daily as needed (pain.).) 270 capsule 0  ? rosuvastatin (CRESTOR) 10 MG tablet Take 1 tablet (10 mg total) by mouth daily. 90 tablet 3  ? venlafaxine XR (EFFEXOR-XR) 150 MG 24 hr capsule TAKE 1 CAPSULE(150 MG) BY MOUTH DAILY WITH BREAKFAST 90 capsule 1  ? ?No current facility-administered medications for this visit.  ? ? ?PHYSICAL EXAM ?Vitals:  ? 07/25/21 1323  ?BP: 115/74  ?Pulse: 94  ?Resp: 20  ?Temp: 98.4 ?F (36.9 ?C)  ?SpO2: 96%  ?Weight: 166 lb (75.3 kg)  ?Height: 5\' 9"  (1.753 m)  ? ? ?Constitutional: Well-appearing woman in no acute distress ?Cardiac: Regular rate and rhythm.  ?Respiratory:  unlabored. ?Abdominal:  soft, non-tender, non-distended.  ? ?PERTINENT LABORATORY AND RADIOLOGIC DATA ? ?Most recent CBC ? ?  Latest Ref Rng & Units 07/19/2021  ?  2:41 PM 06/22/2021  ?  3:40 PM 09/22/2020  ? 12:08 PM  ?CBC  ?WBC 4.0 - 10.5 K/uL 10.4   8.2   6.9    ?Hemoglobin 12.0 - 15.0 g/dL 12.9   13.4   12.5    ?Hematocrit 36.0 - 46.0 % 37.9   39.8   37.8    ?Platelets 150 - 400 K/uL 358   337   329    ?  ? ?Most recent CMP ? ?  Latest Ref Rng & Units 07/19/2021  ?  2:41 PM 06/22/2021  ?  3:40 PM 09/22/2020  ? 12:08 PM  ?CMP  ?Glucose 70 - 99 mg/dL 98    93   76    ?BUN 6 - 20 mg/dL 13   13   17     ?Creatinine 0.44 - 1.00 mg/dL 1.08   0.85   0.92    ?Sodium 135 - 145 mmol/L 134   132   137    ?Potassium 3.5 - 5.1 mmol/L 4.7   5.4   4.9    ?Chloride 98 - 111 mmol

## 2021-07-24 NOTE — H&P (View-Only) (Signed)
[facility] [place]  ASSESSMENT / PLAN: Tara [name] is a 56 y.o. female with plans to undergo L5/S1 anterior lumbar interbody fusion on 07/27/21 with Dr. Rolena Infante. I have reviewed the patient's imaging and feel it is safe to approach this anteriorly. I discussed the specific risks associated with spine exposure including vascular injury, urethral injury, bowel injury, nervous injury. I explained that if severe vascular injury was encountered we may need to abandon the spinal procedure to safely repair the injury. I explained the typical postoperative course for anterior exposure of the spine, including small risk of postoperative ileus. The patient was understanding and wished to proceed.    CHIEF COMPLAINT: back pain with radiculopathy  HISTORY OF PRESENT ILLNESS: Tara [name] is a 56 y.o. female for to clinic to discuss candidacy for anterior spine exposure for L5/anterior lumbar interbody fusion with Dr. Rolena Infante on 07/27/2021.  Patient has no abdominal surgical history.  She is no personal history of external beam radiation to the pelvis.  The bulk of our visit was spent discussing the risk, benefits, and alternatives to anterior approach to the L5/S1 spine.    Past Medical History:  Diagnosis Date   Anxiety    Arthritis    arthritis in back   Barrett's esophagus    COPD (chronic obstructive pulmonary disease) ([place])    uses spiriva   CPRS 1 (complex regional pain syndrome I) of upper limb 2007   formerly called rsd   Depression    being treated for depression   GERD (gastroesophageal reflux disease)    H/O degenerative disc disease    chronic back pain   Headache(784.0)    headaches due to pain   Neuromuscular disorder ([place])    rsd   Pneumonia    2011 - hospitalized for 4 days   Pneumonia    Reflex sympathetic dystrophy    Tobacco abuse     Past Surgical History:  Procedure Laterality Date   ABDOMINAL HYSTERECTOMY  2005   partial   CERVICAL [place]  ARTHROPLASTY  2008   ESOPHAGOGASTRODUODENOSCOPY (EGD) WITH PROPOFOL N/A 06/04/2017   Procedure: ESOPHAGOGASTRODUODENOSCOPY (EGD) WITH PROPOFOL;  Surgeon: Lollie Sails, MD;  Location: [facility] ENDOSCOPY;  Service: Endoscopy;  Laterality: N/A;   KNEE ARTHROSCOPY Right 2008   LUMBAR LAMINECTOMY/DECOMPRESSION MICRODISCECTOMY  01/25/2011   Procedure: LUMBAR LAMINECTOMY/DECOMPRESSION MICRODISCECTOMY;  Surgeon: Dahlia Bailiff;  Location: [place];  Service: Orthopedics;  Laterality: Left;  LEFT L5-S1 MICRODISCECTOMY, Central Decompression Lumbar five-sacral one   OOPHORECTOMY Left 2011   OOPHORECTOMY Right 10/2012   SPINAL CORD STIMULATOR IMPLANT  2009   Removal-01/2012   SPINAL CORD STIMULATOR REMOVAL  01/17/2012   Procedure: LUMBAR SPINAL CORD STIMULATOR REMOVAL;  Surgeon: Melina Schools, MD;  Location: [facility];  Service: Orthopedics;  Laterality: N/A;  Spinal Cord Battery Removal   TUBAL LIGATION  1988    Family History  Problem Relation Age of Onset   Depression Mother    Ovarian cancer Mother    Hypertension Mother    Hypercholesterolemia Mother    Obesity Mother    Hypertension Sister    Obesity Sister    Stroke Sister    Healthy Sister    Healthy Daughter    Tongue cancer Daughter    Healthy Daughter    Breast cancer Neg Hx     Social History   Socioeconomic History   Marital status: Divorced    Spouse name: Not on file   Number of children:  2   Years of education: Not on file   Highest education level: 12th grade  Occupational History    Employer: UNEMPLOYED    Employer: DISABLED  Tobacco Use   Smoking status: Former    Packs/day: 1.00    Years: 30.00    Pack years: 30.00    Types: Cigarettes    Start date: 09/10/1986    Quit date: 2018    Years since quitting: 5.3   Smokeless tobacco: Never  Vaping Use   Vaping Use: Every day   Start date: 12/12/2016   Substances: Nicotine  Substance and Sexual Activity   Alcohol use: No    Alcohol/week: 0.0 standard drinks    Drug use: No   Sexual activity: Yes    Partners: Male    Birth control/protection: Post-menopausal  Other Topics Concern   Not on file  Social History Narrative   Lives with long term boyfriend   Social Determinants of Health   Financial Resource Strain: Low Risk    Difficulty of Paying Living Expenses: Not hard at all  Food Insecurity: No Food Insecurity   Worried About Charity fundraiser in the Last Year: Never true   Arboriculturist in the Last Year: Never true  Transportation Needs: No Transportation Needs   Lack of Transportation (Medical): No   Lack of Transportation (Non-Medical): No  Physical Activity: Inactive   Days of Exercise per Week: 0 days   Minutes of Exercise per Session: 0 min  Stress: Stress Concern Present   Feeling of Stress : To some extent  Social Connections: Moderately Isolated   Frequency of Communication with Friends and Family: More than three times a week   Frequency of Social Gatherings with Friends and Family: More than three times a week   Attends Religious Services: Never   Marine scientist or Organizations: No   Attends Music therapist: Never   Marital Status: Living with partner  Intimate Partner Violence: Not At Risk   Fear of Current or Ex-Partner: No   Emotionally Abused: No   Physically Abused: No   Sexually Abused: No    Allergies  Allergen Reactions   Aspirin Other (See Comments)    Causes heartburn even the chewable kind   Clonidine Derivatives Itching    Current Outpatient Medications  Medication Sig Dispense Refill   Calcium 600-200 MG-UNIT tablet Take 1 tablet by mouth 2 (two) times daily.     diclofenac Sodium (VOLTAREN) 1 % GEL Apply 1 application. topically 3 (three) times daily as needed (back pain.).     esomeprazole (NEXIUM) 40 MG capsule Take 1 capsule (40 mg total) by mouth 2 (two) times daily. 180 capsule 1   eszopiclone 3 MG TABS Take 1 tablet (3 mg total) by mouth at bedtime. Take  immediately before bedtime 90 tablet 0   famotidine (PEPCID) 20 MG tablet Take 1 tablet (20 mg total) by mouth 2 (two) times daily. (Patient taking differently: Take 20 mg by mouth in the morning, at noon, and at bedtime.) 180 tablet 1   Fluticasone-Umeclidin-Vilant (TRELEGY ELLIPTA) 100-62.5-25 MCG/INH AEPB Inhale 1 puff into the lungs daily. (Patient taking differently: Inhale 1 puff into the lungs daily as needed (respiratory issues.).) 180 each 0   mupirocin ointment (BACTROBAN) 2 % Apply topically.     oxyCODONE (OXYCONTIN) 20 mg 12 hr tablet Take 20 mg by mouth every 12 (twelve) hours as needed (pain.).     pregabalin (LYRICA) 75  MG capsule Take 1 capsule (75 mg total) by mouth 3 (three) times daily. (Patient taking differently: Take 75 mg by mouth 3 (three) times daily as needed (pain.).) 270 capsule 0   rosuvastatin (CRESTOR) 10 MG tablet Take 1 tablet (10 mg total) by mouth daily. 90 tablet 3   venlafaxine XR (EFFEXOR-XR) 150 MG 24 hr capsule TAKE 1 CAPSULE(150 MG) BY MOUTH DAILY WITH BREAKFAST 90 capsule 1   No current facility-administered medications for this visit.    PHYSICAL EXAM Vitals:   07/25/21 1323  BP: 115/74  Pulse: 94  Resp: 20  Temp: 98.4 F (36.9 C)  SpO2: 96%  Weight: 166 lb (75.3 kg)  Height: 5\' 9"  (1.753 m)    Constitutional: Well-appearing woman in no acute distress Cardiac: Regular rate and rhythm.  Respiratory:  unlabored. Abdominal:  soft, non-tender, non-distended.   PERTINENT LABORATORY AND RADIOLOGIC DATA  Most recent CBC    Latest Ref Rng & Units 07/19/2021    2:41 PM 06/22/2021    3:40 PM 09/22/2020   12:08 PM  CBC  WBC 4.0 - 10.5 K/uL 10.4   8.2   6.9    Hemoglobin 12.0 - 15.0 g/dL 12.9   13.4   12.5    Hematocrit 36.0 - 46.0 % 37.9   39.8   37.8    Platelets 150 - 400 K/uL 358   337   329       Most recent CMP    Latest Ref Rng & Units 07/19/2021    2:41 PM 06/22/2021    3:40 PM 09/22/2020   12:08 PM  CMP  Glucose 70 - 99 mg/dL 98    93   76    BUN 6 - 20 mg/dL 13   13   17     Creatinine 0.44 - 1.00 mg/dL 1.08   0.85   0.92    Sodium 135 - 145 mmol/L 134   132   137    Potassium 3.5 - 5.1 mmol/L 4.7   5.4   4.9    Chloride 98 - 111 mmol/L 101   96   103    CO2 22 - 32 mmol/L 26   29   30     Calcium 8.9 - 10.3 mg/dL 9.3   9.7   9.3    Total Protein 6.1 - 8.1 g/dL  7.5   7.3    Total Bilirubin 0.2 - 1.2 mg/dL  0.3   0.3    AST 10 - 35 U/L  18   18    ALT 6 - 29 U/L  21   20      Renal function Estimated Creatinine Clearance: 60.1 mL/min (A) (by C-G formula based on SCr of 1.08 mg/dL (H)).  Hgb A1c MFr Bld (% of total Hgb)  Date Value  09/22/2020 5.4    LDL Cholesterol (Calc)  Date Value Ref Range Status  09/22/2020 128 (H) mg/dL (calc) Final    Comment:    Reference range: <100 . Desirable range <100 mg/dL for primary prevention;   <70 mg/dL for patients with CHD or diabetic patients  with > or = 2 CHD risk factors. Marland Kitchen LDL-C is now calculated using the Martin-Hopkins  calculation, which is a validated novel method providing  better accuracy than the Friedewald equation in the  estimation of LDL-C.  Cresenciano Genre et al. Annamaria Helling. [id number]): 2061-2068  (http://education.[employer].com/faq/FAQ164)      Vascular Imaging: CT scan of the lumbar spine personally  reviewed.  The confluence of the inferior vena cava is well above the L5/S1 disc base.Marland Kitchen  Appear to be no over laying vascular structures over the L5/S1 disc base.  Yevonne Aline. Stanford Breed, MD [facility] Phone Number: [phone] 07/25/2021 4:13 PM  Total time spent on preparing this encounter including chart review, data review, collecting history, examining the patient, coordinating care for this new patient, 60 minutes.  Portions of this report may have been transcribed using voice recognition software.  Every effort has been made to ensure accuracy; however, inadvertent computerized transcription errors may  still be present.

## 2021-07-25 ENCOUNTER — Ambulatory Visit: Payer: Medicare PPO | Admitting: Vascular Surgery

## 2021-07-25 ENCOUNTER — Encounter: Payer: Self-pay | Admitting: Vascular Surgery

## 2021-07-25 VITALS — BP 115/74 | HR 94 | Temp 98.4°F | Resp 20 | Ht 69.0 in | Wt 166.0 lb

## 2021-07-25 DIAGNOSIS — M5410 Radiculopathy, site unspecified: Secondary | ICD-10-CM | POA: Diagnosis not present

## 2021-07-26 ENCOUNTER — Other Ambulatory Visit: Payer: Self-pay | Admitting: Family Medicine

## 2021-07-26 ENCOUNTER — Encounter: Payer: Self-pay | Admitting: Family Medicine

## 2021-07-26 DIAGNOSIS — F325 Major depressive disorder, single episode, in full remission: Secondary | ICD-10-CM

## 2021-07-27 ENCOUNTER — Other Ambulatory Visit: Payer: Self-pay

## 2021-07-27 ENCOUNTER — Encounter (HOSPITAL_COMMUNITY): Payer: Self-pay | Admitting: Orthopedic Surgery

## 2021-07-27 ENCOUNTER — Inpatient Hospital Stay (HOSPITAL_COMMUNITY): Payer: Medicare PPO

## 2021-07-27 ENCOUNTER — Inpatient Hospital Stay (HOSPITAL_COMMUNITY): Payer: Medicare PPO | Admitting: Emergency Medicine

## 2021-07-27 ENCOUNTER — Inpatient Hospital Stay (HOSPITAL_COMMUNITY)
Admission: RE | Admit: 2021-07-27 | Discharge: 2021-07-29 | DRG: 455 | Disposition: A | Discharge status: Home / Self Care | Payer: Medicare PPO | Attending: Orthopedic Surgery | Admitting: Orthopedic Surgery

## 2021-07-27 ENCOUNTER — Encounter (HOSPITAL_COMMUNITY)
Admission: RE | Disposition: A | Discharge status: Home / Self Care | Payer: Self-pay | Source: Home / Self Care | Attending: Orthopedic Surgery

## 2021-07-27 ENCOUNTER — Inpatient Hospital Stay (HOSPITAL_COMMUNITY): Payer: Medicare PPO | Admitting: Anesthesiology

## 2021-07-27 DIAGNOSIS — M7989 Other specified soft tissue disorders: Secondary | ICD-10-CM | POA: Diagnosis not present

## 2021-07-27 DIAGNOSIS — Z886 Allergy status to analgesic agent status: Secondary | ICD-10-CM | POA: Diagnosis not present

## 2021-07-27 DIAGNOSIS — Z79899 Other long term (current) drug therapy: Secondary | ICD-10-CM | POA: Diagnosis not present

## 2021-07-27 DIAGNOSIS — M48062 Spinal stenosis, lumbar region with neurogenic claudication: Secondary | ICD-10-CM | POA: Diagnosis not present

## 2021-07-27 DIAGNOSIS — Z9109 Other allergy status, other than to drugs and biological substances: Secondary | ICD-10-CM

## 2021-07-27 DIAGNOSIS — G8918 Other acute postprocedural pain: Secondary | ICD-10-CM | POA: Diagnosis not present

## 2021-07-27 DIAGNOSIS — F419 Anxiety disorder, unspecified: Secondary | ICD-10-CM | POA: Diagnosis not present

## 2021-07-27 DIAGNOSIS — Z823 Family history of stroke: Secondary | ICD-10-CM

## 2021-07-27 DIAGNOSIS — M5116 Intervertebral disc disorders with radiculopathy, lumbar region: Principal | ICD-10-CM | POA: Diagnosis present

## 2021-07-27 DIAGNOSIS — M961 Postlaminectomy syndrome, not elsewhere classified: Secondary | ICD-10-CM | POA: Diagnosis not present

## 2021-07-27 DIAGNOSIS — F32A Depression, unspecified: Secondary | ICD-10-CM | POA: Diagnosis present

## 2021-07-27 DIAGNOSIS — Z981 Arthrodesis status: Principal | ICD-10-CM

## 2021-07-27 DIAGNOSIS — Z8249 Family history of ischemic heart disease and other diseases of the circulatory system: Secondary | ICD-10-CM

## 2021-07-27 DIAGNOSIS — M5137 Other intervertebral disc degeneration, lumbosacral region: Secondary | ICD-10-CM

## 2021-07-27 DIAGNOSIS — Z818 Family history of other mental and behavioral disorders: Secondary | ICD-10-CM

## 2021-07-27 DIAGNOSIS — M4807 Spinal stenosis, lumbosacral region: Secondary | ICD-10-CM | POA: Diagnosis present

## 2021-07-27 DIAGNOSIS — M5451 Vertebrogenic low back pain: Secondary | ICD-10-CM | POA: Diagnosis not present

## 2021-07-27 DIAGNOSIS — Z720 Tobacco use: Secondary | ICD-10-CM

## 2021-07-27 DIAGNOSIS — Z83438 Family history of other disorder of lipoprotein metabolism and other lipidemia: Secondary | ICD-10-CM | POA: Diagnosis not present

## 2021-07-27 DIAGNOSIS — M5417 Radiculopathy, lumbosacral region: Secondary | ICD-10-CM | POA: Diagnosis not present

## 2021-07-27 DIAGNOSIS — M4316 Spondylolisthesis, lumbar region: Secondary | ICD-10-CM | POA: Diagnosis present

## 2021-07-27 DIAGNOSIS — K2270 Barrett's esophagus without dysplasia: Secondary | ICD-10-CM | POA: Diagnosis not present

## 2021-07-27 DIAGNOSIS — Z808 Family history of malignant neoplasm of other organs or systems: Secondary | ICD-10-CM | POA: Diagnosis not present

## 2021-07-27 DIAGNOSIS — N3289 Other specified disorders of bladder: Secondary | ICD-10-CM | POA: Diagnosis present

## 2021-07-27 DIAGNOSIS — Z8041 Family history of malignant neoplasm of ovary: Secondary | ICD-10-CM

## 2021-07-27 DIAGNOSIS — K219 Gastro-esophageal reflux disease without esophagitis: Secondary | ICD-10-CM | POA: Diagnosis present

## 2021-07-27 DIAGNOSIS — G8929 Other chronic pain: Secondary | ICD-10-CM | POA: Diagnosis present

## 2021-07-27 DIAGNOSIS — Z90710 Acquired absence of both cervix and uterus: Secondary | ICD-10-CM

## 2021-07-27 DIAGNOSIS — J449 Chronic obstructive pulmonary disease, unspecified: Secondary | ICD-10-CM | POA: Diagnosis present

## 2021-07-27 DIAGNOSIS — M4326 Fusion of spine, lumbar region: Secondary | ICD-10-CM | POA: Diagnosis not present

## 2021-07-27 DIAGNOSIS — M79669 Pain in unspecified lower leg: Secondary | ICD-10-CM | POA: Diagnosis not present

## 2021-07-27 DIAGNOSIS — Z9889 Other specified postprocedural states: Secondary | ICD-10-CM | POA: Diagnosis not present

## 2021-07-27 DIAGNOSIS — M5117 Intervertebral disc disorders with radiculopathy, lumbosacral region: Secondary | ICD-10-CM | POA: Diagnosis not present

## 2021-07-27 HISTORY — PX: ABDOMINAL EXPOSURE: SHX5708

## 2021-07-27 HISTORY — PX: ANTERIOR AND POSTERIOR SPINAL FUSION: SHX2259

## 2021-07-27 LAB — CBC
HCT: 30.6 % — ABNORMAL LOW (ref 36.0–46.0)
Hemoglobin: 10.4 g/dL — ABNORMAL LOW (ref 12.0–15.0)
MCH: 31.5 pg (ref 26.0–34.0)
MCHC: 34 g/dL (ref 30.0–36.0)
MCV: 92.7 fL (ref 80.0–100.0)
Platelets: 276 10*3/uL (ref 150–400)
RBC: 3.3 MIL/uL — ABNORMAL LOW (ref 3.87–5.11)
RDW: 12.4 % (ref 11.5–15.5)
WBC: 9.7 10*3/uL (ref 4.0–10.5)
nRBC: 0 % (ref 0.0–0.2)

## 2021-07-27 LAB — CREATININE, SERUM
Creatinine, Ser: 0.79 mg/dL (ref 0.44–1.00)
GFR, Estimated: >60 mL/min (ref >60)

## 2021-07-27 LAB — ABO/RH: ABO/RH(D): A POS

## 2021-07-27 SURGERY — ANTERIOR AND POSTERIOR SPINAL FUSION
Anesthesia: General | Site: Spine Lumbar

## 2021-07-27 MED ORDER — MIDAZOLAM HCL 2 MG/2ML IJ SOLN
INTRAMUSCULAR | Status: DC | PRN
  Administered 2021-07-27: 2 mg via INTRAVENOUS

## 2021-07-27 MED ORDER — OXYCODONE HCL 5 MG PO TABS
ORAL_TABLET | ORAL | Status: AC
  Filled 2021-07-27: qty 2

## 2021-07-27 MED ORDER — CHLORHEXIDINE GLUCONATE CLOTH 2 % EX PADS: 6.0000 | MEDICATED_PAD | Freq: Once | CUTANEOUS | Status: DC

## 2021-07-27 MED ORDER — METHOCARBAMOL 1000 MG/10ML IJ SOLN
500.0000 mg | Freq: Four times a day (QID) | INTRAVENOUS | Status: DC | PRN
  Filled 2021-07-27: qty 5

## 2021-07-27 MED ORDER — SODIUM CHLORIDE 0.9 % IV SOLN: 250.0000 mL | INTRAVENOUS | Status: DC

## 2021-07-27 MED ORDER — SODIUM CHLORIDE 0.9 % IV SOLN
INTRAVENOUS | Status: DC | PRN
  Administered 2021-07-27: 1000 mg via INTRAVENOUS

## 2021-07-27 MED ORDER — ALBUMIN HUMAN 5 % IV SOLN: INTRAVENOUS | Status: DC | PRN

## 2021-07-27 MED ORDER — BUPIVACAINE-EPINEPHRINE (PF) 0.25% -1:200000 IJ SOLN
INTRAMUSCULAR | Status: DC | PRN
  Administered 2021-07-27: 20 mL

## 2021-07-27 MED ORDER — ACETAMINOPHEN 650 MG RE SUPP: 650.0000 mg | RECTAL | Status: DC | PRN

## 2021-07-27 MED ORDER — 0.9 % SODIUM CHLORIDE (POUR BTL) OPTIME
TOPICAL | Status: DC | PRN
  Administered 2021-07-27 (×3): 1000 mL

## 2021-07-27 MED ORDER — ENOXAPARIN SODIUM 40 MG/0.4ML IJ SOSY: 40.0000 mg | PREFILLED_SYRINGE | INTRAMUSCULAR | 0 refills | Status: DC

## 2021-07-27 MED ORDER — METHOCARBAMOL 500 MG PO TABS
ORAL_TABLET | ORAL | Status: AC
  Filled 2021-07-27: qty 1

## 2021-07-27 MED ORDER — SODIUM CHLORIDE 0.9% FLUSH: 3.0000 mL | INTRAVENOUS | Status: DC | PRN

## 2021-07-27 MED ORDER — ACETAMINOPHEN 500 MG PO TABS
1000.0000 mg | ORAL_TABLET | Freq: Once | ORAL | Status: AC
  Administered 2021-07-27: 1000 mg via ORAL
  Filled 2021-07-27: qty 2

## 2021-07-27 MED ORDER — SUGAMMADEX SODIUM 200 MG/2ML IV SOLN
INTRAVENOUS | Status: DC | PRN
Start: 2021-07-27 — End: 2021-07-27
  Administered 2021-07-27: 200 mg via INTRAVENOUS

## 2021-07-27 MED ORDER — UMECLIDINIUM BROMIDE 62.5 MCG/ACT IN AEPB
1.0000 | INHALATION_SPRAY | Freq: Every day | RESPIRATORY_TRACT | Status: DC
  Filled 2021-07-27 (×2): qty 7

## 2021-07-27 MED ORDER — TRANEXAMIC ACID-NACL 1000-0.7 MG/100ML-% IV SOLN
1000.0000 mg | INTRAVENOUS | Status: DC
  Filled 2021-07-27: qty 100

## 2021-07-27 MED ORDER — SORBITOL 70 % SOLN
30.0000 mL | Freq: Every day | Status: DC | PRN
  Administered 2021-07-28 (×2): 30 mL via ORAL
  Filled 2021-07-27 (×2): qty 30

## 2021-07-27 MED ORDER — THROMBIN 20000 UNITS EX SOLR
CUTANEOUS | Status: AC
  Filled 2021-07-27: qty 20000

## 2021-07-27 MED ORDER — PROPOFOL 10 MG/ML IV BOLUS
INTRAVENOUS | Status: DC | PRN
  Administered 2021-07-27: 130 mg via INTRAVENOUS

## 2021-07-27 MED ORDER — PANTOPRAZOLE SODIUM 40 MG PO TBEC
40.0000 mg | DELAYED_RELEASE_TABLET | Freq: Every day | ORAL | Status: DC
  Administered 2021-07-27 – 2021-07-29 (×3): 40 mg via ORAL
  Filled 2021-07-27 (×3): qty 1

## 2021-07-27 MED ORDER — METHOCARBAMOL 500 MG PO TABS: 500.0000 mg | ORAL_TABLET | Freq: Three times a day (TID) | ORAL | 0 refills | Status: AC | PRN

## 2021-07-27 MED ORDER — PREGABALIN 75 MG PO CAPS
75.0000 mg | ORAL_CAPSULE | Freq: Three times a day (TID) | ORAL | Status: DC | PRN
  Administered 2021-07-28: 75 mg via ORAL
  Filled 2021-07-27: qty 1

## 2021-07-27 MED ORDER — ONDANSETRON HCL 4 MG PO TABS: 4.0000 mg | ORAL_TABLET | Freq: Four times a day (QID) | ORAL | Status: DC | PRN

## 2021-07-27 MED ORDER — SUFENTANIL CITRATE 50 MCG/ML IV SOLN
INTRAVENOUS | Status: AC
  Filled 2021-07-27: qty 1

## 2021-07-27 MED ORDER — DEXAMETHASONE 4 MG PO TABS
4.0000 mg | ORAL_TABLET | Freq: Four times a day (QID) | ORAL | Status: DC
  Administered 2021-07-27 – 2021-07-29 (×6): 4 mg via ORAL
  Filled 2021-07-27 (×6): qty 1

## 2021-07-27 MED ORDER — ORAL CARE MOUTH RINSE: 15.0000 mL | Freq: Once | OROMUCOSAL | Status: AC

## 2021-07-27 MED ORDER — PHENOL 1.4 % MT LIQD
1.0000 | OROMUCOSAL | Status: DC | PRN
  Administered 2021-07-29: 1 via OROMUCOSAL

## 2021-07-27 MED ORDER — ROSUVASTATIN CALCIUM 5 MG PO TABS
10.0000 mg | ORAL_TABLET | Freq: Every day | ORAL | Status: DC
  Administered 2021-07-27 – 2021-07-29 (×3): 10 mg via ORAL
  Filled 2021-07-27 (×3): qty 2

## 2021-07-27 MED ORDER — BUPIVACAINE LIPOSOME 1.3 % IJ SUSP
INTRAMUSCULAR | Status: DC | PRN
  Administered 2021-07-27: 10 mL

## 2021-07-27 MED ORDER — LIDOCAINE 2% (20 MG/ML) 5 ML SYRINGE
INTRAMUSCULAR | Status: DC | PRN
  Administered 2021-07-27: 80 mg via INTRAVENOUS

## 2021-07-27 MED ORDER — POLYETHYLENE GLYCOL 3350 17 G PO PACK: 17.0000 g | PACK | Freq: Every day | ORAL | Status: DC | PRN

## 2021-07-27 MED ORDER — FLEET ENEMA 7-19 GM/118ML RE ENEM: 1.0000 | ENEMA | Freq: Once | RECTAL | Status: DC | PRN

## 2021-07-27 MED ORDER — METHOCARBAMOL 500 MG PO TABS
500.0000 mg | ORAL_TABLET | Freq: Four times a day (QID) | ORAL | Status: DC | PRN
  Administered 2021-07-27 – 2021-07-29 (×7): 500 mg via ORAL
  Filled 2021-07-27 (×6): qty 1

## 2021-07-27 MED ORDER — LACTATED RINGERS IV SOLN: INTRAVENOUS | Status: DC

## 2021-07-27 MED ORDER — KETAMINE HCL 50 MG/ML IJ SOLN
INTRAMUSCULAR | Status: DC | PRN
  Administered 2021-07-27: 20 mg via INTRAMUSCULAR
  Administered 2021-07-27 (×3): 10 mg via INTRAMUSCULAR

## 2021-07-27 MED ORDER — MENTHOL 3 MG MT LOZG: 1.0000 | LOZENGE | OROMUCOSAL | Status: DC | PRN

## 2021-07-27 MED ORDER — DEXAMETHASONE SODIUM PHOSPHATE 4 MG/ML IJ SOLN
4.0000 mg | Freq: Four times a day (QID) | INTRAMUSCULAR | Status: DC
  Administered 2021-07-28 (×2): 4 mg via INTRAVENOUS
  Filled 2021-07-27 (×2): qty 1

## 2021-07-27 MED ORDER — SENNOSIDES-DOCUSATE SODIUM 8.6-50 MG PO TABS
1.0000 | ORAL_TABLET | Freq: Two times a day (BID) | ORAL | Status: DC
  Administered 2021-07-27 – 2021-07-29 (×4): 1 via ORAL
  Filled 2021-07-27 (×4): qty 1

## 2021-07-27 MED ORDER — CEFAZOLIN SODIUM-DEXTROSE 2-4 GM/100ML-% IV SOLN
2.0000 g | INTRAVENOUS | Status: AC
  Administered 2021-07-27 (×2): 2 g via INTRAVENOUS
  Filled 2021-07-27: qty 100

## 2021-07-27 MED ORDER — VENLAFAXINE HCL ER 75 MG PO CP24
150.0000 mg | ORAL_CAPSULE | Freq: Every day | ORAL | Status: DC
  Administered 2021-07-28 – 2021-07-29 (×2): 150 mg via ORAL
  Filled 2021-07-27 (×2): qty 2

## 2021-07-27 MED ORDER — ROCURONIUM BROMIDE 10 MG/ML (PF) SYRINGE
PREFILLED_SYRINGE | INTRAVENOUS | Status: AC
  Filled 2021-07-27: qty 10

## 2021-07-27 MED ORDER — HYDROMORPHONE HCL 1 MG/ML IJ SOLN
1.0000 mg | INTRAMUSCULAR | Status: AC | PRN
  Administered 2021-07-27: 1 mg via INTRAVENOUS
  Filled 2021-07-27: qty 1

## 2021-07-27 MED ORDER — PHENYLEPHRINE HCL-NACL 20-0.9 MG/250ML-% IV SOLN
INTRAVENOUS | Status: DC | PRN
  Administered 2021-07-27: 50 ug/min via INTRAVENOUS

## 2021-07-27 MED ORDER — DEXAMETHASONE SODIUM PHOSPHATE 10 MG/ML IJ SOLN
INTRAMUSCULAR | Status: DC | PRN
Start: 2021-07-27 — End: 2021-07-27
  Administered 2021-07-27: 10 mg via INTRAVENOUS

## 2021-07-27 MED ORDER — GLYCOPYRROLATE PF 0.2 MG/ML IJ SOSY
PREFILLED_SYRINGE | INTRAMUSCULAR | Status: AC
  Filled 2021-07-27: qty 1

## 2021-07-27 MED ORDER — GABAPENTIN 300 MG PO CAPS: 300.0000 mg | ORAL_CAPSULE | Freq: Once | ORAL | Status: DC

## 2021-07-27 MED ORDER — OXYCODONE HCL 5 MG PO TABS: 5.0000 mg | ORAL_TABLET | ORAL | Status: DC | PRN

## 2021-07-27 MED ORDER — CHLORHEXIDINE GLUCONATE 0.12 % MT SOLN
15.0000 mL | Freq: Once | OROMUCOSAL | Status: AC
  Administered 2021-07-27: 15 mL via OROMUCOSAL
  Filled 2021-07-27: qty 15

## 2021-07-27 MED ORDER — HYDROMORPHONE HCL 1 MG/ML IJ SOLN
INTRAMUSCULAR | Status: AC
  Filled 2021-07-27: qty 1

## 2021-07-27 MED ORDER — OXYCODONE-ACETAMINOPHEN 10-325 MG PO TABS: 1.0000 | ORAL_TABLET | Freq: Four times a day (QID) | ORAL | 0 refills | Status: AC | PRN

## 2021-07-27 MED ORDER — ACETAMINOPHEN 325 MG PO TABS
650.0000 mg | ORAL_TABLET | ORAL | Status: DC | PRN
  Administered 2021-07-27 – 2021-07-29 (×5): 650 mg via ORAL
  Filled 2021-07-27 (×5): qty 2

## 2021-07-27 MED ORDER — ONDANSETRON HCL 4 MG/2ML IJ SOLN
INTRAMUSCULAR | Status: AC
  Filled 2021-07-27: qty 2

## 2021-07-27 MED ORDER — BUPIVACAINE-EPINEPHRINE 0.25% -1:200000 IJ SOLN
INTRAMUSCULAR | Status: DC | PRN
Start: 2021-07-27 — End: 2021-07-27
  Administered 2021-07-27: 20 mL

## 2021-07-27 MED ORDER — ONDANSETRON HCL 4 MG/2ML IJ SOLN: 4.0000 mg | Freq: Four times a day (QID) | INTRAMUSCULAR | Status: DC | PRN

## 2021-07-27 MED ORDER — MIDAZOLAM HCL 2 MG/2ML IJ SOLN
INTRAMUSCULAR | Status: AC
  Filled 2021-07-27: qty 2

## 2021-07-27 MED ORDER — ENOXAPARIN SODIUM 40 MG/0.4ML IJ SOSY
40.0000 mg | PREFILLED_SYRINGE | INTRAMUSCULAR | Status: DC
  Administered 2021-07-28: 40 mg via SUBCUTANEOUS
  Filled 2021-07-27: qty 0.4

## 2021-07-27 MED ORDER — BUPIVACAINE-EPINEPHRINE (PF) 0.25% -1:200000 IJ SOLN
INTRAMUSCULAR | Status: AC
  Filled 2021-07-27: qty 30

## 2021-07-27 MED ORDER — SODIUM CHLORIDE (PF) 0.9 % IJ SOLN
INTRAMUSCULAR | Status: AC
  Filled 2021-07-27: qty 10

## 2021-07-27 MED ORDER — ONDANSETRON HCL 4 MG PO TABS: 4.0000 mg | ORAL_TABLET | Freq: Three times a day (TID) | ORAL | 0 refills | Status: DC | PRN

## 2021-07-27 MED ORDER — PROPOFOL 10 MG/ML IV BOLUS
INTRAVENOUS | Status: AC
  Filled 2021-07-27: qty 20

## 2021-07-27 MED ORDER — KETAMINE HCL 50 MG/5ML IJ SOSY
PREFILLED_SYRINGE | INTRAMUSCULAR | Status: AC
  Filled 2021-07-27: qty 5

## 2021-07-27 MED ORDER — SUFENTANIL CITRATE 50 MCG/ML IV SOLN
INTRAVENOUS | Status: DC | PRN
  Administered 2021-07-27 (×5): 10 ug via INTRAVENOUS

## 2021-07-27 MED ORDER — OXYCODONE HCL 5 MG PO TABS
10.0000 mg | ORAL_TABLET | ORAL | Status: DC | PRN
  Administered 2021-07-27 – 2021-07-29 (×13): 10 mg via ORAL
  Filled 2021-07-27 (×12): qty 2

## 2021-07-27 MED ORDER — THROMBIN 20000 UNITS EX KIT: PACK | CUTANEOUS | Status: DC | PRN

## 2021-07-27 MED ORDER — FLUTICASONE FUROATE-VILANTEROL 100-25 MCG/ACT IN AEPB
1.0000 | INHALATION_SPRAY | Freq: Every day | RESPIRATORY_TRACT | Status: DC
Start: 2021-07-28 — End: 2021-07-29
  Filled 2021-07-27: qty 28

## 2021-07-27 MED ORDER — SURGIFLO WITH THROMBIN (HEMOSTATIC MATRIX KIT) OPTIME
TOPICAL | Status: DC | PRN
  Administered 2021-07-27: 1 via TOPICAL

## 2021-07-27 MED ORDER — AMISULPRIDE (ANTIEMETIC) 5 MG/2ML IV SOLN: 10.0000 mg | Freq: Once | INTRAVENOUS | Status: DC | PRN

## 2021-07-27 MED ORDER — LACTATED RINGERS IV SOLN: INTRAVENOUS | Status: DC | PRN

## 2021-07-27 MED ORDER — DEXAMETHASONE SODIUM PHOSPHATE 10 MG/ML IJ SOLN
INTRAMUSCULAR | Status: AC
  Filled 2021-07-27: qty 1

## 2021-07-27 MED ORDER — HYDROMORPHONE HCL 1 MG/ML IJ SOLN
0.2500 mg | INTRAMUSCULAR | Status: DC | PRN
  Administered 2021-07-27 (×3): 0.5 mg via INTRAVENOUS

## 2021-07-27 MED ORDER — ROCURONIUM 10MG/ML (10ML) SYRINGE FOR MEDFUSION PUMP - OPTIME
INTRAVENOUS | Status: DC | PRN
  Administered 2021-07-27: 100 mg via INTRAVENOUS

## 2021-07-27 MED ORDER — ONDANSETRON HCL 4 MG/2ML IJ SOLN
INTRAMUSCULAR | Status: DC | PRN
  Administered 2021-07-27: 4 mg via INTRAVENOUS

## 2021-07-27 MED ORDER — PROPOFOL 500 MG/50ML IV EMUL
INTRAVENOUS | Status: DC | PRN
  Administered 2021-07-27: 200 ug/kg/min via INTRAVENOUS
  Administered 2021-07-27: 100 ug/kg/min via INTRAVENOUS

## 2021-07-27 MED ORDER — CEFAZOLIN SODIUM-DEXTROSE 1-4 GM/50ML-% IV SOLN
1.0000 g | Freq: Three times a day (TID) | INTRAVENOUS | Status: AC
  Administered 2021-07-27 – 2021-07-28 (×2): 1 g via INTRAVENOUS
  Filled 2021-07-27 (×2): qty 50

## 2021-07-27 MED ORDER — PHENYLEPHRINE HCL (PRESSORS) 10 MG/ML IV SOLN
INTRAVENOUS | Status: DC | PRN
  Administered 2021-07-27: 160 ug via INTRAVENOUS
  Administered 2021-07-27: 80 ug via INTRAVENOUS

## 2021-07-27 MED ORDER — SODIUM CHLORIDE 0.9% FLUSH
3.0000 mL | Freq: Two times a day (BID) | INTRAVENOUS | Status: DC
  Administered 2021-07-28 (×2): 3 mL via INTRAVENOUS

## 2021-07-27 MED ORDER — PHENYLEPHRINE 80 MCG/ML (10ML) SYRINGE FOR IV PUSH (FOR BLOOD PRESSURE SUPPORT)
PREFILLED_SYRINGE | INTRAVENOUS | Status: AC
  Filled 2021-07-27: qty 10

## 2021-07-27 MED ORDER — GLYCOPYRROLATE 0.2 MG/ML IJ SOLN
INTRAMUSCULAR | Status: DC | PRN
  Administered 2021-07-27: .2 mg via INTRAVENOUS

## 2021-07-27 SURGICAL SUPPLY — 105 items
AGENT HMST KT MTR STRL THRMB (HEMOSTASIS) ×2
ANCH SPNL 25 LMBR MIS (Anchor) ×6 IMPLANT
ANCHOR LUMBAR 25 MIS (Anchor) ×3 IMPLANT
APPLIER CLIP 11 MED OPEN (CLIP) ×3
APR CLP MED 11 20 MLT OPN (CLIP) ×2
BAG COUNTER SPONGE SURGICOUNT (BAG) ×7 IMPLANT
BAG SPNG CNTER NS LX DISP (BAG) ×2
BLADE CLIPPER SURG (BLADE) ×1 IMPLANT
BLADE SURG 10 STRL SS (BLADE) ×7 IMPLANT
CLIP APPLIE 11 MED OPEN (CLIP) ×6 IMPLANT
CLIP LIGATING EXTRA MED SLVR (CLIP) ×4 IMPLANT
CLIP NEUROVISION LG (CLIP) ×1 IMPLANT
CLSR STERI-STRIP ANTIMIC 1/2X4 (GAUZE/BANDAGES/DRESSINGS) ×2 IMPLANT
CORD BIPOLAR FORCEPS 12FT (ELECTRODE) ×4 IMPLANT
COVER PROBE W GEL 5X96 (DRAPES) ×1 IMPLANT
COVER SURGICAL LIGHT HANDLE (MISCELLANEOUS) ×8 IMPLANT
DRAIN CHANNEL 15F RND FF W/TCR (WOUND CARE) IMPLANT
DRAPE C-ARM 42X72 X-RAY (DRAPES) ×8 IMPLANT
DRAPE C-ARMOR (DRAPES) ×8 IMPLANT
DRAPE INCISE IOBAN 66X45 STRL (DRAPES) ×5 IMPLANT
DRAPE LAPAROTOMY T 102X78X121 (DRAPES) ×5 IMPLANT
DRAPE SURG 17X23 STRL (DRAPES) ×8 IMPLANT
DRAPE U-SHAPE 47X51 STRL (DRAPES) ×8 IMPLANT
DRSG AQUACEL AG ADV 3.5X 6 (GAUZE/BANDAGES/DRESSINGS) ×6 IMPLANT
DRSG OPSITE POSTOP 4X6 (GAUZE/BANDAGES/DRESSINGS) ×2 IMPLANT
DRSG OPSITE POSTOP 4X8 (GAUZE/BANDAGES/DRESSINGS) ×1 IMPLANT
DURAPREP 26ML APPLICATOR (WOUND CARE) ×8 IMPLANT
ELECT BLADE 4.0 EZ CLEAN MEGAD (MISCELLANEOUS) ×3
ELECT CAUTERY BLADE 6.4 (BLADE) ×4 IMPLANT
ELECT PENCIL ROCKER SW 15FT (MISCELLANEOUS) ×8 IMPLANT
ELECT REM PT RETURN 9FT ADLT (ELECTROSURGICAL) ×6
ELECTRODE BLDE 4.0 EZ CLN MEGD (MISCELLANEOUS) ×6 IMPLANT
ELECTRODE REM PT RTRN 9FT ADLT (ELECTROSURGICAL) ×6 IMPLANT
GLOVE BIO SURGEON STRL SZ 6.5 (GLOVE) ×8 IMPLANT
GLOVE BIO SURGEON STRL SZ7.5 (GLOVE) IMPLANT
GLOVE BIO SURGEON STRL SZ8 (GLOVE) ×4 IMPLANT
GLOVE BIOGEL PI IND STRL 6.5 (GLOVE) ×6 IMPLANT
GLOVE BIOGEL PI IND STRL 8 (GLOVE) IMPLANT
GLOVE BIOGEL PI IND STRL 8.5 (GLOVE) ×6 IMPLANT
GLOVE BIOGEL PI INDICATOR 6.5 (GLOVE) ×2
GLOVE BIOGEL PI INDICATOR 8 (GLOVE)
GLOVE BIOGEL PI INDICATOR 8.5 (GLOVE) ×2
GLOVE OPTIFIT SS 7.5 STRL LX (GLOVE) ×4 IMPLANT
GLOVE SS BIOGEL STRL SZ 7.5 (GLOVE) ×3 IMPLANT
GLOVE SS BIOGEL STRL SZ 8.5 (GLOVE) ×6 IMPLANT
GLOVE SUPERSENSE BIOGEL SZ 7.5 (GLOVE) ×1
GLOVE SUPERSENSE BIOGEL SZ 8.5 (GLOVE) ×2
GLOVE SURG SS PI 8.0 STRL IVOR (GLOVE) ×8 IMPLANT
GOWN STRL REUS W/ TWL LRG LVL3 (GOWN DISPOSABLE) ×6 IMPLANT
GOWN STRL REUS W/ TWL XL LVL3 (GOWN DISPOSABLE) ×3 IMPLANT
GOWN STRL REUS W/TWL 2XL LVL3 (GOWN DISPOSABLE) ×8 IMPLANT
GOWN STRL REUS W/TWL LRG LVL3 (GOWN DISPOSABLE) ×6
GOWN STRL REUS W/TWL XL LVL3 (GOWN DISPOSABLE) ×3
GUIDEWIRE NITINOL BEVEL TIP (WIRE) ×4 IMPLANT
HANDLE KNIFE GLBU DISP (ORTHOPEDIC DISPOSABLE SUPPLIES) ×1 IMPLANT
INSERT FOGARTY 61MM (MISCELLANEOUS) IMPLANT
INSERT FOGARTY SM (MISCELLANEOUS) IMPLANT
KIT BASIN OR (CUSTOM PROCEDURE TRAY) ×4 IMPLANT
KIT POSITION SURG JACKSON T1 (MISCELLANEOUS) ×4 IMPLANT
KIT TURNOVER KIT B (KITS) ×4 IMPLANT
MARKER PEN SURG W/LABELS BLK (STERILIZATION PRODUCTS) ×5 IMPLANT
MIX DBX 10CC 35% BONE (Bone Implant) ×1 IMPLANT
MODULE EMG NDL SSEP NVM5 (NEEDLE) IMPLANT
MODULE EMG NEEDLE SSEP NVM5 (NEEDLE) ×3 IMPLANT
MODULE NVM5 NEXT GEN EMG (NEEDLE) ×1 IMPLANT
NEEDLE HYPO 22GX1.5 SAFETY (NEEDLE) ×4 IMPLANT
NS IRRIG 1000ML POUR BTL (IV SOLUTION) ×6 IMPLANT
PACK LAMINECTOMY ORTHO (CUSTOM PROCEDURE TRAY) ×4 IMPLANT
PACK UNIVERSAL I (CUSTOM PROCEDURE TRAY) ×8 IMPLANT
PAD ARMBOARD 7.5X6 YLW CONV (MISCELLANEOUS) ×16 IMPLANT
PATTIES SURGICAL .5 X1 (DISPOSABLE) IMPLANT
PENCIL SMOKE EVACUATOR (MISCELLANEOUS) ×3 IMPLANT
PROBE BALL TIP NVM5 SNG USE (BALLOONS) ×1 IMPLANT
ROD RELINE MAS TI LORD 5.5X40 (Rod) ×2 IMPLANT
SCREW LOCK RELINE 5.5 TULIP (Screw) ×4 IMPLANT
SCREW RELINE MAS POLY 6.5X40MM (Screw) ×2 IMPLANT
SCREW RELINE RED 6.5X45MM POLY (Screw) ×2 IMPLANT
SPACER HEDRON IA 26X34X11 8D (Spacer) ×1 IMPLANT
SPONGE INTESTINAL PEANUT (DISPOSABLE) ×14 IMPLANT
SPONGE SURGIFOAM ABS GEL 100 (HEMOSTASIS) ×4 IMPLANT
SPONGE T-LAP 18X18 ~~LOC~~+RFID (SPONGE) ×5 IMPLANT
SPONGE T-LAP 4X18 ~~LOC~~+RFID (SPONGE) ×9 IMPLANT
STRIP CLOSURE SKIN 1/2X4 (GAUZE/BANDAGES/DRESSINGS) ×6 IMPLANT
SURGIFLO W/THROMBIN 8M KIT (HEMOSTASIS) ×1 IMPLANT
SUT MNCRL AB 3-0 PS2 27 (SUTURE) ×7 IMPLANT
SUT PDS AB 1 CTX 36 (SUTURE) ×11 IMPLANT
SUT PROLENE 4 0 RB 1 (SUTURE)
SUT PROLENE 4-0 RB1 .5 CRCL 36 (SUTURE) IMPLANT
SUT PROLENE 5 0 CC1 (SUTURE) IMPLANT
SUT PROLENE 6 0 BV (SUTURE) ×6 IMPLANT
SUT PROLENE 6 0 C 1 30 (SUTURE) IMPLANT
SUT SILK 0 TIES 10X30 (SUTURE) ×3 IMPLANT
SUT SILK 2 0 TIES 10X30 (SUTURE) ×7 IMPLANT
SUT SILK 2 0SH CR/8 30 (SUTURE) IMPLANT
SUT SILK 3 0 TIES 10X30 (SUTURE) ×7 IMPLANT
SUT SILK 3 0SH CR/8 30 (SUTURE) IMPLANT
SUT VIC AB 2-0 CT1 18 (SUTURE) ×8 IMPLANT
SYR BULB IRRIG 60ML STRL (SYRINGE) ×4 IMPLANT
SYR CONTROL 10ML LL (SYRINGE) ×4 IMPLANT
TOWEL GREEN STERILE (TOWEL DISPOSABLE) ×12 IMPLANT
TOWEL GREEN STERILE FF (TOWEL DISPOSABLE) ×4 IMPLANT
TRAY FOLEY W/BAG SLVR 16FR (SET/KITS/TRAYS/PACK) ×3
TRAY FOLEY W/BAG SLVR 16FR ST (SET/KITS/TRAYS/PACK) ×3 IMPLANT
WATER STERILE IRR 1000ML POUR (IV SOLUTION) ×3 IMPLANT
YANKAUER SUCT BULB TIP NO VENT (SUCTIONS) ×4 IMPLANT

## 2021-07-27 NOTE — Anesthesia Procedure Notes (Addendum)
Arterial Line Insertion Start/End5/18/2023 8:00 AM, 07/27/2021 8:15 AM Performed by: Marcene Duos, MD  Preanesthetic checklist: patient identified, IV checked, monitors and equipment checked and pre-op evaluation Patient sedated Left, radial was placed Catheter size: 20 G Hand hygiene performed  and maximum sterile barriers used   Attempts: 4 Procedure performed using ultrasound guided technique. Ultrasound Notes:anatomy identified, needle tip was noted to be adjacent to the nerve/plexus identified, no ultrasound evidence of intravascular and/or intraneural injection and image(s) printed for medical record Following insertion, dressing applied and Biopatch. Post procedure complications: second provider assisted. Patient tolerated the procedure well with no immediate complications.

## 2021-07-27 NOTE — OR Nursing (Signed)
No foreign objects seen on x-ray post anterior lumbar surgery per Dr. Darl Pikes, Radiologist.

## 2021-07-27 NOTE — Transfer of Care (Signed)
Immediate Anesthesia Transfer of Care Note  Patient: Tara [name]  Procedure(s) Performed: ANTERIOR LUMBAR INTERBODY FUSION AND POSTERIOR SPINAL FUSION WITH PEDICLE SCREWS LUMBAR FIVE TO SACRAL ONE (Spine Lumbar) ABDOMINAL EXPOSURE FOR ANTERIOR SPINE SURGERY (Abdomen)  Patient Location: PACU  Anesthesia Type:General  Level of Consciousness: drowsy, patient cooperative and responds to stimulation  Airway & Oxygen Therapy: Patient Spontanous Breathing and Patient connected to nasal cannula oxygen  Post-op Assessment: Report given to RN, Post -op Vital signs reviewed and stable and Patient moving all extremities X 4  Post vital signs: Reviewed and stable  Last Vitals:  Vitals Value Taken Time  BP 125/23 07/27/21 1305  Temp    Pulse 99 07/27/21 1307  Resp 12 07/27/21 1307  SpO2 98 % 07/27/21 1307  Vitals shown include unvalidated device data.  Last Pain:  Vitals:   07/27/21 0614  PainSc: 5          Complications: No notable events documented.

## 2021-07-27 NOTE — Brief Op Note (Signed)
07/27/2021  12:58 PM  PATIENT:  Tara [name]  56 y.o. female  PRE-OPERATIVE DIAGNOSIS:  Post laminectomy syndrome with degenerative disc disease L5-S1  POST-OPERATIVE DIAGNOSIS:  Post laminectomy syndrome with degenerative disc disease L5-S1  PROCEDURE:  Procedure(s) with comments: ANTERIOR LUMBAR INTERBODY FUSION AND POSTERIOR SPINAL FUSION WITH PEDICLE SCREWS LUMBAR FIVE TO SACRAL ONE (N/A) - 4.5 hrs Dr. Lenell Antu to do approach left tap block with exparel 3 C-Bed ABDOMINAL EXPOSURE FOR ANTERIOR SPINE SURGERY (N/A)  SURGEON:  Surgeon(s) and Role: Panel 1:    Venita Lick, MD - Primary Panel 2:    * Leonie Douglas, MD - Primary  PHYSICIAN ASSISTANT:   ASSISTANTS: none   ANESTHESIA:   none  EBL:  110 mL   BLOOD ADMINISTERED:none  DRAINS: none   LOCAL MEDICATIONS USED:  MARCAINE     SPECIMEN:  No Specimen  DISPOSITION OF SPECIMEN:  N/A  COUNTS:  YES  TOURNIQUET:  * No tourniquets in log *  DICTATION: .Dragon Dictation  PLAN OF CARE: Admit to inpatient   PATIENT DISPOSITION:  PACU - hemodynamically stable.

## 2021-07-27 NOTE — Anesthesia Procedure Notes (Signed)
Anesthesia Regional Block: TAP block   Pre-Anesthetic Checklist: , timeout performed,  Correct Patient, Correct Site, Correct Laterality,  Correct Procedure, Correct Position, site marked,  Risks and benefits discussed,  Surgical consent,  Pre-op evaluation,  At surgeon's request and post-op pain management  Laterality: Left  Prep: chloraprep       Needles:  Injection technique: Single-shot  Needle Type: Echogenic Needle     Needle Length: 9cm  Needle Gauge: 21     Additional Needles:   Procedures:,,,, ultrasound used (permanent image in chart),,    Narrative:  Start time: 07/27/2021 7:15 AM End time: 07/27/2021 7:22 AM Injection made incrementally with aspirations every 5 mL.  Performed by: Personally  Anesthesiologist: Marcene Duos, MD

## 2021-07-27 NOTE — Interval H&P Note (Signed)
History and Physical Interval Note:  07/27/2021 7:21 AM  Tara [name]  has presented today for surgery, with the diagnosis of Post laminectomy syndrome with degenerative disc disease L5-S1.  The various methods of treatment have been discussed with the patient and family. After consideration of risks, benefits and other options for treatment, the patient has consented to  Procedure(s) with comments: ANTERIOR AND POSTERIOR SPINAL FUSION (ALIF L5-S1 WITH POSTERIOR SPINAL FUSION) (N/A) - 4.5 hrs Dr. Lenell Antu to do approach left tap block with exparel 3 C-Bed ABDOMINAL EXPOSURE (N/A) as a surgical intervention.  The patient's history has been reviewed, patient examined, no change in status, stable for surgery.  I have reviewed the patient's chart and labs.  Questions were answered to the patient's satisfaction.     Leonie Douglas

## 2021-07-27 NOTE — Anesthesia Preprocedure Evaluation (Signed)
Anesthesia Evaluation  Patient identified by MRN, date of birth, ID band Patient awake    Reviewed: Allergy & Precautions, NPO status , Patient's Chart, lab work & pertinent test results  Airway Mallampati: II  TM Distance: >3 FB Neck ROM: Full    Dental  (+) Dental Advisory Given   Pulmonary COPD, former smoker,    breath sounds clear to auscultation       Cardiovascular negative cardio ROS   Rhythm:Regular Rate:Normal     Neuro/Psych  Headaches,  Neuromuscular disease    GI/Hepatic Neg liver ROS, GERD  ,  Endo/Other  negative endocrine ROS  Renal/GU negative Renal ROS     Musculoskeletal  (+) Arthritis ,   Abdominal   Peds  Hematology negative hematology ROS (+)   Anesthesia Other Findings   Reproductive/Obstetrics                             Lab Results  Component Value Date   WBC 10.4 07/19/2021   HGB 12.9 07/19/2021   HCT 37.9 07/19/2021   MCV 92.4 07/19/2021   PLT 358 07/19/2021   Lab Results  Component Value Date   CREATININE 1.08 (H) 07/19/2021   BUN 13 07/19/2021   NA 134 (L) 07/19/2021   K 4.7 07/19/2021   CL 101 07/19/2021   CO2 26 07/19/2021    Anesthesia Physical Anesthesia Plan  ASA: 2  Anesthesia Plan: General   Post-op Pain Management: Regional block*, Gabapentin PO (pre-op)*, Tylenol PO (pre-op)*, Ketamine IV* and Dilaudid IV   Induction:   PONV Risk Score and Plan: 3 and Midazolam, Dexamethasone, Ondansetron and Treatment may vary due to age or medical condition  Airway Management Planned: Oral ETT  Additional Equipment: Arterial line  Intra-op Plan:   Post-operative Plan: Extubation in OR  Informed Consent: I have reviewed the patients History and Physical, chart, labs and discussed the procedure including the risks, benefits and alternatives for the proposed anesthesia with the patient or authorized representative who has indicated his/her  understanding and acceptance.     Dental advisory given  Plan Discussed with: CRNA  Anesthesia Plan Comments:         Anesthesia Quick Evaluation

## 2021-07-27 NOTE — Progress Notes (Signed)
Removed 16g EJ (R) without issues/ no swelling noted post removal/ gauze drsing  applied

## 2021-07-27 NOTE — Progress Notes (Signed)
Jakala has done well in PACU . Pain is relatively well controlled. Has been restless, when not sleeping, turns side to side independently and with great ease. Will state "it hurts" when turning.

## 2021-07-27 NOTE — Discharge Instructions (Signed)
    Spinal Fusion, Adult, Care After This sheet gives you information about how to care for yourself after your procedure. Your doctor may also give you more specific instructions. If you have problems or questions, contact your doctor. Follow these instructions at home: Medicines Take over-the-counter and prescription medicines only as told by your doctor. These include any medicines for pain or blood-thinning medicines (anticoagulants). If you were prescribed an antibiotic medicine, take it as told by your doctor. Do not stop taking the antibiotic even if you start to feel better. Do not drive for 24 hours if you were given a medicine to help you relax (sedative) during your procedure. Do not drive or use heavy machinery while taking prescription pain medicine. If you have a brace: Wear the brace as told by your doctor. Take it off only as told by your doctor. Keep the brace clean. Managing pain, stiffness, and swelling If directed, put ice on the surgery area: If you have a removable brace, take it off as told by your doctor. Put ice in a plastic bag. Place a towel between your skin and the bag. Leave the ice on for 20 minutes, 2-3 times a day. Surgery cut care    Follow instructions from your doctor about how to take care of your cut from surgery (incision). Make sure you: Wash your hands with soap and water before you change your bandage (dressing). If you cannot use soap and water, use hand sanitizer. Change your bandage as told by your doctor. Leave stitches (sutures), skin glue, or skin tape (adhesive) strips in place. They may need to stay in place for 2 weeks or longer. If tape strips get loose and curl up, you may trim the loose edges. Do not remove tape strips completely unless your doctor says it is okay. Keep your cut from surgery clean and dry. Do not take baths, swim, or use a hot tub until your doctor says it is okay. Ask your doctor if you can take showers. You may  only be allowed to take sponge baths. Every day, check your cut from surgery and the area around it for: More redness, swelling, or pain. Fluid or blood. Warmth. Pus or a bad smell. If you have a drain tube, follow instructions from your doctor about caring for it. Do not take out the drain tube or any bandages unless your doctor says it is okay. Physical activity Rest and protect your back as much as possible. Follow instructions from your doctor about how to move. Use good posture to help your spine heal. Do not lift anything that is heavier than 8 lb (3.6 kg), or the limit that you are told, until your doctor says that it is safe. Do not twist or bend at the waist until your doctor says it is okay. It is best if you: Do not make pushing and pulling motions. Do not sit or lie down in the same position for a long time. Do not raise your hands or arms above your head. Return to your normal activities as told by your doctor. Ask your doctor what activities are safe for you. Rest and protect your back as much as you can. Do not start to exercise until your doctor says it is okay. Ask your doctor what kinds of exercise you can do to make your back stronger. Ok to shower in 5 days.  Do not take a bath or submerge the wound General instructions To prevent blood clots and   lessen swelling in your legs: Wear compression stockings as told. Walk one or more times every few hours as told by your doctor. Do not use any products that contain nicotine or tobacco, such as cigarettes and e-cigarettes. These can delay bone healing. If you need help quitting, ask your doctor. To prevent or treat constipation while you are taking prescription pain medicine, your doctor may suggest that you: Drink enough fluid to keep your pee (urine) pale yellow. Take over-the-counter or prescription medicines. Eat foods that are high in fiber. These include fresh fruits and vegetables, whole grains, and beans. Limit foods  that are high in fat and processed sugars, such as fried and sweet foods. Keep all follow-up visits as told by your doctor. This is important. Contact a doctor if: Your pain gets worse. Your medicine does not help your pain. Your legs or feet get painful or swollen. Your cut from surgery is more red, swollen, or painful. Your cut from surgery feels warm to the touch. You have: Fluid or blood coming from your cut from surgery. Pus or a bad smell coming from your cut from surgery. A fever. Weakness or loss of feeling (numbness) in your legs that is new or getting worse. Trouble controlling when you pee (urinate) or poop (have a bowel movement). You feel sick to your stomach (nauseous). You throw up (vomit). Get help right away if: Your pain is very bad. You have chest pain. You have trouble breathing. You start to have a cough. These symptoms may be an emergency. Do not wait to see if the symptoms will go away. Get medical help right away. Call your local emergency services (911 in the [place] Do not drive yourself to the hospital. Summary After the procedure, it is common to have pain in your back and pain by your surgery cut(s). Icing and pain medicines may help to control the pain. Follow directions from your doctor. Rest and protect your back as much as possible. Do not twist or bend at the waist. Get up and walk one or more times every few hours as told by your doctor. This information is not intended to replace advice given to you by your health care provider. Make sure you discuss any questions you have with your health care provider.   Enoxaparin injection What is this medicine? ENOXAPARIN (ee nox a PA rin) is used after knee, hip, or abdominal surgeries to prevent blood clotting. It is also used to treat existing blood clots in the lungs or in the veins. This medicine may be used for other purposes; ask your health care provider or pharmacist if you have questions. COMMON BRAND  NAME(S): Lovenox What should I tell my health care provider before I take this medicine? They need to know if you have any of these conditions: -bleeding disorders, hemorrhage, or hemophilia -infection of the heart or heart valves -kidney or liver disease -previous stroke -prosthetic heart valve -recent surgery or delivery of a baby -ulcer in the stomach or intestine, diverticulitis, or other bowel disease -an unusual or allergic reaction to enoxaparin, heparin, pork or pork products, other medicines, foods, dyes, or preservatives -pregnant or trying to get pregnant -breast-feeding How should I use this medicine? This medicine is for injection under the skin. It is usually given by a health-care professional. You or a family member may be trained on how to give the injections. If you are to give yourself injections, make sure you understand how to use the syringe, measure the   dose if necessary, and give the injection. To avoid bruising, do not rub the site where this medicine has been injected. Do not take your medicine more often than directed. Do not stop taking except on the advice of your doctor or health care professional. Make sure you receive a puncture-resistant container to dispose of the needles and syringes once you have finished with them. Do not reuse these items. Return the container to your doctor or health care professional for proper disposal. Talk to your pediatrician regarding the use of this medicine in children. Special care may be needed. Overdosage: If you think you have taken too much of this medicine contact a poison control center or emergency room at once. NOTE: This medicine is only for you. Do not share this medicine with others. What if I miss a dose? If you miss a dose, take it as soon as you can. If it is almost time for your next dose, take only that dose. Do not take double or extra doses. What may interact with this medicine? -aspirin and aspirin-like  medicines -certain medicines that treat or prevent blood clots -dipyridamole -NSAIDs, medicines for pain and inflammation, like ibuprofen or naproxen This list may not describe all possible interactions. Give your health care provider a list of all the medicines, herbs, non-prescription drugs, or dietary supplements you use. Also tell them if you smoke, drink alcohol, or use illegal drugs. Some items may interact with your medicine. What should I watch for while using this medicine? Visit your healthcare professional for regular checks on your progress. You may need blood work done while you are taking this medicine. Your condition will be monitored carefully while you are receiving this medicine. It is important not to miss any appointments. If you are going to need surgery or other procedure, tell your healthcare professional that you are using this medicine. Using this medicine for a long time may weaken your bones and increase the risk of bone fractures. Avoid sports and activities that might cause injury while you are using this medicine. Severe falls or injuries can cause unseen bleeding. Be careful when using sharp tools or knives. Consider using an electric razor. Take special care brushing or flossing your teeth. Report any injuries, bruising, or red spots on the skin to your healthcare professional. Wear a medical ID bracelet or chain. Carry a card that describes your disease and details of your medicine and dosage times. What side effects may I notice from receiving this medicine? Side effects that you should report to your doctor or health care professional as soon as possible: -allergic reactions like skin rash, itching or hives, swelling of the face, lips, or tongue -bone pain -signs and symptoms of bleeding such as bloody or black, tarry stools; red or dark-brown urine; spitting up blood or brown material that looks like coffee grounds; red spots on the skin; unusual bruising or  bleeding from the eye, gums, or nose -signs and symptoms of a blood clot such as chest pain; shortness of breath; pain, swelling, or warmth in the leg -signs and symptoms of a stroke such as changes in vision; confusion; trouble speaking or understanding; severe headaches; sudden numbness or weakness of the face, arm or leg; trouble walking; dizziness; loss of coordination Side effects that usually do not require medical attention (report to your doctor or health care professional if they continue or are bothersome): -hair loss -pain, redness, or irritation at site where injected This list may not describe all possible   side effects. Call your doctor for medical advice about side effects. You may report side effects to FDA at 1-800-FDA-1088. Where should I keep my medicine? Keep out of the reach of children. Store at room temperature between 15 and 30 degrees C (59 and 86 degrees F). Do not freeze. If your injections have been specially prepared, you may need to store them in the refrigerator. Ask your pharmacist. Throw away any unused medicine after the expiration date. NOTE: This sheet is a summary. It may not cover all possible information. If you have questions about this medicine, talk to your doctor, pharmacist, or health care provider.  2019 [employer] (2017-02-21 11:25:34)  

## 2021-07-27 NOTE — H&P (Signed)
History: Tara [name] today with ongoing severe low back pain. Pain level is 8/10. Pain significantly interferes with activities of daily living as well as overall quality of life.   At this point time despite physical therapy, and physician directed home exercise therapy as well as a recent epidural injection the patient's quality of life continues to deteriorate. She has had a previous L5-S1 decompression and her primary complaint is severe low back pain.  Plan on L5/S1 ALIF with [facility].  Past Medical History:  Diagnosis Date   Anxiety    Arthritis    arthritis in back   Barrett's esophagus    COPD (chronic obstructive pulmonary disease) ([place])    uses spiriva   CPRS 1 (complex regional pain syndrome I) of upper limb 2007   formerly called rsd   Depression    being treated for depression   GERD (gastroesophageal reflux disease)    H/O degenerative disc disease    chronic back pain   Headache(784.0)    headaches due to pain   Neuromuscular disorder ([place])    rsd   Pneumonia    2011 - hospitalized for 4 days   Pneumonia    Reflex sympathetic dystrophy    Tobacco abuse     Allergies  Allergen Reactions   Aspirin Other (See Comments)    Causes heartburn even the chewable kind   Clonidine Derivatives Itching    No current facility-administered medications on file prior to encounter.   Current Outpatient Medications on File Prior to Encounter  Medication Sig Dispense Refill   Calcium 600-200 MG-UNIT tablet Take 1 tablet by mouth 2 (two) times daily.     diclofenac Sodium (VOLTAREN) 1 % GEL Apply 1 application. topically 3 (three) times daily as needed (back pain.).     famotidine (PEPCID) 20 MG tablet Take 1 tablet (20 mg total) by mouth 2 (two) times daily. (Patient taking differently: Take 20 mg by mouth in the morning, at noon, and at bedtime.) 180 tablet 1   Fluticasone-Umeclidin-Vilant (TRELEGY ELLIPTA) 100-62.5-25 MCG/INH AEPB Inhale 1 puff into the lungs daily.  (Patient taking differently: Inhale 1 puff into the lungs daily as needed (respiratory issues.).) 180 each 0   oxyCODONE (OXYCONTIN) 20 mg 12 hr tablet Take 20 mg by mouth every 12 (twelve) hours as needed (pain.).     pregabalin (LYRICA) 75 MG capsule Take 1 capsule (75 mg total) by mouth 3 (three) times daily. (Patient taking differently: Take 75 mg by mouth 3 (three) times daily as needed (pain.).) 270 capsule 0    Physical Exam: Vitals:   07/27/21 0552  BP: (!) 127/57  Pulse: 67  Resp: 17  Temp: 98.1 F (36.7 C)  SpO2: 97%   Body mass index is 24.37 kg/m.  Tara [name] is a pleasant individual, who appears younger than their stated age.  She is alert and orientated 3.  No shortness of breath, chest pain. Lungs: Clear to auscultation bilaterally Cardiac: Regular rate and rhythm . no rubs gallops or murmurs.  Abdomen is soft and non-tender, negative loss of bowel and bladder control, no rebound tenderness.  Negative: skin lesions abrasions contusions  Peripheral pulses: 2+ dorsalis pedis/posterior tibialis pulses bilaterally. LE compartments are: Soft and nontender.  Gait pattern: She is able to heel toe ambulate with out any loss of balance. Normal gait pattern.  Assistive devices: None  Neuro: 5/5 motor strength in the lower extremity bilaterally. Negative Babinski test, positive clonus -5 beats. Intermittent dysesthesias into  the lower extremity bilaterally. Symmetrical 2+ deep tendon reflexes at the knee and achilles  Musculoskeletal: Significant low back pain with palpation and range of motion. Pain is most intense with forward flexion and rotation. No SI joint pain.  Image: X-ray findings: Demonstrate slight anterior listhesis at L4-5 unchanged x-rays. No significant collapse of the L4-5 disc. There does appear to be progression of the degenerative disease at L5-S1. No abnormal motion on flexion-extension views.  MRI/CT scan findings: CT lumbar myelogram: There is mild  disc space narrowing at L4-5 and anterior listhesis which was not present in 2013. Advanced degenerative disc disease at L5-S1 with prior laminotomy. No residual spinal or lateral recess stenosis  A/P: Tara [name] today with ongoing severe low back pain. Pain level is 8/10. Pain significantly interferes with activities of daily living as well as overall quality of life.  Diagnosis: At this point time despite physical therapy, and physician directed home exercise therapy as well as a recent epidural injection the patient's quality of life continues to deteriorate. She has had a previous L5-S1 decompression and her primary complaint is severe low back pain.  Imaging studies demonstrate low-grade spondylolisthesis at L4-5 but this was also present on her 2021 x-ray. On flexion-extension views there is no abnormal motion of the slip at L4-5. While the CT scan does demonstrate facet arthropathy there is no significant pathological motion at this level. Based on this I believe her primary pain generator is the collapsed degenerated L5-S1 level. This is also the level where she had a previous decompression.  Summary of Treatment Plan:  1. Diagnosis: Degenerative lumbar disc disease L5-S1. Postlaminectomy syndrome L5-S1 2. Tobacco use: None 3. Conservative treatment: Physical therapy: Physician directed therapy program (home exercise program) Injection therapy: Lumbar ESI on 05/30/2021 4. Surgical plan: ALIF L5-S1 with supplemental posterior pedicle screw fixation  Risks and benefits of ALIF were discussed with the patient. These include: Infection, bleeding, death, stroke, paralysis, ongoing or worse pain, need for additional surgery, nonunion, leak of spinal fluid, adjacent segment degeneration requiring additional fusion surgery, need for posterior decompression and/or fusion. Bleeding from major vessels, and blood clots (deep venous thrombosis)requiring additional treatment, loss in bowel and bladder  control, injury to bladder, ureters, and other major abdominal organs.  Risks and benefits of spinal fusion: Infection, bleeding, death, stroke, paralysis, ongoing or worse pain, need for additional surgery, nonunion, leak of spinal fluid, adjacent segment degeneration requiring additional fusion surgery, Injury to abdominal vessels that can require anterior surgery to stop bleeding. Malposition of the cage and/or pedicle screws that could require additional surgery. Loss of bowel and bladder control. Postoperative hematoma causing neurologic compression that could require urgent or emergent re-operation.

## 2021-07-27 NOTE — Op Note (Signed)
OPERATIVE REPORT  DATE OF SURGERY: 07/27/2021  PATIENT NAME:  Tara [name] MRN: [id number] DOB: 1965-04-29  PCP: Tara Cory, MD  PRE-OPERATIVE DIAGNOSIS: Postlaminectomy syndrome back buttock and recurrent radicular leg pain.  Degenerative lumbar disc disease L5-S1  POST-OPERATIVE DIAGNOSIS: Same  PROCEDURE:   A left L5-S1.  Posterior pedicle screw fixation and fusion L5-S1  SURGEON:  Tara Lick, MD  PHYSICIAN ASSISTANT: None  Approach surgeon: Dr. Clementeen [name]  ANESTHESIA:   General  EBL: 110 ml   Complications: None  Implants: [place] IA intervertebral cage.  11 mm height 8 degree lordotic medium.  25 mm anchors x3. NuVasive MIS pedicle screws.  6.5 x 45 mm length screws at L5, and 6.5 x 40 mm length screws at S1.  40 mm length rod.  Graft: DBX mix  Neuromonitoring: All 4 pedicle screws were directly stimulated at the conclusion of the case and there was no adverse activity greater than 40 mA.  There was temporary abnormal runs in the left biceps for Morris but these resolved and they were felt to be due to mild irritation but no direct injury to the nerve root.  The end of the case there was no abnormal free running EMG activity and SSEPs remained normal.  BRIEF HISTORY: Tara [name] is a 56 y.o. female who presents to my care with ongoing severe back buttock and neuropathic leg pain.  The patient has had a prior lumbar L5-S1 discectomy and did well until recently when she started having recurrent pain.  Imaging studies demonstrated advanced degenerative disc disease with foraminal stenosis at L5-S1.  As a result of the failure of conservative management we elected to move forward with surgery.  All appropriate risks benefits and alternatives were discussed with patient and consent was obtained  PROCEDURE DETAILS: Patient was brought into the operating room and was properly positioned on the operating room table.  After induction with general anesthesia the  patient was endotracheally intubated.  A timeout was taken to confirm all important data: including patient, procedure, and the level. Teds, SCD's were applied.   Foley was inserted and the patient was placed supine on the operating room table.  The abdomen was prepped and draped in a standard fashion.  Using fluoroscopy we marked out our anterior abdominal incision site.  At this point Tara [name] performed a standard anterior retroperitoneal approach to the lumbar spine.  Please refer to his dictation for specifics.  I was able to assist him.  Once we had the retractor in place and the L5-S1 disc space was exposed a needle was placed into the L5-S1 disc space and an intraoperative x-ray was taken to confirm that we are at the appropriate level.  At this point Tara [name] scrubbed out and I continued on with the case.  An annulotomy was performed with a 10 blade scalpel and using pituitary rongeurs to remove the bulk of the disc material.  I then placed a distracting plug into the disc space in order to continue using my curettes working posteriorly towards the annulus.  Once I had an adequate amount of disc material out I placed the expandable distraction trial into the disc space.  I was gently distracting the disc space over approximately 5 to 10-minute period.  Ultimately I was able to take the distractor from the starting height of 7 mm up to a final height of approximately 11 mm.  We had excellent restoration of intervertebral disc space and indirect foraminal decompression.  I  then remove the expandable trial device and I was able to use my curette to work posteriorly to remove the remainder of the disc material.  I then used my curved curette to release the annulus from the posterior aspect of the L5 and S1 vertebral body.  Under live fluoroscopy using the lamina spreader I confirmed that had parallel endplate distraction and improvement in the interval foraminal volume.  At this point I then rasped the  endplates and used my trialing devices.  I elected to place the eleven 8 degree lordotic medium spacer.  This provided the best overall restoration of intervertebral height.  At this point I made sure had bleeding subchondral bone and I again used the 2 mm Kerrison rongeur to continue to resect the posterior annulus until I completely removed it and had adequate indirect decompression.  The implant was obtained and packed with the allograft and then malleted to the appropriate depth.  Once I confirmed satisfactory position I then secured into the intervertebral space with the staples.  I then gated the wound copiously normal saline and then locked the staples according to manufacture standards.  The retractors were sequentially removed and I confirmed hemostasis.  At this point the fascia of the rectus was reapproximated with a running #1 PDS suture then a 0 Vicryl suture was used to close Scarpa's fascia and finally a layer of interrupted 2-0 Vicryl sutures and a 3-0 Monocryl for the skin.  Steri-Strips and dry dressings were applied.  At this point time the drapes were removed and the [facility] frame was secured over the patient.  Once she was secured into position she was rotated into the prone position.  The patient was now repositioned in the prone position.  The back was prepped and draped in a standard fashion.  Using fluoroscopy I marked out the lateral border of the L5 and S1 pedicles bilaterally.  I marked out my incision site and infiltrated with quarter percent Marcaine with epinephrine.  A Wiltsie incision was made and I bluntly dissected down to the posterior lateral aspect of the spine.  The Jamshidi needle was then placed on the lateral aspect of the pedicle and using fluoroscopy and neuro monitoring I advanced the Jamshidi needle into the L5 pedicle.  As I was nearing the lateral border I confirmed that I was just beyond the posterior wall of the vertebral body in the lateral plane.  Once  I confirm trajectory advanced into the vertebral body.  The guidepin was then inserted to cannulate the pedicle and the Jamshidi needle was removed.  Using this exact same technique I cannulated the S1 and the contralateral L5 and S1 pedicles.  Once all 4 pedicles were cannulated I then measured and then placed the pedicle screws over the guidepin and into the pedicle.  Once all 4 pedicle screws were in place I remove the guidepins and then stimulated all 4 pedicles.  There was no adverse activity at greater than 40 mA.  I then measured and placed the 40 mm rod and the locking caps.  All 4 locking caps were secured and torqued according to manufacture standards.  The insertion tabs were removed and final x-rays were taken demonstrating satisfactory overall position in both the AP and lateral planes.  Both wounds were copiously irrigated with normal saline and the deep fascia was closed with interrupted #1 Vicryl suture, then a layer of 2-0 Vicryl suture, and finally 3-0 Monocryl for the skin.  Steri-Strips and dry dressings were  applied and the patient was ultimately extubated and transferred the PACU without incident.  The end of the case all needle sponge counts were correct.  There were no adverse intraoperative events.  Tara Lick, MD 07/27/2021 12:45 PM

## 2021-07-27 NOTE — Anesthesia Procedure Notes (Signed)
Procedure Name: Intubation Date/Time: 07/27/2021 7:41 AM Performed by: Claris Che, CRNA Pre-anesthesia Checklist: Patient identified, Emergency Drugs available, Suction available, Patient being monitored and Timeout performed Patient Re-evaluated:Patient Re-evaluated prior to induction Oxygen Delivery Method: Circle system utilized Preoxygenation: Pre-oxygenation with 100% oxygen Induction Type: IV induction and Cricoid Pressure applied Ventilation: Mask ventilation without difficulty Laryngoscope Size: Mac and 4 Grade View: Grade II Tube type: Oral Tube size: 7.5 mm Number of attempts: 1 Airway Equipment and Method: Stylet Placement Confirmation: ETT inserted through vocal cords under direct vision, positive ETCO2 and breath sounds checked- equal and bilateral Secured at: 23 cm Tube secured with: Tape Dental Injury: Teeth and Oropharynx as per pre-operative assessment

## 2021-07-27 NOTE — Op Note (Signed)
DATE OF SERVICE: 07/27/2021  PATIENT:  Tara [name]  56 y.o. female  PRE-OPERATIVE DIAGNOSIS:  back pain with radiculopathy  POST-OPERATIVE DIAGNOSIS:  Same  PROCEDURE:   anterior spinal exposure of L5/S1 disc space  CO-SURGEONS:      Venita Lick, MD - Primary    * Lenell Antu Rande Brunt, MD - Primary  ANESTHESIA:   general  EBL: minimal  BLOOD ADMINISTERED:none  DRAINS: none   LOCAL MEDICATIONS USED:  NONE  SPECIMEN:  none  COUNTS: confirmed correct.  TOURNIQUET:  none  PATIENT DISPOSITION:  PACU - hemodynamically stable.   Delay start of Pharmacological VTE agent (>24hrs) due to surgical blood loss or risk of bleeding: no  INDICATION FOR PROCEDURE: CHARLANE WESTRY is a 56 y.o. female with back pain with radiculopathy. Dr. Shon Baton recommended L5/S1 ALIF. I reviewed her films and felt this was safe to approach anteriorly. careful discussion of risks, benefits, and alternatives the patient was offered anterior spinal exposure. The patient  understood and wished to proceed.  OPERATIVE FINDINGS: unremarkable retroperitoneal exposure of L5/S1 disc space  DESCRIPTION OF PROCEDURE: After identification of the patient in the pre-operative holding area, the patient was transferred to the operating room. The patient was positioned supine on the operating room table. Anesthesia was induced. The abdomen was prepped and draped in standard fashion. A surgical pause was performed confirming correct patient, procedure, and operative location.  Using intraoperative fluoroscopy, the skin over the abdomen overlying the L5/S1 disc base was marked on the skin.  The abdomen was prepped and draped in usual sterile fashion.  A transverse incision was made over the premarked skin in the left lower quadrant.  Incision was carried down through subtenons tissue and Scarpa's fascia until the anterior rectus fascia was identified.  Anterior rectus fascia was incised longitudinally.  The rectus muscle  was completely skeletonized free of its loose areolar attachments.  I entered the retroperitoneal space bluntly in the lateral aspect of the subrectus space.  I carried a blunt exposure laterally and posteriorly until I encountered the psoas muscle.  The peritoneal contents were swept anteriorly.  I encountered the prevertebral fascia medial to the left iliac vessels.  The middle sacral vessels were swept laterally to the left.  We introduced a self-retaining retractor system and developed adequate exposure for Dr. Shon Baton.  The candidate disc base was marked with a 18-gauge needle.  Fluoroscopy confirmed we had identified the correct level.  At this point I turned the case over to Dr. Shon Baton to proceed with anterior lumbar interbody fusion.  I remained available to help him should any bleeding issues arise.  Rande Brunt. Lenell Antu, MD [facility] Phone Number: [phone] 07/27/2021 10:33 AM

## 2021-07-28 ENCOUNTER — Encounter (HOSPITAL_COMMUNITY): Payer: Medicare PPO

## 2021-07-28 ENCOUNTER — Inpatient Hospital Stay (HOSPITAL_COMMUNITY): Payer: Medicare PPO

## 2021-07-28 DIAGNOSIS — M7989 Other specified soft tissue disorders: Secondary | ICD-10-CM | POA: Diagnosis not present

## 2021-07-28 DIAGNOSIS — Z9889 Other specified postprocedural states: Secondary | ICD-10-CM

## 2021-07-28 MED FILL — Heparin Sodium (Porcine) Inj 1000 Unit/ML: INTRAMUSCULAR | Qty: 30 | Status: AC

## 2021-07-28 MED FILL — Sodium Chloride IV Soln 0.9%: INTRAVENOUS | Qty: 1000 | Status: AC

## 2021-07-28 NOTE — Progress Notes (Signed)
[facility] [place] PROGRESS NOTE  ASSESSMENT / PLAN: Tara [name] is a 56 y.o. female s/p ALIF 5/18.  Having severe back pain today. Able to ambulate. Observing overnight. Will check again tomorrow.  SUBJECTIVE: Severe back pain.  OBJECTIVE: BP 125/74 (BP Location: Left Arm)   Pulse 96   Temp 98.5 F (36.9 C)   Resp 16   Ht 5\' 9"  (1.753 m)   Wt 74.8 kg   SpO2 96%   BMI 24.37 kg/m   Constitutional: well appearing. no acute distress. Cardiac: RRR. Pulmonary: unlabored Abdomen: soft     Latest Ref Rng & Units 07/27/2021    3:56 PM 07/19/2021    2:41 PM 06/22/2021    3:40 PM  CBC  WBC 4.0 - 10.5 K/uL 9.7   10.4   8.2    Hemoglobin 12.0 - 15.0 g/dL 06/24/2021   46.2   70.3    Hematocrit 36.0 - 46.0 % 30.6   37.9   39.8    Platelets 150 - 400 K/uL 276   358   337          Latest Ref Rng & Units 07/27/2021    3:56 PM 07/19/2021    2:41 PM 06/22/2021    3:40 PM  CMP  Glucose 70 - 99 mg/dL  98   93    BUN 6 - 20 mg/dL  13   13    Creatinine 0.44 - 1.00 mg/dL 06/24/2021   9.38   1.82    Sodium 135 - 145 mmol/L  134   132    Potassium 3.5 - 5.1 mmol/L  4.7   5.4    Chloride 98 - 111 mmol/L  101   96    CO2 22 - 32 mmol/L  26   29    Calcium 8.9 - 10.3 mg/dL  9.3   9.7    Total Protein 6.1 - 8.1 g/dL   7.5    Total Bilirubin 0.2 - 1.2 mg/dL   0.3    AST 10 - 35 U/L   18    ALT 6 - 29 U/L   21      Estimated Creatinine Clearance: 81.1 mL/min (by C-G formula based on SCr of 0.79 mg/dL).  9.93. Rande Brunt, MD [facility] Phone Number: [phone] 07/28/2021 5:45 PM

## 2021-07-28 NOTE — Evaluation (Signed)
Physical Therapy Evaluation & Discharge Patient Details Name: Tara [name] MRN: [id number] DOB: [birth year] Today's Date: 07/28/2021  History of Present Illness  56 y/o female admitted 07/27/21 following L5-S1 ALIF with posterior fusion. PMH: COPD, CRPS, barrett's esophagus  Clinical Impression  Patient admitted following procedure. Patient functioning overall at supervision level with no AD. Able to negotiate stairs to safely access home environment. Educated patient on back precautions, brace wear, and activity progression, patient demonstrated understanding. Partner will be present to assist her at discharge. No further skilled PT needs identified acutely. No PT follow up recommended at this time.        Recommendations for follow up therapy are one component of a multi-disciplinary discharge planning process, led by the attending physician.  Recommendations may be updated based on patient status, additional functional criteria and insurance authorization.  Follow Up Recommendations No PT follow up    Assistance Recommended at Discharge Intermittent Supervision/Assistance  Patient can return home with the following  A little help with bathing/dressing/bathroom;Assistance with cooking/housework;Assist for transportation    Equipment Recommendations None recommended by PT  Recommendations for Other Services       Functional Status Assessment Patient has had a recent decline in their functional status and demonstrates the ability to make significant improvements in function in a reasonable and predictable amount of time.     Precautions / Restrictions Precautions Precautions: Back Precaution Booklet Issued: Yes (comment) Required Braces or Orthoses: Spinal Brace Spinal Brace: Lumbar corset Restrictions Weight Bearing Restrictions: No      Mobility  Bed Mobility Overal bed mobility: Needs Assistance Bed Mobility: Rolling, Sidelying to Sit Rolling: Supervision Sidelying to  sit: Supervision       General bed mobility comments: supervision for safety. Cues for log roll technique. Left sitting on EOB at end of session    Transfers Overall transfer level: Modified independent Equipment used: None                    Ambulation/Gait Ambulation/Gait assistance: Modified independent (Device/Increase time) Gait Distance (Feet): 300 Feet Assistive device: None Gait Pattern/deviations: Step-through pattern, Decreased stride length Gait velocity: decreased        Stairs Stairs: Yes Stairs assistance: Supervision Stair Management: One rail Left, Alternating pattern, Forwards Number of Stairs: 3 General stair comments: supervision for safety  Wheelchair Mobility    Modified Rankin (Stroke Patients Only)       Balance Overall balance assessment: Mild deficits observed, not formally tested                                           Pertinent Vitals/Pain Pain Assessment Pain Assessment: Faces Faces Pain Scale: Hurts even more Pain Location: back Pain Descriptors / Indicators: Grimacing, Sharp, Stabbing Pain Intervention(s): Monitored during session    Home Living Family/patient expects to be discharged to:: Private residence Living Arrangements: Spouse/significant other Available Help at Discharge: Family Type of Home: House Home Access: Stairs to enter Entrance Stairs-Rails: Doctor, general practice of Steps: 3   Home Layout: One level Home Equipment: Firefighter;Shower seat      Prior Function Prior Level of Function : Independent/Modified Independent                     Hand Dominance        Extremity/Trunk Assessment   Upper Extremity Assessment Upper Extremity Assessment:  Defer to OT evaluation    Lower Extremity Assessment Lower Extremity Assessment: Generalized weakness    Cervical / Trunk Assessment Cervical / Trunk Assessment: Back Surgery  Communication    Communication: No difficulties  Cognition Arousal/Alertness: Awake/alert Behavior During Therapy: WFL for tasks assessed/performed Overall Cognitive Status: Within Functional Limits for tasks assessed                                          General Comments      Exercises     Assessment/Plan    PT Assessment Patient does not need any further PT services  PT Problem List         PT Treatment Interventions      PT Goals (Current goals can be found in the Care Plan section)  Acute Rehab PT Goals Patient Stated Goal: to go home PT Goal Formulation: All assessment and education complete, DC therapy    Frequency       Co-evaluation               AM-PAC PT "6 Clicks" Mobility  Outcome Measure Help needed turning from your back to your side while in a flat bed without using bedrails?: A Little Help needed moving from lying on your back to sitting on the side of a flat bed without using bedrails?: A Little Help needed moving to and from a bed to a chair (including a wheelchair)?: A Little Help needed standing up from a chair using your arms (e.g., wheelchair or bedside chair)?: A Little Help needed to walk in hospital room?: None Help needed climbing 3-5 steps with a railing? : A Little 6 Click Score: 19    End of Session Equipment Utilized During Treatment: Back brace Activity Tolerance: Patient tolerated treatment well Patient left: Other (comment) (sitting EOB at end of session) Nurse Communication: Mobility status PT Visit Diagnosis: Muscle weakness (generalized) (M62.81)    Time: 0981-1914 PT Time Calculation (min) (ACUTE ONLY): 23 min   Charges:   PT Evaluation $PT Eval Moderate Complexity: 1 Mod PT Treatments $Therapeutic Activity: 8-22 mins        Gizelle Whetsel A. Dan Humphreys PT, DPT Acute Rehabilitation Services Pager [phone] Office [phone]   Viviann Spare 07/28/2021, 9:39 AM

## 2021-07-28 NOTE — Progress Notes (Signed)
    Subjective: Procedure(s) (LRB): ANTERIOR LUMBAR INTERBODY FUSION AND POSTERIOR SPINAL FUSION WITH PEDICLE SCREWS LUMBAR FIVE TO SACRAL ONE (N/A) ABDOMINAL EXPOSURE FOR ANTERIOR SPINE SURGERY (N/A) 1 Day Post-Op  Patient reports pain as 3 on 0-10 scale.  Reports decreased leg pain reports incisional back pain   Positive void Negative bowel movement Positive flatus Negative chest pain or shortness of breath  Objective: Vital signs in last 24 hours: Temp:  [97.3 F (36.3 C)-99.5 F (37.5 C)] 98 F (36.7 C) (05/19 0741) Pulse Rate:  [69-98] 85 (05/19 0741) Resp:  [10-20] 16 (05/19 0741) BP: (103-138)/(42-71) 103/42 (05/19 0741) SpO2:  [83 %-98 %] 97 % (05/19 0741)  Intake/Output from previous day: 05/18 0701 - 05/19 0700 In: 3370 [I.V.:3000; IV Piggyback:370] Out: 210 [Urine:100; Blood:110]  Labs: Recent Labs    07/27/21 1556  WBC 9.7  RBC 3.30*  HCT 30.6*  PLT 276   Recent Labs    07/27/21 1556  CREATININE 0.79   No results for input(s): LABPT, INR in the last 72 hours.  Physical Exam: Neurologically intact ABD soft Intact pulses distally Incision: dressing C/D/I and no drainage Compartment soft Body mass index is 24.37 kg/m.   Assessment/Plan: Patient stable  xrays n/a Continue mobilization with physical therapy Continue care  Overall patient doing exceptionally well.  We will check lower extremity Doppler today.  Continue mobilization with therapy and plan for discharge to home tomorrow.  Melina Schools, MD [facility] [phone]

## 2021-07-28 NOTE — Anesthesia Postprocedure Evaluation (Signed)
Anesthesia Post Note  Patient: Tara [name]  Procedure(s) Performed: ANTERIOR LUMBAR INTERBODY FUSION AND POSTERIOR SPINAL FUSION WITH PEDICLE SCREWS LUMBAR FIVE TO SACRAL ONE (Spine Lumbar) ABDOMINAL EXPOSURE FOR ANTERIOR SPINE SURGERY (Abdomen)     Patient location during evaluation: PACU Anesthesia Type: General Level of consciousness: awake and alert Pain management: pain level controlled Vital Signs Assessment: post-procedure vital signs reviewed and stable Respiratory status: spontaneous breathing, nonlabored ventilation, respiratory function stable and patient connected to nasal cannula oxygen Cardiovascular status: blood pressure returned to baseline and stable Postop Assessment: no apparent nausea or vomiting Anesthetic complications: no   No notable events documented.  Last Vitals:  Vitals:   07/28/21 0741 07/28/21 1215  BP: (!) 103/42 (!) 114/45  Pulse: 85 79  Resp: 16 16  Temp: 36.7 C 36.7 C  SpO2: 97% 98%    Last Pain:  Vitals:   07/28/21 1431  TempSrc:   PainSc: [name]

## 2021-07-28 NOTE — Progress Notes (Signed)
Lower extremity venous bilateral study completed. ° °Preliminary results relayed to RN via secure chat. ° °See CV Proc for preliminary results report.  ° °Drake Wuertz, RDMS, RVT ° °

## 2021-07-28 NOTE — Evaluation (Signed)
Occupational Therapy Evaluation and Discharge Patient Details Name: Tara [name] MRN: [id number] DOB: 07-29-1965 Today's Date: 07/28/2021   History of Present Illness 56 y/o female admitted 07/27/21 following L5-S1 ALIF with posterior fusion. PMH: COPD, CRPS, barrett's esophagus   Clinical Impression   Pt with severe stabbing pain upon arrival, assisted pt to sidelying and RN provided pain meds with pain level decreasing to 3-4/10. Pt and husband educated at length in back precautions related to ADLs and IADLs to avoid with pt verbalizing understanding.  Pt returning home with supportive husband upon discharge. No further OT needs.      Recommendations for follow up therapy are one component of a multi-disciplinary discharge planning process, led by the attending physician.  Recommendations may be updated based on patient status, additional functional criteria and insurance authorization.   Follow Up Recommendations  No OT follow up    Assistance Recommended at Discharge Intermittent Supervision/Assistance  Patient can return home with the following Assistance with cooking/housework;Help with stairs or ramp for entrance;Assist for transportation;A little help with bathing/dressing/bathroom    Functional Status Assessment  Patient has had a recent decline in their functional status and demonstrates the ability to make significant improvements in function in a reasonable and predictable amount of time.  Equipment Recommendations  None recommended by OT    Recommendations for Other Services       Precautions / Restrictions Precautions Precautions: Back Precaution Booklet Issued: Yes (comment) Required Braces or Orthoses: Spinal Brace Spinal Brace: Lumbar corset;Applied in sitting position Restrictions Weight Bearing Restrictions: No      Mobility Bed Mobility Overal bed mobility: Needs Assistance Bed Mobility: Sit to Sidelying         Sit to sidelying: Min  assist General bed mobility comments: assisted LEs back into bed    Transfers Overall transfer level: Modified independent Equipment used: None                      Balance Overall balance assessment: Mild deficits observed, not formally tested                                         ADL either performed or assessed with clinical judgement   ADL Overall ADL's : Needs assistance/impaired Eating/Feeding: Independent   Grooming: Supervision/safety Grooming Details (indicate cue type and reason): educated in 2 cup method for toothbrushing, use of washcloth on face Upper Body Bathing: Set up;Sitting Upper Body Bathing Details (indicate cue type and reason): recommended long handled bath sponge for back Lower Body Bathing: Minimal assistance;Sit to/from stand Lower Body Bathing Details (indicate cue type and reason): recommended long handled bath sponge and use of reacher Upper Body Dressing : Set up;Sitting   Lower Body Dressing: Minimal assistance;Sit to/from stand Lower Body Dressing Details (indicate cue type and reason): educated in use of reacher, safe footwear Toilet Transfer: Supervision/safety     Toileting - Clothing Manipulation Details (indicate cue type and reason): instructed in use of tongs and wet wipes for pericare     Functional mobility during ADLs: Supervision/safety General ADL Comments: Instructed in IADLs to avoid. Pt has a supportive husband to assist as needed.     Vision Ability to See in Adequate Light: 0 Adequate       Perception     Praxis      Pertinent Vitals/Pain Pain Assessment Pain Assessment: Faces Faces  Pain Scale: Hurts little more Pain Location: back Pain Descriptors / Indicators: Grimacing, Sharp, Stabbing, Crying Pain Intervention(s): Patient requesting pain meds-RN notified, RN gave pain meds during session     Hand Dominance Right   Extremity/Trunk Assessment Upper Extremity Assessment Upper  Extremity Assessment: Overall WFL for tasks assessed;LUE deficits/detail LUE Deficits / Details: h/o RSD   Lower Extremity Assessment Lower Extremity Assessment: Defer to PT evaluation   Cervical / Trunk Assessment Cervical / Trunk Assessment: Back Surgery   Communication Communication Communication: No difficulties   Cognition Arousal/Alertness: Awake/alert Behavior During Therapy: WFL for tasks assessed/performed, Anxious Overall Cognitive Status: Within Functional Limits for tasks assessed                                 General Comments: anxious with high pain level, decreased to 3-4 after medication     General Comments       Exercises     Shoulder Instructions      Home Living Family/patient expects to be discharged to:: Private residence Living Arrangements: Spouse/significant other Available Help at Discharge: Family;Available 24 hours/day Type of Home: House Home Access: Stairs to enter [employer] of Steps: 3 Entrance Stairs-Rails: Right;Left Home Layout: One level     Bathroom Shower/Tub: Chief Strategy Officer: Handicapped height     Home Equipment: Firefighter;Shower seat;Adaptive equipment Adaptive Equipment: Reacher        Prior Functioning/Environment Prior Level of Function : Independent/Modified Independent               ADLs Comments: has been assisted for IADLs since back pain has increased        OT Problem List:        OT Treatment/Interventions:      OT Goals(Current goals can be found in the care plan section)    OT Frequency:      Co-evaluation              AM-PAC OT "6 Clicks" Daily Activity     Outcome Measure Help from another person eating meals?: None Help from another person taking care of personal grooming?: None Help from another person toileting, which includes using toliet, bedpan, or urinal?: None Help from another person bathing (including washing, rinsing,  drying)?: A Little Help from another person to put on and taking off regular upper body clothing?: None Help from another person to put on and taking off regular lower body clothing?: A Little 6 Click Score: 22   End of Session    Activity Tolerance: Patient tolerated treatment well Patient left: in bed;with call bell/phone within reach;with family/visitor present  OT Visit Diagnosis: Pain                Time: 1749-4496 OT Time Calculation (min): 35 min Charges:  OT General Charges $OT Visit: 1 Visit OT Evaluation $OT Eval Low Complexity: 1 Low OT Treatments $Self Care/Home Management : 8-22 mins Martie Round, OTR/L Acute Rehabilitation Services Pager: [phone] Office: [phone]   Evern Bio 07/28/2021, 9:52 AM

## 2021-07-29 NOTE — Progress Notes (Signed)
Patient awaiting transport to her vehicle for discharge home; in no acute distress nor complaints of pain nor discomfort; moves all extremities well; incision on her abdomen and back with honeycomb dressing and were clean, dry and intact; room was checked for all her belongings and were taken along with her to her vehicle; discharge instructions concerning medications, incision care, follow up appointment and when to call the doctor as needed were all discussed with patient and her wife and both verbalized understanding on the instructions given.

## 2021-07-29 NOTE — Progress Notes (Signed)
[facility] [place] PROGRESS NOTE  ASSESSMENT / PLAN: Tara [name] is a 56 y.o. female s/p ALIF 5/18.  Back pain less severe.  No issues from vascular standpoint OK for discharge from my standpoint  SUBJECTIVE: Back pain improved overnight.  OBJECTIVE: BP (!) 128/57 (BP Location: Right Arm)   Pulse 71   Temp 97.8 F (36.6 C) (Oral)   Resp 18   Ht 5\' 9"  (1.753 m)   Wt 74.8 kg   SpO2 97%   BMI 24.37 kg/m   Constitutional: well appearing. no acute distress. Cardiac: RRR. Pulmonary: unlabored Abdomen: soft. Clean dressing to abdomen.     Latest Ref Rng & Units 07/27/2021    3:56 PM 07/19/2021    2:41 PM 06/22/2021    3:40 PM  CBC  WBC 4.0 - 10.5 K/uL 9.7   10.4   8.2    Hemoglobin 12.0 - 15.0 g/dL 10.4   12.9   13.4    Hematocrit 36.0 - 46.0 % 30.6   37.9   39.8    Platelets 150 - 400 K/uL 276   358   337          Latest Ref Rng & Units 07/27/2021    3:56 PM 07/19/2021    2:41 PM 06/22/2021    3:40 PM  CMP  Glucose 70 - 99 mg/dL  98   93    BUN 6 - 20 mg/dL  13   13    Creatinine 0.44 - 1.00 mg/dL 0.79   1.08   0.85    Sodium 135 - 145 mmol/L  134   132    Potassium 3.5 - 5.1 mmol/L  4.7   5.4    Chloride 98 - 111 mmol/L  101   96    CO2 22 - 32 mmol/L  26   29    Calcium 8.9 - 10.3 mg/dL  9.3   9.7    Total Protein 6.1 - 8.1 g/dL   7.5    Total Bilirubin 0.2 - 1.2 mg/dL   0.3    AST 10 - 35 U/L   18    ALT 6 - 29 U/L   21      Estimated Creatinine Clearance: 81.1 mL/min (by C-G formula based on SCr of 0.79 mg/dL).  Yevonne Aline. Stanford Breed, MD [facility] Phone Number: [phone] 07/29/2021 6:14 AM

## 2021-07-29 NOTE — Progress Notes (Signed)
Subjective: 2 Days Post-Op Procedure(s) (LRB): ANTERIOR LUMBAR INTERBODY FUSION AND POSTERIOR SPINAL FUSION WITH PEDICLE SCREWS LUMBAR FIVE TO SACRAL ONE (N/A) ABDOMINAL EXPOSURE FOR ANTERIOR SPINE SURGERY (N/A)  Patient reports pain as mild. She feels this is much improved from yesterday. She now reports only incisional pain but that is well controlled. Denies numbness/tingling. Eager to go home.  Objective:   VITALS:  Temp:  [97.8 F (36.6 C)-98.5 F (36.9 C)] 98 F (36.7 C) (05/20 0733) Pulse Rate:  [71-96] 75 (05/20 0733) Resp:  [16-18] 16 (05/20 0733) BP: (101-129)/(45-74) 101/47 (05/20 0733) SpO2:  [96 %-99 %] 99 % (05/20 0733)  Gen: AAOx3, NAD Comfortable at rest  Bilateral Lower Extremity: Incision c/d/i No erythema/induration/drainage HF/KE/KF/ADF/APF/EHL 5/5 SILT throughout DP, PT 2+ to palp CR < 2s    LABS Recent Labs    07/27/21 1556  HGB 10.4*  WBC 9.7  PLT 276   Recent Labs    07/27/21 1556  CREATININE 0.79   No results for input(s): LABPT, INR in the last 72 hours.   Assessment/Plan: 2 Days Post-Op Procedure(s) (LRB): ANTERIOR LUMBAR INTERBODY FUSION AND POSTERIOR SPINAL FUSION WITH PEDICLE SCREWS LUMBAR FIVE TO SACRAL ONE (N/A) ABDOMINAL EXPOSURE FOR ANTERIOR SPINE SURGERY (N/A)  Patient stable  xrays n/a Continue mobilization with physical therapy Continue care  Continue mobilization with therapy and plan for discharge to home today per Dr. Eunice Blase 07/29/2021, 9:40 AM

## 2021-07-31 ENCOUNTER — Encounter (HOSPITAL_COMMUNITY): Payer: Self-pay | Admitting: Orthopedic Surgery

## 2021-08-05 NOTE — Discharge Summary (Signed)
Physician Discharge Summary  Patient ID: Tara [name] MRN: [id number] DOB/AGE: 1965-04-16 56 y.o.  Admit date: 07/27/2021 Discharge date: 07/29/2021  Admission Diagnoses: Postlaminectomy syndrome back buttock and recurrent radicular leg pain.  Degenerative lumbar disc disease L5-S1  Discharge Diagnoses:  Same  Discharged Condition: good  Hospital Course:  Tara [name] is a 56 y.o. female who presents to my care with ongoing severe back buttock and neuropathic leg pain.  The patient has had a prior lumbar L5-S1 discectomy and did well until recently when she started having recurrent pain.  Imaging studies demonstrated advanced degenerative disc disease with foraminal stenosis at L5-S1.  As a result of the failure of conservative management we elected to move forward with surgery.  All appropriate risks benefits and alternatives were discussed with patient and consent was obtained.  On 07/27/21, the patient underwent ALIF L5-S1.  Posterior pedicle screw fixation and fusion L5-S1. She recovered well postoperatively and was discharge home on POD2.  Consults: None  Significant Diagnostic Studies: None  Treatments: surgery as detailed above  Discharge Exam: Blood pressure (!) 101/47, pulse 75, temperature 98 F (36.7 C), temperature source Oral, resp. rate 16, height 5\' 9"  (1.753 m), weight 74.8 kg, SpO2 99 %. Gen: AAOx3, NAD Comfortable at rest  Bilateral Lower Extremity: Incision c/d/i  Motor intact at baseline SILT throughout DP, PT 2+ to palp CR < 2s   Disposition: Discharge disposition: 01-Home or Self Care       Discharge Instructions     Discharge patient   Complete by: As directed    Discharge disposition: 01-Home or Self Care   Discharge patient date: 07/29/2021   Incentive spirometry RT   Complete by: As directed       Allergies as of 07/29/2021       Reactions   Aspirin Other (See Comments)   Causes heartburn even the chewable kind   Clonidine  Derivatives Itching        Medication List     STOP taking these medications    diclofenac Sodium 1 % Gel Commonly known as: VOLTAREN   mupirocin ointment 2 % Commonly known as: BACTROBAN   oxyCODONE 20 mg 12 hr tablet Commonly known as: OXYCONTIN       TAKE these medications    Calcium 600-200 MG-UNIT tablet Take 1 tablet by mouth 2 (two) times daily.   enoxaparin 40 MG/0.4ML injection Commonly known as: LOVENOX Inject 0.4 mLs (40 mg total) into the skin daily for 10 days. 10 day supply 1 injection per day   esomeprazole 40 MG capsule Commonly known as: NEXIUM Take 1 capsule (40 mg total) by mouth 2 (two) times daily.   Eszopiclone 3 MG Tabs Commonly known as: eszopiclone Take 1 tablet (3 mg total) by mouth at bedtime. Take immediately before bedtime   famotidine 20 MG tablet Commonly known as: Pepcid Take 1 tablet (20 mg total) by mouth 2 (two) times daily. What changed: when to take this   ondansetron 4 MG tablet Commonly known as: Zofran Take 1 tablet (4 mg total) by mouth every 8 (eight) hours as needed for nausea or vomiting.   pregabalin 75 MG capsule Commonly known as: LYRICA Take 1 capsule (75 mg total) by mouth 3 (three) times daily. What changed:  when to take this reasons to take this   rosuvastatin 10 MG tablet Commonly known as: Crestor Take 1 tablet (10 mg total) by mouth daily.   Trelegy Ellipta 100-62.5-25 MCG/ACT Aepb Generic drug:  Fluticasone-Umeclidin-Vilant Inhale 1 puff into the lungs daily. What changed:  when to take this reasons to take this   venlafaxine XR 150 MG 24 hr capsule Commonly known as: EFFEXOR-XR TAKE 1 CAPSULE(150 MG) BY MOUTH DAILY WITH BREAKFAST       ASK your doctor about these medications    methocarbamol 500 MG tablet Commonly known as: ROBAXIN Take 1 tablet (500 mg total) by mouth every 8 (eight) hours as needed for up to 5 days for muscle spasms. Ask about: Should I take this medication?    oxyCODONE-acetaminophen 10-325 MG tablet Commonly known as: Percocet Take 1 tablet by mouth every 6 (six) hours as needed for up to 5 days for pain. Ask about: Should I take this medication?        Follow-up Information     Venita Lick, MD. Schedule an appointment as soon as possible for a visit in 2 week(s).   Specialty: Orthopedic Surgery Why: If symptoms worsen, For suture removal, For wound re-check Contact information: [address] [place] [address] [phone]                 Signed: Netta Cedars 08/05/2021, 7:03 PM

## 2021-09-07 DIAGNOSIS — M4326 Fusion of spine, lumbar region: Secondary | ICD-10-CM | POA: Diagnosis not present

## 2021-09-07 DIAGNOSIS — Z4889 Encounter for other specified surgical aftercare: Secondary | ICD-10-CM | POA: Diagnosis not present

## 2021-09-11 DIAGNOSIS — Z4889 Encounter for other specified surgical aftercare: Secondary | ICD-10-CM | POA: Diagnosis not present

## 2021-09-13 DIAGNOSIS — Z4889 Encounter for other specified surgical aftercare: Secondary | ICD-10-CM | POA: Diagnosis not present

## 2021-09-13 DIAGNOSIS — M4326 Fusion of spine, lumbar region: Secondary | ICD-10-CM | POA: Diagnosis not present

## 2021-09-16 ENCOUNTER — Other Ambulatory Visit: Payer: Self-pay | Admitting: Family Medicine

## 2021-09-16 DIAGNOSIS — K219 Gastro-esophageal reflux disease without esophagitis: Secondary | ICD-10-CM

## 2021-09-18 ENCOUNTER — Other Ambulatory Visit: Payer: Self-pay

## 2021-09-18 DIAGNOSIS — K219 Gastro-esophageal reflux disease without esophagitis: Secondary | ICD-10-CM

## 2021-09-18 MED ORDER — ESOMEPRAZOLE MAGNESIUM 40 MG PO CPDR: 40.0000 mg | DELAYED_RELEASE_CAPSULE | Freq: Two times a day (BID) | ORAL | 1 refills | Status: DC

## 2021-09-26 DIAGNOSIS — M4326 Fusion of spine, lumbar region: Secondary | ICD-10-CM | POA: Diagnosis not present

## 2021-09-26 DIAGNOSIS — Z4889 Encounter for other specified surgical aftercare: Secondary | ICD-10-CM | POA: Diagnosis not present

## 2021-09-27 ENCOUNTER — Encounter: Payer: Self-pay | Admitting: Family Medicine

## 2021-09-27 ENCOUNTER — Other Ambulatory Visit: Payer: Self-pay | Admitting: Family Medicine

## 2021-09-29 DIAGNOSIS — Z4889 Encounter for other specified surgical aftercare: Secondary | ICD-10-CM | POA: Diagnosis not present

## 2021-09-29 DIAGNOSIS — M4326 Fusion of spine, lumbar region: Secondary | ICD-10-CM | POA: Diagnosis not present

## 2021-10-02 DIAGNOSIS — Z4889 Encounter for other specified surgical aftercare: Secondary | ICD-10-CM | POA: Diagnosis not present

## 2021-10-02 DIAGNOSIS — M4326 Fusion of spine, lumbar region: Secondary | ICD-10-CM | POA: Diagnosis not present

## 2021-10-05 ENCOUNTER — Other Ambulatory Visit: Payer: Self-pay | Admitting: Family Medicine

## 2021-10-05 DIAGNOSIS — G90511 Complex regional pain syndrome I of right upper limb: Secondary | ICD-10-CM

## 2021-10-06 ENCOUNTER — Other Ambulatory Visit: Payer: Self-pay

## 2021-10-06 ENCOUNTER — Encounter: Payer: Self-pay | Admitting: Family Medicine

## 2021-10-06 DIAGNOSIS — G90511 Complex regional pain syndrome I of right upper limb: Secondary | ICD-10-CM

## 2021-10-09 ENCOUNTER — Other Ambulatory Visit: Payer: Self-pay | Admitting: Family Medicine

## 2021-10-09 DIAGNOSIS — G90511 Complex regional pain syndrome I of right upper limb: Secondary | ICD-10-CM

## 2021-10-09 MED ORDER — PREGABALIN 75 MG PO CAPS: 75.0000 mg | ORAL_CAPSULE | Freq: Three times a day (TID) | ORAL | 0 refills | Status: DC

## 2021-10-10 DIAGNOSIS — M4326 Fusion of spine, lumbar region: Secondary | ICD-10-CM | POA: Diagnosis not present

## 2021-10-10 DIAGNOSIS — Z4889 Encounter for other specified surgical aftercare: Secondary | ICD-10-CM | POA: Diagnosis not present

## 2021-10-11 NOTE — Progress Notes (Deleted)
Name: Tara [name]   MRN: [id number]    DOB: 06-17-1965   Date:10/11/2021       Progress Note  Subjective  Chief Complaint  Nausea   I connected with  CATHLINE DOWEN  on 10/11/21 at  9:40 AM EDT by a video enabled telemedicine application and verified that I am speaking with the correct person using two identifiers.  I discussed the limitations of evaluation and management by telemedicine and the availability of in person appointments. The patient expressed understanding and agreed to proceed with the virtual visit  Staff also discussed with the patient that there may be a patient responsible charge related to this service. Patient Location: *** Provider Location: *** Additional Individuals present: ***  HPI  *** Patient Active Problem List   Diagnosis Date Noted   S/P lumbar fusion 07/27/2021   Thoracic aorta atherosclerosis (HCC) 05/26/2021   Right arm pain 11/05/2019   Complex regional pain syndrome type 1 of right upper extremity 10/16/2019   Gastropathy 07/24/2017   Purpura, nonthrombocytopenic (HCC) 05/17/2017   Fatty liver 01/28/2015   Barrett esophagus 09/10/2014   Major depression, recurrent, chronic (HCC) 09/08/2014   Insomnia, persistent 09/08/2014   Arthralgia, sacroiliac 09/08/2014   Degeneration of lumbar or lumbosacral intervertebral disc 09/08/2014   Gastro-esophageal reflux disease without esophagitis 09/08/2014   Perennial allergic rhinitis 09/08/2014   Nerve root disorder 09/08/2014   Reflex sympathetic dystrophy 09/08/2014   Retained orthopedic hardware 01/15/2012   Chronic pain 01/25/2011    Past Surgical History:  Procedure Laterality Date   ABDOMINAL EXPOSURE N/A 07/27/2021   Procedure: ABDOMINAL EXPOSURE FOR ANTERIOR SPINE SURGERY;  Surgeon: Leonie Douglas, MD;  [facility] OR;  Service: Vascular;  Laterality: N/A;   ABDOMINAL HYSTERECTOMY  2005   partial   ANTERIOR AND POSTERIOR SPINAL FUSION N/A 07/27/2021   Procedure: ANTERIOR LUMBAR  INTERBODY FUSION AND POSTERIOR SPINAL FUSION WITH PEDICLE SCREWS LUMBAR FIVE TO SACRAL ONE;  Surgeon: Venita Lick, MD;  Location: [facility] OR;  Service: Orthopedics;  Laterality: N/A;  4.5 hrs Dr. Lenell Antu to do approach left tap block with exparel 3 C-Bed   CERVICAL DISC ARTHROPLASTY  2008   ESOPHAGOGASTRODUODENOSCOPY (EGD) WITH PROPOFOL N/A 06/04/2017   Procedure: ESOPHAGOGASTRODUODENOSCOPY (EGD) WITH PROPOFOL;  Surgeon: Christena Deem, MD;  Location: [facility] ENDOSCOPY;  Service: Endoscopy;  Laterality: N/A;   KNEE ARTHROSCOPY Right 2008   LUMBAR LAMINECTOMY/DECOMPRESSION MICRODISCECTOMY  01/25/2011   Procedure: LUMBAR LAMINECTOMY/DECOMPRESSION MICRODISCECTOMY;  Surgeon: Alvy Beal;  Location: [facility] OR;  Service: Orthopedics;  Laterality: Left;  LEFT L5-S1 MICRODISCECTOMY, Central Decompression Lumbar five-sacral one   OOPHORECTOMY Left 2011   OOPHORECTOMY Right 10/2012   SPINAL CORD STIMULATOR IMPLANT  2009   Removal-01/2012   SPINAL CORD STIMULATOR REMOVAL  01/17/2012   Procedure: LUMBAR SPINAL CORD STIMULATOR REMOVAL;  Surgeon: Venita Lick, MD;  Location: [facility] OR;  Service: Orthopedics;  Laterality: N/A;  Spinal Cord Battery Removal   TUBAL LIGATION  1988    Family History  Problem Relation Age of Onset   Depression Mother    Ovarian cancer Mother    Hypertension Mother    Hypercholesterolemia Mother    Obesity Mother    Hypertension Sister    Obesity Sister    Stroke Sister    Healthy Sister    Healthy Daughter    Tongue cancer Daughter    Healthy Daughter    Breast cancer Neg Hx     Social History   Socioeconomic History  Marital status: Divorced    Spouse name: Not on file   Number of children: 2   Years of education: Not on file   Highest education level: 12th grade  Occupational History    Employer: UNEMPLOYED    Employer: DISABLED  Tobacco Use   Smoking status: Former    Packs/day: 1.00    Years: 30.00    Total pack years: 30.00    Types: Cigarettes     Start date: 09/10/1986    Quit date: 2018    Years since quitting: 5.5   Smokeless tobacco: Never  Vaping Use   Vaping Use: Every day   Start date: 12/12/2016   Substances: Nicotine  Substance and Sexual Activity   Alcohol use: No    Alcohol/week: 0.0 standard drinks of alcohol   Drug use: No   Sexual activity: Yes    Partners: Male    Birth control/protection: Post-menopausal  Other Topics Concern   Not on file  Social History Narrative   Lives with long term boyfriend   Social Determinants of Health   Financial Resource Strain: Low Risk  (07/13/2021)   Overall Financial Resource Strain (CARDIA)    Difficulty of Paying Living Expenses: Not hard at all  Food Insecurity: No Food Insecurity (07/13/2021)   Hunger Vital Sign    Worried About Running Out of Food in the Last Year: Never true    Ran Out of Food in the Last Year: Never true  Transportation Needs: No Transportation Needs (07/13/2021)   PRAPARE - Administrator, Civil Service (Medical): No    Lack of Transportation (Non-Medical): No  Physical Activity: Inactive (07/13/2021)   Exercise Vital Sign    Days of Exercise per Week: 0 days    Minutes of Exercise per Session: 0 min  Stress: Stress Concern Present (07/13/2021)   [employer] of Occupational Health - Occupational Stress Questionnaire    Feeling of Stress : To some extent  Social Connections: Moderately Isolated (07/13/2021)   Social Connection and Isolation Panel [NHANES]    Frequency of Communication with Friends and Family: More than three times a week    Frequency of Social Gatherings with Friends and Family: More than three times a week    Attends Religious Services: Never    Database administrator or Organizations: No    Attends Banker Meetings: Never    Marital Status: Living with partner  Intimate Partner Violence: Not At Risk (07/13/2021)   Humiliation, Afraid, Rape, and Kick questionnaire    Fear of Current or Ex-Partner: No     Emotionally Abused: No    Physically Abused: No    Sexually Abused: No     Current Outpatient Medications:    Calcium 600-200 MG-UNIT tablet, Take 1 tablet by mouth 2 (two) times daily., Disp: , Rfl:    enoxaparin (LOVENOX) 40 MG/0.4ML injection, Inject 0.4 mLs (40 mg total) into the skin daily for 10 days. 10 day supply 1 injection per day, Disp: 4 mL, Rfl: 0   esomeprazole (NEXIUM) 40 MG capsule, Take 1 capsule (40 mg total) by mouth 2 (two) times daily., Disp: 180 capsule, Rfl: 1   eszopiclone 3 MG TABS, Take 1 tablet (3 mg total) by mouth at bedtime. Take immediately before bedtime, Disp: 90 tablet, Rfl: 0   famotidine (PEPCID) 20 MG tablet, Take 1 tablet (20 mg total) by mouth 2 (two) times daily. (Patient taking differently: Take 20 mg by mouth in the morning,  at noon, and at bedtime.), Disp: 180 tablet, Rfl: 1   Fluticasone-Umeclidin-Vilant (TRELEGY ELLIPTA) 100-62.5-25 MCG/INH AEPB, Inhale 1 puff into the lungs daily. (Patient taking differently: Inhale 1 puff into the lungs daily as needed (respiratory issues.).), Disp: 180 each, Rfl: 0   ondansetron (ZOFRAN) 4 MG tablet, Take 1 tablet (4 mg total) by mouth every 8 (eight) hours as needed for nausea or vomiting., Disp: 20 tablet, Rfl: 0   pregabalin (LYRICA) 75 MG capsule, Take 1 capsule (75 mg total) by mouth 3 (three) times daily., Disp: 90 capsule, Rfl: 0   rosuvastatin (CRESTOR) 10 MG tablet, Take 1 tablet (10 mg total) by mouth daily., Disp: 90 tablet, Rfl: 3   venlafaxine XR (EFFEXOR-XR) 150 MG 24 hr capsule, TAKE 1 CAPSULE(150 MG) BY MOUTH DAILY WITH BREAKFAST, Disp: 90 capsule, Rfl: 1  Allergies  Allergen Reactions   Aspirin Other (See Comments)    Causes heartburn even the chewable kind   Clonidine Derivatives Itching    I personally reviewed active problem list, medication list, allergies, family history, social history, health maintenance with the patient/caregiver today.   ROS ***  Objective  Virtual encounter,  vitals not obtained.  There is no height or weight on file to calculate BMI.  Physical Exam ***  No results found for this or any previous visit (from the past 72 hour(s)).  PHQ2/9:    07/24/2021    1:29 PM 07/13/2021   11:23 AM 06/22/2021    2:56 PM 01/23/2021    1:38 PM 09/22/2020   11:09 AM  Depression screen PHQ 2/9  Decreased Interest 2 3 3  0 0  Down, Depressed, Hopeless 2 3 3  0 0  PHQ - 2 Score 4 6 6  0 0  Altered sleeping 3 3 3  0 3  Tired, decreased energy 3 3 0 0 0  Change in appetite 0 0 0 0 0  Feeling bad or failure about yourself  0 3 3 0 0  Trouble concentrating 2 1 1  0 0  Moving slowly or fidgety/restless 0 0 0 0 0  Suicidal thoughts 0 0 0 0 0  PHQ-9 Score 12 16 13  0 3  Difficult doing work/chores Very difficult Not difficult at all      PHQ-2/9 Result is {gen negative/positive:315881}.    Fall Risk:    07/24/2021    1:22 PM 07/13/2021   11:24 AM 07/13/2021    9:49 AM 06/22/2021    2:56 PM 01/23/2021    1:37 PM  Fall Risk   Falls in the past year? 0 0 0 0   Number falls in past yr:  0 0 0 0  Injury with Fall?  0 0 0 0  Risk for fall due to : No Fall Risks No Fall Risks  No Fall Risks No Fall Risks  Follow up Falls prevention discussed Falls prevention discussed  Falls prevention discussed Falls prevention discussed     Assessment & Plan  There are no diagnoses linked to this encounter.  I discussed the assessment and treatment plan with the patient. The patient was provided an opportunity to ask questions and all were answered. The patient agreed with the plan and demonstrated an understanding of the instructions.  The patient was advised to call back or seek an in-person evaluation if the symptoms worsen or if the condition fails to improve as anticipated.  I provided *** minutes of non-face-to-face time during this encounter.

## 2021-10-12 ENCOUNTER — Telehealth: Payer: Medicare PPO | Admitting: Family Medicine

## 2021-10-14 DIAGNOSIS — M47896 Other spondylosis, lumbar region: Secondary | ICD-10-CM | POA: Diagnosis not present

## 2021-10-19 ENCOUNTER — Other Ambulatory Visit: Payer: Self-pay | Admitting: Family Medicine

## 2021-10-19 DIAGNOSIS — F5104 Psychophysiologic insomnia: Secondary | ICD-10-CM

## 2021-10-19 MED ORDER — ESZOPICLONE 3 MG PO TABS: 3.0000 mg | ORAL_TABLET | Freq: Every day | ORAL | 0 refills | Status: DC

## 2021-10-24 ENCOUNTER — Ambulatory Visit: Payer: Medicare PPO | Admitting: Family Medicine

## 2021-11-02 DIAGNOSIS — M47816 Spondylosis without myelopathy or radiculopathy, lumbar region: Secondary | ICD-10-CM | POA: Diagnosis not present
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
## 2021-11-10 ENCOUNTER — Encounter: Payer: Self-pay | Admitting: Family Medicine

## 2021-11-10 ENCOUNTER — Ambulatory Visit: Payer: Medicare PPO | Admitting: Family Medicine

## 2021-11-10 VITALS — BP 108/70 | HR 85 | Resp 16 | Ht 69.0 in | Wt 166.0 lb

## 2021-11-10 DIAGNOSIS — J432 Centrilobular emphysema: Secondary | ICD-10-CM | POA: Diagnosis not present

## 2021-11-10 DIAGNOSIS — I700 Atherosclerosis of aorta: Secondary | ICD-10-CM | POA: Diagnosis not present

## 2021-11-10 DIAGNOSIS — K219 Gastro-esophageal reflux disease without esophagitis: Secondary | ICD-10-CM

## 2021-11-10 DIAGNOSIS — F5104 Psychophysiologic insomnia: Secondary | ICD-10-CM | POA: Insufficient documentation

## 2021-11-10 DIAGNOSIS — E2839 Other primary ovarian failure: Secondary | ICD-10-CM | POA: Diagnosis not present

## 2021-11-10 DIAGNOSIS — Z1231 Encounter for screening mammogram for malignant neoplasm of breast: Secondary | ICD-10-CM | POA: Diagnosis not present

## 2021-11-10 DIAGNOSIS — K2270 Barrett's esophagus without dysplasia: Secondary | ICD-10-CM

## 2021-11-10 DIAGNOSIS — D692 Other nonthrombocytopenic purpura: Secondary | ICD-10-CM

## 2021-11-10 DIAGNOSIS — Z79899 Other long term (current) drug therapy: Secondary | ICD-10-CM | POA: Diagnosis not present

## 2021-11-10 DIAGNOSIS — Z23 Encounter for immunization: Secondary | ICD-10-CM | POA: Diagnosis not present

## 2021-11-10 DIAGNOSIS — Z13820 Encounter for screening for osteoporosis: Secondary | ICD-10-CM

## 2021-11-10 MED ORDER — SHINGRIX 50 MCG/0.5ML IM SUSR: 0.5000 mL | Freq: Once | INTRAMUSCULAR | 0 refills | Status: AC

## 2021-11-10 MED ORDER — TETANUS-DIPHTH-ACELL PERTUSSIS 5-2-15.5 LF-MCG/0.5 IM SUSP: 0.5000 mL | Freq: Once | INTRAMUSCULAR | 0 refills | Status: AC

## 2021-11-10 NOTE — Patient Instructions (Signed)
[facility] for CT ordered by Kandice Robinsons, NP: [id number]

## 2021-11-11 LAB — CBC WITH DIFFERENTIAL/PLATELET
Absolute Monocytes: 705 cells/uL (ref 200–950)
Basophils Absolute: 77 cells/uL (ref 0–200)
Basophils Relative: 0.9 %
Eosinophils Absolute: 671 cells/uL — ABNORMAL HIGH (ref 15–500)
Eosinophils Relative: 7.8 %
HCT: 39.3 % (ref 35.0–45.0)
Hemoglobin: 13.4 g/dL (ref 11.7–15.5)
Lymphs Abs: 2589 cells/uL (ref 850–3900)
MCH: 31.7 pg (ref 27.0–33.0)
MCHC: 34.1 g/dL (ref 32.0–36.0)
MCV: 92.9 fL (ref 80.0–100.0)
MPV: 10 fL (ref 7.5–12.5)
Monocytes Relative: 8.2 %
Neutro Abs: 4558 cells/uL (ref 1500–7800)
Neutrophils Relative %: 53 %
Platelets: 337 10*3/uL (ref 140–400)
RBC: 4.23 10*6/uL (ref 3.80–5.10)
RDW: 12.2 % (ref 11.0–15.0)
Total Lymphocyte: 30.1 %
WBC: 8.6 10*3/uL (ref 3.8–10.8)

## 2021-11-11 LAB — LIPID PANEL
Cholesterol: 129 mg/dL (ref <200)
HDL: 59 mg/dL (ref >=50)
LDL Cholesterol (Calc): 56 mg/dL (calc)
Non-HDL Cholesterol (Calc): 70 mg/dL (calc) (ref <130)
Total CHOL/HDL Ratio: 2.2 (calc) (ref <5.0)
Triglycerides: 68 mg/dL (ref <150)

## 2021-11-11 LAB — COMPLETE METABOLIC PANEL WITH GFR
AG Ratio: 1.4 (calc) (ref 1.0–2.5)
ALT: 22 U/L (ref 6–29)
AST: 17 U/L (ref 10–35)
Albumin: 4.2 g/dL (ref 3.6–5.1)
Alkaline phosphatase (APISO): 81 U/L (ref 37–153)
BUN: 16 mg/dL (ref 7–25)
CO2: 26 mmol/L (ref 20–32)
Calcium: 9.4 mg/dL (ref 8.6–10.4)
Chloride: 102 mmol/L (ref 98–110)
Creat: 0.86 mg/dL (ref 0.50–1.03)
Globulin: 3 g/dL (calc) (ref 1.9–3.7)
Glucose, Bld: 87 mg/dL (ref 65–99)
Potassium: 4.8 mmol/L (ref 3.5–5.3)
Sodium: 137 mmol/L (ref 135–146)
Total Bilirubin: 0.3 mg/dL (ref 0.2–1.2)
Total Protein: 7.2 g/dL (ref 6.1–8.1)
eGFR: 79 mL/min/{1.73_m2} (ref >=60)

## 2021-11-17 ENCOUNTER — Other Ambulatory Visit: Payer: Self-pay

## 2021-11-17 ENCOUNTER — Telehealth: Payer: Self-pay

## 2021-11-17 DIAGNOSIS — K219 Gastro-esophageal reflux disease without esophagitis: Secondary | ICD-10-CM

## 2021-11-17 NOTE — Telephone Encounter (Signed)
Schedule for 12/20/2021

## 2021-11-17 NOTE — Telephone Encounter (Signed)
Patient has appointment with you on 09//02/2022 and she wants to know if she can just schedule a EGD with out a appointment

## 2021-11-17 NOTE — Telephone Encounter (Signed)
Okay, please go ahead and schedule EGD directly. Dx: Chronic GERD, PPI dependent, evaluate for Barrett's esophagus  RV

## 2021-11-21 ENCOUNTER — Ambulatory Visit: Payer: Medicare PPO | Admitting: Gastroenterology

## 2021-12-05 ENCOUNTER — Ambulatory Visit: Payer: Medicare PPO | Admitting: Gastroenterology

## 2021-12-21 ENCOUNTER — Encounter: Payer: Self-pay | Admitting: Gastroenterology

## 2021-12-21 ENCOUNTER — Ambulatory Visit: Payer: Medicare PPO | Admitting: Anesthesiology

## 2021-12-21 ENCOUNTER — Encounter
Admission: RE | Disposition: A | Discharge status: Home / Self Care | Payer: Self-pay | Source: Home / Self Care | Attending: Gastroenterology

## 2021-12-21 ENCOUNTER — Ambulatory Visit
Admission: RE | Admit: 2021-12-21 | Discharge: 2021-12-21 | Disposition: A | Discharge status: Home / Self Care | Payer: Medicare PPO | Attending: Gastroenterology | Admitting: Gastroenterology

## 2021-12-21 DIAGNOSIS — Z87891 Personal history of nicotine dependence: Secondary | ICD-10-CM | POA: Diagnosis not present

## 2021-12-21 DIAGNOSIS — K2900 Acute gastritis without bleeding: Secondary | ICD-10-CM | POA: Diagnosis not present

## 2021-12-21 DIAGNOSIS — G709 Myoneural disorder, unspecified: Secondary | ICD-10-CM | POA: Diagnosis not present

## 2021-12-21 DIAGNOSIS — K3189 Other diseases of stomach and duodenum: Secondary | ICD-10-CM | POA: Diagnosis not present

## 2021-12-21 DIAGNOSIS — J449 Chronic obstructive pulmonary disease, unspecified: Secondary | ICD-10-CM | POA: Insufficient documentation

## 2021-12-21 DIAGNOSIS — T7840XA Allergy, unspecified, initial encounter: Secondary | ICD-10-CM | POA: Diagnosis not present

## 2021-12-21 DIAGNOSIS — K319 Disease of stomach and duodenum, unspecified: Secondary | ICD-10-CM

## 2021-12-21 DIAGNOSIS — K219 Gastro-esophageal reflux disease without esophagitis: Secondary | ICD-10-CM | POA: Diagnosis not present

## 2021-12-21 DIAGNOSIS — G90519 Complex regional pain syndrome I of unspecified upper limb: Secondary | ICD-10-CM | POA: Insufficient documentation

## 2021-12-21 DIAGNOSIS — F419 Anxiety disorder, unspecified: Secondary | ICD-10-CM | POA: Diagnosis not present

## 2021-12-21 DIAGNOSIS — M1990 Unspecified osteoarthritis, unspecified site: Secondary | ICD-10-CM | POA: Insufficient documentation

## 2021-12-21 DIAGNOSIS — K2270 Barrett's esophagus without dysplasia: Secondary | ICD-10-CM | POA: Diagnosis not present

## 2021-12-21 DIAGNOSIS — K2960 Other gastritis without bleeding: Secondary | ICD-10-CM | POA: Insufficient documentation

## 2021-12-21 HISTORY — PX: ESOPHAGOGASTRODUODENOSCOPY (EGD) WITH PROPOFOL: SHX5813

## 2021-12-21 SURGERY — ESOPHAGOGASTRODUODENOSCOPY (EGD) WITH PROPOFOL
Anesthesia: General

## 2021-12-21 MED ORDER — PROPOFOL 10 MG/ML IV BOLUS
INTRAVENOUS | Status: DC | PRN
  Administered 2021-12-21: 80 mg via INTRAVENOUS

## 2021-12-21 MED ORDER — SODIUM CHLORIDE 0.9 % IV SOLN: INTRAVENOUS | Status: DC

## 2021-12-21 MED ORDER — LIDOCAINE HCL (CARDIAC) PF 100 MG/5ML IV SOSY
PREFILLED_SYRINGE | INTRAVENOUS | Status: DC | PRN
  Administered 2021-12-21: 50 mg via INTRAVENOUS

## 2021-12-21 MED ORDER — PROPOFOL 500 MG/50ML IV EMUL
INTRAVENOUS | Status: DC | PRN
  Administered 2021-12-21: 140 ug/kg/min via INTRAVENOUS

## 2021-12-21 NOTE — Anesthesia Postprocedure Evaluation (Signed)
Anesthesia Post Note  Patient: Tara [name]  Procedure(s) Performed: ESOPHAGOGASTRODUODENOSCOPY (EGD) WITH PROPOFOL  Patient location during evaluation: Endoscopy Anesthesia Type: General Level of consciousness: awake and alert Pain management: pain level controlled Vital Signs Assessment: post-procedure vital signs reviewed and stable Respiratory status: spontaneous breathing, nonlabored ventilation and respiratory function stable Cardiovascular status: blood pressure returned to baseline and stable Postop Assessment: no apparent nausea or vomiting Anesthetic complications: no   No notable events documented.   Last Vitals:  Vitals:   12/21/21 0947 12/21/21 0953  BP:    Pulse: 61   Resp: 14   Temp:  (!) 35.9 C  SpO2: 99%     Last Pain:  Vitals:   12/21/21 0953  TempSrc: Temporal  PainSc: 0-No pain                 [place] Ouch

## 2021-12-21 NOTE — Transfer of Care (Signed)
Immediate Anesthesia Transfer of Care Note  Patient: Tara [name]  Procedure(s) Performed: ESOPHAGOGASTRODUODENOSCOPY (EGD) WITH PROPOFOL  Patient Location: PACU  Anesthesia Type:General  Level of Consciousness: sedated  Airway & Oxygen Therapy: Patient Spontanous Breathing  Post-op Assessment: Report given to RN and Post -op Vital signs reviewed and stable  Post vital signs: Reviewed and stable  Last Vitals:  Vitals Value Taken Time  BP 104/57 12/21/21 0947  Temp    Pulse 61 12/21/21 0947  Resp 14 12/21/21 0947  SpO2 99 % 12/21/21 0947  Vitals shown include unvalidated device data.  Last Pain:  Vitals:   12/21/21 0946  TempSrc:   PainSc: 0-No pain         Complications: No notable events documented.

## 2021-12-21 NOTE — H&P (Signed)
Cephas Darby, MD [address]  [place]  [place], [place] [address]  Main: [phone]  Fax: [phone] Pager: [phone]  Primary Care Physician:  Steele Sizer, MD Primary Gastroenterologist:  Dr. Cephas Darby  Pre-Procedure History & Physical: HPI:  Tara [name] is a 56 y.o. female is here for an endoscopy.   Past Medical History:  Diagnosis Date   Anxiety    Arthritis    arthritis in back   Barrett's esophagus    COPD (chronic obstructive pulmonary disease) ([place])    uses spiriva   CPRS 1 (complex regional pain syndrome I) of upper limb 2007   formerly called rsd   Depression    being treated for depression   GERD (gastroesophageal reflux disease)    H/O degenerative disc disease    chronic back pain   Headache(784.0)    headaches due to pain   Neuromuscular disorder ([place])    rsd   Pneumonia    2011 - hospitalized for 4 days   Pneumonia    Reflex sympathetic dystrophy    Tobacco abuse     Past Surgical History:  Procedure Laterality Date   ABDOMINAL EXPOSURE N/A 07/27/2021   Procedure: ABDOMINAL EXPOSURE FOR ANTERIOR SPINE SURGERY;  Surgeon: Cherre Robins, MD;  Location: [place];  Service: Vascular;  Laterality: N/A;   ABDOMINAL HYSTERECTOMY  2005   partial   ANTERIOR AND POSTERIOR SPINAL FUSION N/A 07/27/2021   Procedure: ANTERIOR LUMBAR INTERBODY FUSION AND POSTERIOR SPINAL FUSION WITH PEDICLE SCREWS LUMBAR FIVE TO SACRAL ONE;  Surgeon: Melina Schools, MD;  Location: [facility];  Service: Orthopedics;  Laterality: N/A;  4.5 hrs Dr. Stanford Breed to do approach left tap block with exparel 3 C-Bed   CERVICAL DISC ARTHROPLASTY  2008   ESOPHAGOGASTRODUODENOSCOPY (EGD) WITH PROPOFOL N/A 06/04/2017   Procedure: ESOPHAGOGASTRODUODENOSCOPY (EGD) WITH PROPOFOL;  Surgeon: Lollie Sails, MD;  Location: [facility] ENDOSCOPY;  Service: Endoscopy;  Laterality: N/A;   KNEE ARTHROSCOPY Right 2008   LUMBAR LAMINECTOMY/DECOMPRESSION MICRODISCECTOMY  01/25/2011    Procedure: LUMBAR LAMINECTOMY/DECOMPRESSION MICRODISCECTOMY;  Surgeon: Dahlia Bailiff;  Location: [facility];  Service: Orthopedics;  Laterality: Left;  LEFT L5-S1 MICRODISCECTOMY, Central Decompression Lumbar five-sacral one   OOPHORECTOMY Left 2011   OOPHORECTOMY Right 10/2012   SPINAL CORD STIMULATOR IMPLANT  2009   Removal-01/2012   SPINAL CORD STIMULATOR REMOVAL  01/17/2012   Procedure: LUMBAR SPINAL CORD STIMULATOR REMOVAL;  Surgeon: Melina Schools, MD;  Location: [facility];  Service: Orthopedics;  Laterality: N/A;  Spinal Cord Battery Removal   TUBAL LIGATION  1988    Prior to Admission medications   Medication Sig Start Date End Date Taking? Authorizing Provider  Calcium 600-200 MG-UNIT tablet Take 1 tablet by mouth 2 (two) times daily.   Yes [provider]  esomeprazole (NEXIUM) 40 MG capsule Take 1 capsule (40 mg total) by mouth 2 (two) times daily. 09/18/21  Yes Bo Merino, FNP  Fluticasone-Umeclidin-Vilant (TRELEGY ELLIPTA) 100-62.5-25 MCG/INH AEPB Inhale 1 puff into the lungs daily. Patient taking differently: Inhale 1 puff into the lungs daily as needed (respiratory issues.). 09/22/20  Yes Sowles, Drue Stager, MD  rosuvastatin (CRESTOR) 10 MG tablet Take 1 tablet (10 mg total) by mouth daily. 07/24/21  Yes Sowles, Drue Stager, MD  venlafaxine XR (EFFEXOR-XR) 150 MG 24 hr capsule TAKE 1 CAPSULE(150 MG) BY MOUTH DAILY WITH BREAKFAST 07/24/21  Yes Sowles, Drue Stager, MD  eszopiclone 3 MG TABS Take 1 tablet (3 mg total) by mouth at bedtime.  Take immediately before bedtime 10/19/21   Steele Sizer, MD  famotidine (PEPCID) 20 MG tablet Take 1 tablet (20 mg total) by mouth 2 (two) times daily. Patient taking differently: Take 20 mg by mouth in the morning, at noon, and at bedtime. 09/22/20   Steele Sizer, MD  pregabalin (LYRICA) 75 MG capsule Take 1 capsule (75 mg total) by mouth 3 (three) times daily. Patient not taking: Reported on 12/21/2021 10/09/21   Steele Sizer, MD    Allergies as  of 11/17/2021 - Review Complete 11/10/2021  Allergen Reaction Noted   Aspirin Other (See Comments) 01/17/2016   Clonidine derivatives Itching 01/22/2011    Family History  Problem Relation Age of Onset   Depression Mother    Ovarian cancer Mother    Hypertension Mother    Hypercholesterolemia Mother    Obesity Mother    Hypertension Sister    Obesity Sister    Stroke Sister    Healthy Sister    Healthy Daughter    Tongue cancer Daughter    Healthy Daughter    Breast cancer Neg Hx     Social History   Socioeconomic History   Marital status: Divorced    Spouse name: Not on file   Number of children: 2   Years of education: Not on file   Highest education level: 12th grade  Occupational History    Employer: UNEMPLOYED    Employer: DISABLED  Tobacco Use   Smoking status: Former    Packs/day: 1.00    Years: 30.00    Total pack years: 30.00    Types: Cigarettes    Start date: 09/10/1986    Quit date: 2018    Years since quitting: 5.7   Smokeless tobacco: Never  Vaping Use   Vaping Use: Every day   Start date: 12/12/2016   Substances: Nicotine  Substance and Sexual Activity   Alcohol use: No    Alcohol/week: 0.0 standard drinks of alcohol   Drug use: No   Sexual activity: Yes    Partners: Male    Birth control/protection: Post-menopausal  Other Topics Concern   Not on file  Social History Narrative   Lives with long term boyfriend   Social Determinants of Health   Financial Resource Strain: Low Risk  (07/13/2021)   Overall Financial Resource Strain (CARDIA)    Difficulty of Paying Living Expenses: Not hard at all  Food Insecurity: No Food Insecurity (07/13/2021)   Hunger Vital Sign    Worried About Running Out of Food in the Last Year: Never true    [place] in the Last Year: Never true  Transportation Needs: No Transportation Needs (07/13/2021)   PRAPARE - Hydrologist (Medical): No    Lack of Transportation (Non-Medical): No   Physical Activity: Inactive (07/13/2021)   Exercise Vital Sign    Days of Exercise per Week: 0 days    Minutes of Exercise per Session: 0 min  Stress: Stress Concern Present (07/13/2021)   [place]    Feeling of Stress : To some extent  Social Connections: Moderately Isolated (07/13/2021)   Social Connection and Isolation Panel [NHANES]    Frequency of Communication with Friends and Family: More than three times a week    Frequency of Social Gatherings with Friends and Family: More than three times a week    Attends Religious Services: Never    Marine scientist or Organizations:  No    Attends Club or Organization Meetings: Never    Marital Status: Living with partner  Intimate Partner Violence: Not At Risk (07/13/2021)   Humiliation, Afraid, Rape, and Kick questionnaire    Fear of Current or Ex-Partner: No    Emotionally Abused: No    Physically Abused: No    Sexually Abused: No    Review of Systems: See HPI, otherwise negative ROS  Physical Exam: BP (!) 129/54   Pulse 77   Temp (!) 96.7 F (35.9 C) (Temporal)   Resp 18   Ht 5\' 9"  (1.753 m)   Wt 72.6 kg   SpO2 97%   BMI 23.63 kg/m  General:   Alert,  pleasant and cooperative in NAD Head:  Normocephalic and atraumatic. Neck:  Supple; no masses or thyromegaly. Lungs:  Clear throughout to auscultation.    Heart:  Regular rate and rhythm. Abdomen:  Soft, nontender and nondistended. Normal bowel sounds, without guarding, and without rebound.   Neurologic:  Alert and  oriented x4;  grossly normal neurologically.  Impression/Plan: Tara [name] is here for an endoscopy to be performed for Chronic GERD, PPI dependent, evaluate for Barrett's esophagus  Risks, benefits, limitations, and alternatives regarding  endoscopy have been reviewed with the patient.  Questions have been answered.  All parties agreeable.   Sherri Sear, MD  12/21/2021, 9:16 AM

## 2021-12-21 NOTE — Anesthesia Procedure Notes (Signed)
Date/Time: 12/21/2021 9:30 AM  Performed by: Johnna Acosta, CRNAPre-anesthesia Checklist: Patient identified, Emergency Drugs available, Suction available and Timeout performed Patient Re-evaluated:Patient Re-evaluated prior to induction Oxygen Delivery Method: Nasal cannula Preoxygenation: Pre-oxygenation with 100% oxygen Induction Type: IV induction

## 2021-12-21 NOTE — Anesthesia Preprocedure Evaluation (Addendum)
Anesthesia Evaluation  Patient identified by MRN, date of birth, ID band Patient awake    Reviewed: Allergy & Precautions, NPO status , Patient's Chart, lab work & pertinent test results  Airway Mallampati: II  TM Distance: >3 FB Neck ROM: Full    Dental  (+) Dental Advisory Given   Pulmonary COPD, former smoker,    breath sounds clear to auscultation       Cardiovascular negative cardio ROS   Rhythm:Regular Rate:Normal     Neuro/Psych PSYCHIATRIC DISORDERS Anxiety CRPS 1 upper limb Spinal cord stimulator nonfunctional  Neuromuscular disease    GI/Hepatic Neg liver ROS, GERD  ,  Endo/Other  negative endocrine ROS  Renal/GU negative Renal ROS     Musculoskeletal  (+) Arthritis ,   Abdominal Normal abdominal exam  (+)   Peds  Hematology negative hematology ROS (+)   Anesthesia Other Findings   Reproductive/Obstetrics                            Lab Results  Component Value Date   WBC 8.6 11/10/2021   HGB 13.4 11/10/2021   HCT 39.3 11/10/2021   MCV 92.9 11/10/2021   PLT 337 11/10/2021   Lab Results  Component Value Date   CREATININE 0.86 11/10/2021   BUN 16 11/10/2021   NA 137 11/10/2021   K 4.8 11/10/2021   CL 102 11/10/2021   CO2 26 11/10/2021    Anesthesia Physical  Anesthesia Plan  ASA: 2  Anesthesia Plan: General   Post-op Pain Management: Minimal or no pain anticipated   Induction: Intravenous  PONV Risk Score and Plan: 3 and Propofol infusion and TIVA  Airway Management Planned: Natural Airway  Additional Equipment:   Intra-op Plan:   Post-operative Plan:   Informed Consent: I have reviewed the patients History and Physical, chart, labs and discussed the procedure including the risks, benefits and alternatives for the proposed anesthesia with the patient or authorized representative who has indicated his/her understanding and acceptance.     Dental  advisory given  Plan Discussed with: CRNA  Anesthesia Plan Comments:        Anesthesia Quick Evaluation

## 2021-12-21 NOTE — Op Note (Signed)
[facility] Gastroenterology Patient Name: Tara [name] Procedure Date: 12/21/2021 9:26 AM MRN: [id number] Account #: [id number] Date of Birth: November 07, 1965 Admit Type: Outpatient Age: 56 Room: [facility] ENDO ROOM 3 Gender: Female Note Status: Finalized Instrument Name: Upper Endoscope [id number] Procedure:             Upper GI endoscopy Indications:           Esophageal reflux symptoms that persist despite                         appropriate therapy Providers:             Lin Landsman MD, MD Referring MD:          Bethena Roys. Sowles, MD (Referring MD) Medicines:             General Anesthesia Complications:         No immediate complications. Procedure:             Pre-Anesthesia Assessment:                        - Prior to the procedure, a History and Physical was                         performed, and patient medications and allergies were                         reviewed. The patient is competent. The risks and                         benefits of the procedure and the sedation options and                         risks were discussed with the patient. All questions                         were answered and informed consent was obtained.                         Patient identification and proposed procedure were                         verified by the physician, the nurse, the                         anesthesiologist, the anesthetist and the technician                         in the pre-procedure area in the procedure room in the                         endoscopy suite. Mental Status Examination: alert and                         oriented. Airway Examination: normal oropharyngeal                         airway and neck mobility. Respiratory Examination:  clear to auscultation. CV Examination: normal.                         Prophylactic Antibiotics: The patient does not require                         prophylactic antibiotics. Prior  Anticoagulants: The                         patient has taken no previous anticoagulant or                         antiplatelet agents. ASA Grade Assessment: II - A                         patient with mild systemic disease. After reviewing                         the risks and benefits, the patient was deemed in                         satisfactory condition to undergo the procedure. The                         anesthesia plan was to use general anesthesia.                         Immediately prior to administration of medications,                         the patient was re-assessed for adequacy to receive                         sedatives. The heart rate, respiratory rate, oxygen                         saturations, blood pressure, adequacy of pulmonary                         ventilation, and response to care were monitored                         throughout the procedure. The physical status of the                         patient was re-assessed after the procedure.                        After obtaining informed consent, the endoscope was                         passed under direct vision. Throughout the procedure,                         the patient's blood pressure, pulse, and oxygen                         saturations were monitored continuously. The  Endosonoscope was introduced through the mouth, and                         advanced to the second part of duodenum. The upper GI                         endoscopy was accomplished without difficulty. The                         patient tolerated the procedure well. Findings:      The duodenal bulb and second portion of the duodenum were normal.      Diffuse mildly erythematous mucosa without bleeding was found in the       gastric antrum. Biopsies were taken with a cold forceps for histology.      Islands of salmon-colored mucosa were present from 39 to 40 cm. No other       visible abnormalities were present.  The maximum longitudinal extent of       these esophageal mucosal changes was 1 cm in length. Biopsies were taken       with a cold forceps for histology.      The examined esophagus was normal. Biopsies were taken with a cold       forceps for histology. Impression:            - Normal duodenal bulb and second portion of the                         duodenum.                        - Erythematous mucosa in the antrum. Biopsied.                        - Salmon-colored mucosa suspicious for short-segment                         Barrett's esophagus. Biopsied.                        - Normal esophagus. Biopsied. Recommendation:        - Await pathology results.                        - Discharge patient to home (with escort).                        - Resume previous diet today.                        - Continue present medications.                        - Follow an antireflux regimen indefinitely. Procedure Code(s):     --- Professional ---                        [phone], Esophagogastroduodenoscopy, flexible,                         transoral; with biopsy, single or multiple Diagnosis Code(s):     --- Professional ---  K22.8, Other specified diseases of esophagus                        K31.89, Other diseases of stomach and duodenum                        K21.9, Gastro-esophageal reflux disease without                         esophagitis CPT copyright 2019 American Medical Association. All rights reserved. The codes documented in this report are preliminary and upon coder review may  be revised to meet current compliance requirements. Dr. Ulyess Mort Lin Landsman MD, MD 12/21/2021 9:45:23 AM This report has been signed electronically. Number of Addenda: 0 Note Initiated On: 12/21/2021 9:26 AM Estimated Blood Loss:  Estimated blood loss: none.      [facility]

## 2021-12-22 ENCOUNTER — Encounter: Payer: Self-pay | Admitting: Gastroenterology

## 2021-12-22 LAB — SURGICAL PATHOLOGY

## 2021-12-25 ENCOUNTER — Encounter: Payer: Self-pay | Admitting: Gastroenterology

## 2022-01-03 ENCOUNTER — Ambulatory Visit
Admission: RE | Admit: 2022-01-03 | Discharge: 2022-01-03 | Disposition: A | Discharge status: Home / Self Care | Payer: Medicare PPO | Source: Ambulatory Visit | Attending: Family Medicine | Admitting: Family Medicine

## 2022-01-03 DIAGNOSIS — Z780 Asymptomatic menopausal state: Secondary | ICD-10-CM | POA: Diagnosis not present

## 2022-01-03 DIAGNOSIS — Z1231 Encounter for screening mammogram for malignant neoplasm of breast: Secondary | ICD-10-CM | POA: Diagnosis not present

## 2022-01-03 DIAGNOSIS — E2839 Other primary ovarian failure: Secondary | ICD-10-CM | POA: Diagnosis not present

## 2022-01-03 DIAGNOSIS — Z13820 Encounter for screening for osteoporosis: Secondary | ICD-10-CM | POA: Insufficient documentation

## 2022-01-03 DIAGNOSIS — M85851 Other specified disorders of bone density and structure, right thigh: Secondary | ICD-10-CM | POA: Diagnosis not present

## 2022-02-03 ENCOUNTER — Other Ambulatory Visit: Payer: Self-pay | Admitting: Family Medicine

## 2022-02-03 DIAGNOSIS — F325 Major depressive disorder, single episode, in full remission: Secondary | ICD-10-CM

## 2022-02-14 ENCOUNTER — Other Ambulatory Visit: Payer: Self-pay | Admitting: Family Medicine

## 2022-02-14 ENCOUNTER — Telehealth: Payer: Self-pay | Admitting: Family Medicine

## 2022-02-14 DIAGNOSIS — F5104 Psychophysiologic insomnia: Secondary | ICD-10-CM

## 2022-02-14 NOTE — Telephone Encounter (Signed)
Appt sch'd for tomorrow with Dr Carlynn Purl
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
## 2022-02-14 NOTE — Telephone Encounter (Signed)
Pt is scheduled °

## 2022-02-15 ENCOUNTER — Encounter: Payer: Self-pay | Admitting: Family Medicine

## 2022-02-15 ENCOUNTER — Ambulatory Visit: Payer: Medicare PPO | Admitting: Family Medicine

## 2022-02-15 VITALS — BP 120/68 | HR 87 | Resp 16 | Ht 68.0 in | Wt 167.0 lb

## 2022-02-15 DIAGNOSIS — D692 Other nonthrombocytopenic purpura: Secondary | ICD-10-CM

## 2022-02-15 DIAGNOSIS — I700 Atherosclerosis of aorta: Secondary | ICD-10-CM

## 2022-02-15 DIAGNOSIS — K219 Gastro-esophageal reflux disease without esophagitis: Secondary | ICD-10-CM

## 2022-02-15 DIAGNOSIS — G90511 Complex regional pain syndrome I of right upper limb: Secondary | ICD-10-CM | POA: Diagnosis not present

## 2022-02-15 DIAGNOSIS — M8580 Other specified disorders of bone density and structure, unspecified site: Secondary | ICD-10-CM | POA: Diagnosis not present

## 2022-02-15 DIAGNOSIS — K2270 Barrett's esophagus without dysplasia: Secondary | ICD-10-CM | POA: Diagnosis not present

## 2022-02-15 DIAGNOSIS — Z780 Asymptomatic menopausal state: Secondary | ICD-10-CM

## 2022-02-15 DIAGNOSIS — J432 Centrilobular emphysema: Secondary | ICD-10-CM

## 2022-02-15 DIAGNOSIS — F5104 Psychophysiologic insomnia: Secondary | ICD-10-CM

## 2022-02-15 MED ORDER — ESZOPICLONE 3 MG PO TABS: 3.0000 mg | ORAL_TABLET | Freq: Every day | ORAL | 1 refills | Status: DC

## 2022-02-15 MED ORDER — TRELEGY ELLIPTA 100-62.5-25 MCG/ACT IN AEPB: 1.0000 | INHALATION_SPRAY | Freq: Every day | RESPIRATORY_TRACT | 5 refills | Status: DC

## 2022-02-15 MED ORDER — ESOMEPRAZOLE MAGNESIUM 40 MG PO CPDR: 40.0000 mg | DELAYED_RELEASE_CAPSULE | Freq: Two times a day (BID) | ORAL | 1 refills | Status: DC

## 2022-02-15 MED ORDER — FAMOTIDINE 20 MG PO TABS: 20.0000 mg | ORAL_TABLET | Freq: Two times a day (BID) | ORAL | 1 refills | Status: DC

## 2022-02-15 NOTE — Patient Instructions (Signed)
[facility]: [phone]

## 2022-02-19 ENCOUNTER — Telehealth: Payer: Self-pay

## 2022-02-19 DIAGNOSIS — K219 Gastro-esophageal reflux disease without esophagitis: Secondary | ICD-10-CM

## 2022-02-19 NOTE — Telephone Encounter (Signed)
Patient had a a EGD done on 12/21/21. She states the omeprazole 40mg  twice a day and Pepcid 20mg  daily. She states she is ready to have acid reflex surgery and is wanting a referral

## 2022-02-20 NOTE — Telephone Encounter (Signed)
Please refer her to Dr. Everlene Farrier for antireflux surgery Dx: Refractory GERD, PPI dependent  RV

## 2022-02-20 NOTE — Telephone Encounter (Signed)
Called and left patient a detail message and placed referral

## 2022-02-20 NOTE — Addendum Note (Signed)
Addended by: Radene Knee L on: 02/20/2022 12:39 PM   Modules accepted: Orders

## 2022-02-26 ENCOUNTER — Ambulatory Visit (INDEPENDENT_AMBULATORY_CARE_PROVIDER_SITE_OTHER): Payer: Medicare PPO | Admitting: Surgery

## 2022-02-26 ENCOUNTER — Encounter: Payer: Self-pay | Admitting: Surgery

## 2022-02-26 VITALS — BP 117/77 | HR 83 | Temp 98.3°F | Ht 68.0 in | Wt 167.2 lb

## 2022-02-26 DIAGNOSIS — K219 Gastro-esophageal reflux disease without esophagitis: Secondary | ICD-10-CM | POA: Diagnosis not present

## 2022-02-26 NOTE — Patient Instructions (Addendum)
Your CT is scheduled for 03/08/2022 9:30 am (arrive by 9: 15 am) at [facility].  Nothing to eat or drink 4 hours prior.    Your Barium Swallow is scheduled for 03/08/2022 10 am at [facility]. Nothing to eat or drink 3 hours prior.    If you have any concerns or questions, please feel free to call our office.   Laparoscopic Nissen Fundoplication  Laparoscopic Nissen fundoplication is a surgery to relieve heartburn and other problems caused by fluid from your stomach (gastric fluids) flowing up into your esophagus. The esophagus is the part of the body that moves food from the mouth to the stomach. Normally, the muscle that sits between the stomach and the esophagus (lower esophageal sphincter, LES) keeps stomach fluids in the stomach. In some people, the LES does not work properly, and stomach fluids flow up into the esophagus (reflux). This can happen when part of the stomach bulges through the LES (hiatal hernia). The backward flow of stomach fluids can cause a type of severe and long-lasting heartburn that is called gastroesophageal reflux disease (GERD). You may need this surgery if other treatments for GERD have not helped. In this procedure, the upper part of your stomach is wrapped around the lower end of your esophagus and stitched together (sutured). This tightens the connection between your esophagus and stomach to prevent stomach acid reflux. Tell a health care provider about: Any allergies you have. All medicines you are taking, including vitamins, herbs, eye drops, creams, and over-the-counter medicines. Any problems you or family members have had with anesthetic medicines. Any blood disorders you have. Any surgeries you have had. Any medical conditions you have. Whether you are pregnant or may be pregnant. What are the risks? Generally, this is a safe procedure. However, problems may occur, including: Infection. Bleeding. Damage to other structures or organs. This  can include damage to the lung, causing a collapsed lung. Trouble swallowing (dysphagia). Blood clots. Allergic reactions to medicines. What happens before the procedure? Staying hydrated Follow instructions from your health care provider about hydration, which may include: Up to two hours before the procedure - you may continue to drink clear liquids, such as water, clear fruit juice, black coffee and plain tea. Eating and drinking restrictions Follow instructions from your health care provider about eating and drinking, which may include: 8 hours before the procedure - stop eating heavy meals or foods, such as meat, fried foods, or fatty foods. 6 hours before the procedure - stop eating light meals or foods, such as toast or cereal. 6 hours before the procedure - stop drinking milk or drinks that contain milk. 2 hours before the procedure - stop drinking clear liquids. Medicines Ask your health care provider about: Changing or stopping your regular medicines. This is especially important if you are taking diabetes medicines or blood thinners. Taking medicines such as aspirin and ibuprofen. These medicines can thin your blood. Do not take these medicines unless your health care provider tells you to take them. Taking over-the-counter medicines, vitamins, herbs, and supplements. Tests Your health care provider will do tests to plan the procedure. This may include: An exam using a flexible scope passed down your esophagus into your stomach (endoscopy). Imaging studies. General instructions Plan to have a responsible adult take you home from the hospital or clinic. Ask your health care provider: How your surgery site will be marked. What steps will be taken to help prevent infection. These steps may include: Removing hair  at the surgery site. Washing skin with a germ-killing soap. Taking antibiotic medicine. Do not use any products that contain nicotine or tobacco for at least 4 weeks  before the procedure. These products include cigarettes, chewing tobacco, and vaping devices, such as e-cigarettes. If you need help quitting, ask your health care provider. What happens during the procedure? An IV will be inserted into one of your veins. You will be given medicine in your IV to help you relax (sedative) just before the procedure and a medicine to make you fall asleep (general anesthetic). You may have a tube placed through your nose into your stomach to drain stomach acid during the procedure (nasogastric tube). The surgeon will make a small incision in your abdomen and insert a tube through the incision. Your abdomen will be filled with a gas. This helps the surgeon see your organs better, and it makes more space to work. The surgeon will insert a thin, lighted tube (laparoscope) through the small incision. This allows your surgeon to see into your abdomen. The surgeon will make several other small incisions in your abdomen to insert the other instruments that are needed during the procedure. Another instrument (dilator) will be passed through your mouth and down your esophagus into the upper part of your stomach. The dilator will prevent your LES from being closed too tightly during surgery. The upper part of your stomach will be wrapped around the lower part of your esophagus and will be stitched into place. This will strengthen the lower esophageal sphincter and prevent reflux. If you have a hiatal hernia, it will be repaired. The gas will be released from your abdomen. All instruments will be removed, and the incisions will be closed with stitches (sutures). A bandage (dressing) will be placed on your skin over the incisions. The procedure may vary among health care providers and hospitals. What happens after the procedure? Your blood pressure, heart rate, breathing rate, and blood oxygen level will be monitored until you leave the hospital or clinic. You will be given pain  medicine as needed. Your IV will be kept in until you are able to drink fluids. You will be encouraged to get up and walk around as soon as possible. Summary Laparoscopic Nissen fundoplication is a surgery to relieve heartburn and other problems caused by gastric fluids flowing up into your esophagus. You may need this surgery if other treatments for GERD have not helped. Follow instructions from your health care provider about eating and drinking before the procedure. Your surgeon will use a thin, lighted tube (laparoscope) that is inserted through a small incision, allowing the surgeon to see into your abdomen. This information is not intended to replace advice given to you by your health care provider. Make sure you discuss any questions you have with your health care provider. Document Revised: 09/13/2019 Document Reviewed: 09/13/2019 Elsevier Patient Education  2023 [employer].

## 2022-02-27 NOTE — Progress Notes (Signed)
Patient ID: Tara [name], female   DOB: 1965/12/07, 56 y.o.   MRN: [id number]  HPI Tara [name] is a 56 y.o. female The patient is a 56 y.o. female seen in consultation at the request of Dr. Allegra Lai for assistance in the management of GERD.  The patient's reports havign GERD for over a decade and is only partially controlled w PPI and famotidine, No dysphagia, some chest pain and heartburn that is exacerbated by acidic meals and stopping the meds.. Extra-esophageal symptoms are present and include nausea, chest pain and cough. She was seen at [facility] before the pandemic as was going to have antireflux procedure but things fell through. She had a recent EGD that I have personally reviewed showing evidence of Barrett's esophagus and esophagitis.  No evidence of cancer on final pathology but evidence of Barrett's esophagus and esophagitis.  She also had a prior gastric emptying study at [facility] showing normal emptying of the stomach.  She is able to perform more than 4 METS of activity without any shortness of breath or chest pain. She does have a history of abdominal hysterectomy also lower abdominal approach for a lumbar fusion.  Her CBC and CMP is normal She did have a CT of the chest that I have personally reviewed showing no evidence of mediastinal issues and no obvious evidence of hiatal hernias. HPI  Past Medical History:  Diagnosis Date   Anxiety    Arthritis    arthritis in back   Barrett's esophagus    COPD (chronic obstructive pulmonary disease) (HCC)    uses spiriva   CPRS 1 (complex regional pain syndrome I) of upper limb 2007   formerly called rsd   Depression    being treated for depression   GERD (gastroesophageal reflux disease)    H/O degenerative disc disease    chronic back pain   Headache(784.0)    headaches due to pain   Neuromuscular disorder (HCC)    rsd   Pneumonia    2011 - hospitalized for 4 days   Pneumonia    Reflex sympathetic dystrophy    Tobacco abuse      Past Surgical History:  Procedure Laterality Date   ABDOMINAL EXPOSURE N/A 07/27/2021   Procedure: ABDOMINAL EXPOSURE FOR ANTERIOR SPINE SURGERY;  Surgeon: Leonie Douglas, MD;  Location: [facility] OR;  Service: Vascular;  Laterality: N/A;   ABDOMINAL HYSTERECTOMY  2005   partial   ANTERIOR AND POSTERIOR SPINAL FUSION N/A 07/27/2021   Procedure: ANTERIOR LUMBAR INTERBODY FUSION AND POSTERIOR SPINAL FUSION WITH PEDICLE SCREWS LUMBAR FIVE TO SACRAL ONE;  Surgeon: Venita Lick, MD;  Location: [facility] OR;  Service: Orthopedics;  Laterality: N/A;  4.5 hrs Dr. Lenell Antu to do approach left tap block with exparel 3 C-Bed   CERVICAL DISC ARTHROPLASTY  2008   ESOPHAGOGASTRODUODENOSCOPY (EGD) WITH PROPOFOL N/A 06/04/2017   Procedure: ESOPHAGOGASTRODUODENOSCOPY (EGD) WITH PROPOFOL;  Surgeon: Christena Deem, MD;  Location: [facility] ENDOSCOPY;  Service: Endoscopy;  Laterality: N/A;   ESOPHAGOGASTRODUODENOSCOPY (EGD) WITH PROPOFOL N/A 12/21/2021   Procedure: ESOPHAGOGASTRODUODENOSCOPY (EGD) WITH PROPOFOL;  Surgeon: Toney Reil, MD;  Location: [facility] ENDOSCOPY;  Service: Gastroenterology;  Laterality: N/A;   KNEE ARTHROSCOPY Right 2008   LUMBAR LAMINECTOMY/DECOMPRESSION MICRODISCECTOMY  01/25/2011   Procedure: LUMBAR LAMINECTOMY/DECOMPRESSION MICRODISCECTOMY;  Surgeon: Alvy Beal;  Location: [facility] OR;  Service: Orthopedics;  Laterality: Left;  LEFT L5-S1 MICRODISCECTOMY, Central Decompression Lumbar five-sacral one   OOPHORECTOMY Left 2011   OOPHORECTOMY Right 10/2012   SPINAL CORD  STIMULATOR IMPLANT  2009   Removal-01/2012   SPINAL CORD STIMULATOR REMOVAL  01/17/2012   Procedure: LUMBAR SPINAL CORD STIMULATOR REMOVAL;  Surgeon: Venita Lick, MD;  Location: [facility] OR;  Service: Orthopedics;  Laterality: N/A;  Spinal Cord Battery Removal   TUBAL LIGATION  1988    Family History  Problem Relation Age of Onset   Depression Mother    Ovarian cancer Mother    Hypertension Mother    Hypercholesterolemia  Mother    Obesity Mother    Hypertension Sister    Obesity Sister    Stroke Sister    Healthy Sister    Healthy Daughter    Tongue cancer Daughter    Healthy Daughter    Breast cancer Neg Hx     Social History Social History   Tobacco Use   Smoking status: Former    Packs/day: 1.00    Years: 30.00    Total pack years: 30.00    Types: Cigarettes    Start date: 09/10/1986    Quit date: 2018    Years since quitting: 5.9   Smokeless tobacco: Never  Vaping Use   Vaping Use: Every day   Start date: 12/12/2016   Substances: Nicotine  Substance Use Topics   Alcohol use: No    Alcohol/week: 0.0 standard drinks of alcohol   Drug use: No    Allergies  Allergen Reactions   Aspirin Other (See Comments)    Causes heartburn even the chewable kind   Clonidine Derivatives Itching    Current Outpatient Medications  Medication Sig Dispense Refill   Calcium 600-200 MG-UNIT tablet Take 1 tablet by mouth 2 (two) times daily.     esomeprazole (NEXIUM) 40 MG capsule Take 1 capsule (40 mg total) by mouth 2 (two) times daily. 180 capsule 1   eszopiclone 3 MG TABS Take 1 tablet (3 mg total) by mouth at bedtime. Take immediately before bedtime 90 tablet 1   famotidine (PEPCID) 20 MG tablet Take 1 tablet (20 mg total) by mouth 2 (two) times daily. 180 tablet 1   Fluticasone-Umeclidin-Vilant (TRELEGY ELLIPTA) 100-62.5-25 MCG/ACT AEPB Inhale 1 puff into the lungs daily. 1 each 5   rosuvastatin (CRESTOR) 10 MG tablet Take 1 tablet (10 mg total) by mouth daily. 90 tablet 3   venlafaxine XR (EFFEXOR-XR) 150 MG 24 hr capsule TAKE 1 CAPSULE(150 MG) BY MOUTH DAILY WITH BREAKFAST 90 capsule 1   No current facility-administered medications for this visit.     Review of Systems Full ROS  was asked and was negative except for the information on the HPI  Physical Exam Blood pressure 117/77, pulse 83, temperature 98.3 F (36.8 C), temperature source Oral, height 5\' 8"  (1.727 m), weight 167 lb 3.2 oz  (75.8 kg), SpO2 98 %. CONSTITUTIONAL: NAD EYES: Pupils are equal, round,  Sclera are non-icteric. EARS, NOSE, MOUTH AND THROAT: The oropharynx is clear. The oral mucosa is pink and moist. Hearing is intact to voice. LYMPH NODES:  Lymph nodes in the neck are normal. RESPIRATORY:  Lungs are clear. There is normal respiratory effort, with equal breath sounds bilaterally, and without pathologic use of accessory muscles. CARDIOVASCULAR: Heart is regular without murmurs, gallops, or rubs. GI: The abdomen is  soft, nontender, and nondistended. There are no palpable masses. There is no hepatosplenomegaly. There are normal bowel sounds in all quadrants. Left inguinal scar GU: Rectal deferred.   MUSCULOSKELETAL: Normal muscle strength and tone. No cyanosis or edema.   SKIN: Turgor is good  and there are no pathologic skin lesions or ulcers. NEUROLOGIC: Motor and sensation is grossly normal. Cranial nerves are grossly intact. PSYCH:  Oriented to person, place and time. Affect is normal.  Data Reviewed  I have personally reviewed the patient's imaging, laboratory findings and medical records.    Assessment/Plan 56 year old female with recalcitrant reflux.  There is no contraindication for antireflux procedure and patient wishes to be off the current medications for reflux.  She also wishes to stop acid as she has read that there is progression of Barrett's.  I discussed with her in detail about my thought process and I definitely agree that antireflux procedure will be able to get her off the PPIs and hold the progression of Barrett's.  We will obtain a barium swallow as well as a CT scan of the abdomen pelvis for preoperative planning.  We will obtain a barium swallow as well as a CT scan of the abdomen pelvis for preoperative planning.  She has no dysphagia which I think that and the simplification robotically will be indicated.  Please note that I spent greater than 60 minutes in this encounter including  extensive review of medical records, personally reviewing imaging studies, counseling the patient and placing orders and performing appropriate documentation  Sterling Big, MD FACS General Surgeon 02/27/2022, 12:04 PM

## 2022-03-08 ENCOUNTER — Ambulatory Visit
Admission: RE | Admit: 2022-03-08 | Discharge: 2022-03-08 | Disposition: A | Discharge status: Home / Self Care | Payer: Medicare PPO | Source: Ambulatory Visit | Attending: Surgery | Admitting: Surgery

## 2022-03-08 DIAGNOSIS — I700 Atherosclerosis of aorta: Secondary | ICD-10-CM | POA: Diagnosis not present

## 2022-03-08 DIAGNOSIS — K5730 Diverticulosis of large intestine without perforation or abscess without bleeding: Secondary | ICD-10-CM | POA: Diagnosis not present

## 2022-03-08 DIAGNOSIS — K219 Gastro-esophageal reflux disease without esophagitis: Secondary | ICD-10-CM

## 2022-03-08 DIAGNOSIS — K449 Diaphragmatic hernia without obstruction or gangrene: Secondary | ICD-10-CM | POA: Diagnosis not present

## 2022-03-08 MED ORDER — IOHEXOL 300 MG/ML  SOLN
100.0000 mL | Freq: Once | INTRAMUSCULAR | Status: AC | PRN
  Administered 2022-03-08: 100 mL via INTRAVENOUS

## 2022-03-17 ENCOUNTER — Other Ambulatory Visit: Payer: Self-pay | Admitting: Family Medicine

## 2022-03-17 DIAGNOSIS — K219 Gastro-esophageal reflux disease without esophagitis: Secondary | ICD-10-CM

## 2022-03-19 ENCOUNTER — Ambulatory Visit: Payer: Medicare PPO | Admitting: Surgery

## 2022-03-19 ENCOUNTER — Encounter: Payer: Self-pay | Admitting: Surgery

## 2022-03-19 VITALS — BP 115/81 | HR 91 | Temp 98.5°F | Ht 68.0 in | Wt 166.6 lb

## 2022-03-19 DIAGNOSIS — K219 Gastro-esophageal reflux disease without esophagitis: Secondary | ICD-10-CM

## 2022-03-19 NOTE — Patient Instructions (Signed)
Our surgery scheduler will call you within 24-48 hours to schedule your surgery. Please have the Blue surgery sheet available when speaking with her.Hiatal Hernia  A hiatal hernia occurs when part of the stomach slides above the muscle that separates the abdomen from the chest (diaphragm). A person can be born with a hiatal hernia (congenital), or it may develop over time. In almost all cases of hiatal hernia, only the top part of the stomach pushes through the diaphragm. Many people have a hiatal hernia with no symptoms. The larger the hernia, the more likely it is that you will have symptoms. In some cases, a hiatal hernia allows stomach acid to flow back into the tube that carries food from your mouth to your stomach (esophagus). This may cause heartburn symptoms. The development of heartburn symptoms may mean that you have a condition called gastroesophageal reflux disease (GERD). What are the causes? This condition is caused by a weakness in the opening (hiatus) where the esophagus passes through the diaphragm to attach to the upper part of the stomach. A person may be born with a weakness in the hiatus, or a weakness can develop over time. What increases the risk? This condition is more likely to develop in: Older people. Age is a major risk factor for a hiatal hernia, especially if you are over the age of 67. Pregnant women. People who are overweight. People who have frequent constipation. What are the signs or symptoms? Symptoms of this condition usually develop in the form of GERD symptoms. Symptoms include: Heartburn. Upset stomach (indigestion). Trouble swallowing. Coughing or wheezing. Wheezing is making high-pitched whistling sounds when you breathe. Sore throat. Chest pain. Nausea and vomiting. How is this diagnosed? This condition may be diagnosed during testing for GERD. Tests that may be done include: X-rays of your stomach or chest. An upper gastrointestinal (GI) series.  This is an X-ray exam of your GI tract that is taken after you swallow a chalky liquid that shows up clearly on the X-ray. Endoscopy. This is a procedure to look into your stomach using a thin, flexible tube that has a tiny camera and light on the end of it. How is this treated? This condition may be treated by: Dietary and lifestyle changes to help reduce GERD symptoms. Medicines. These may include: Over-the-counter antacids. Medicines that make your stomach empty more quickly. Medicines that block the production of stomach acid (H2 blockers). Stronger medicines to reduce stomach acid (proton pump inhibitors). Surgery to repair the hernia, if other treatments are not helping. If you have no symptoms, you may not need treatment. Follow these instructions at home: Lifestyle and activity Do not use any products that contain nicotine or tobacco. These products include cigarettes, chewing tobacco, and vaping devices, such as e-cigarettes. If you need help quitting, ask your health care provider. Try to achieve and maintain a healthy body weight. Avoid putting pressure on your abdomen. Anything that puts pressure on your abdomen increases the amount of acid that may be pushed up into your esophagus. Avoid bending over, especially after eating. Raise the head of your bed by putting blocks under the legs. This keeps your head and esophagus higher than your stomach. Do not wear tight clothing around your chest or stomach. Try not to strain when having a bowel movement, when urinating, or when lifting heavy objects. Eating and drinking Avoid foods that can worsen GERD symptoms. These may include: Fatty foods, like fried foods. Citrus fruits, like oranges or lemon. Other foods  and drinks that contain acid, like orange juice or tomatoes. Spicy food. Chocolate. Eat frequent small meals instead of three large meals a day. This helps prevent your stomach from getting too full. Eat slowly. Do not lie  down right after eating. Do not eat 1-2 hours before bed. Do not drink beverages with caffeine. These include cola, coffee, cocoa, and tea. Do not drink alcohol. General instructions Take over-the-counter and prescription medicines only as told by your health care provider. Keep all follow-up visits. Your health care provider will want to check that any new prescribed medicines are helping your symptoms. Contact a health care provider if: Your symptoms are not controlled with medicines or lifestyle changes. You are having trouble swallowing. You have coughing or wheezing that will not go away. Your pain is getting worse. Your pain spreads to your arms, neck, jaw, teeth, or back. You feel nauseous or you vomit. Get help right away if: You have shortness of breath. You vomit blood. You have bright red blood in your stools. You have black, tarry stools. These symptoms may be an emergency. Get help right away. Call 911. Do not wait to see if the symptoms will go away. Do not drive yourself to the hospital. Summary A hiatal hernia occurs when part of the stomach slides above the muscle that separates the abdomen from the chest. A person may be born with a weakness in the hiatus, or a weakness can develop over time. Symptoms of a hiatal hernia may include heartburn, trouble swallowing, or sore throat. Management of a hiatal hernia includes eating frequent small meals instead of three large meals a day. Get help right away if you vomit blood, have bright red blood in your stools, or have black, tarry stools. This information is not intended to replace advice given to you by your health care provider. Make sure you discuss any questions you have with your health care provider. Document Revised: 04/25/2021 Document Reviewed: 04/25/2021 Elsevier Patient Education  [place] After Nissen Fundoplication After a Nissen fundoplication procedure, it is common to  have some difficulty swallowing. The part of your body that moves food and liquid from your mouth to your stomach (esophagus) will be swollen and may feel tight. It will take several weeks or months for your esophagus and stomach to heal. By following a special eating plan, you can prevent problems such as pain, swelling or pressure in the abdomen (bloating), gas, nausea, or diarrhea. What are tips for following this plan? Cooking Cook all foods until they are soft. Remove skins and seeds from fruits and vegetables before eating. Remove skin and gristle from meats. Grind or finely mince meats before eating. Avoid over-cooking meat. Dry, tough meat is more difficult to swallow. Avoid using oil when cooking, or use only a small amount of oil. Avoid using seasoning when cooking, or use only a small amount of seasoning. Toast bread before eating. This makes it easier to swallow. Meal planning  Eat 6-8 small meals throughout the day. Right after the surgery, have a few meals that are only clear liquids. Clear liquids include: Water. Clear fruit juice, no pulp. Chicken, beef, or vegetable broth. Gelatin. Decaffeinated tea or coffee without milk. Popsicles or shaved ice. Depending on your progress, you may move to a full liquid diet as told by your health care provider. This includes clear liquids and the following: Dairy and alternative milks, such as soy milk. Strained creamed soups.  Ice cream or sherbet. Pudding. Nutritional supplement drinks. Yogurt. A few days after surgery, you may be able to start eating a diet of soft foods. You may need to eat according to this plan for several weeks. Do not eat sweets or sweetened drinks at the beginning of a meal. Doing that may cause your stomach to empty faster than it should (dumping syndrome). Lifestyle Always sit upright when eating or drinking. Eat slowly. Take small bites and chew food well before swallowing. Do not lie down after eating.  Stay sitting up for 30 minutes or longer after each meal. Sip fluids between meals. Limit how much you drink at one time. With meals and snacks, have 4-8 oz (120-240 mL). This is equal to  cup-1 cup. Do not mix solid foods and liquids in the same mouthful. Drink enough fluid to keep your urine pale yellow. Do not chew gum or drink fluids through a straw. Doing those things may cause you to swallow extra air. General information Do not drink carbonated drinks or alcohol. Avoid foods and drinks that contain caffeine and chocolate. Avoid foods and drinks that contain citrus or tomato. Allow hot soups and drinks to cool before eating. Avoid foods that cause gas, such as beans, peas, broccoli, or cabbage. If dairy milk products cause diarrhea, avoid them or eat them in small amounts. Recommended foods Fruits Any soft-cooked fruits after skins and seeds are removed. Fruit juice. Vegetables Any soft-cooked vegetables after skins and seeds are removed. Vegetable juice. Grains Cooked cereals. Dry cereals softened with liquid. Cooked pasta, rice, or other grains. Toasted bread. Bland crackers, such as soda or graham crackers. Meats and other protein foods Tender cuts of meat, poultry, or fish after bones, skin, and gristle are removed. Poached, boiled, or scrambled eggs. Canned fish. Tofu. Creamy nut butters. Dairy Milk. Yogurt. Cottage cheese. Mild cheeses. Beverages Nutritional supplement drinks. Decaffeinated tea or coffee. Sports drinks. Fats and oils Butter. Margarine. Mayonnaise. Vegetable oil. Smooth salad dressing. Sweets and desserts Plain hard candy. Marshmallows. Pudding. Ice cream. Gelatin. Sherbet. Seasoning and other foods Salt. Light seasonings. Mustard. Vinegar. The items listed above may not be a complete list of recommended foods and beverages. Contact a dietitian for more information. Foods to avoid Fruits Oranges. Grapefruit. Lemons. Limes. Citrus juices. Dried fruit.  Crunchy, raw fruits. Vegetables Tomato sauce. Tomato juice. Broccoli. Cauliflower. Cabbage. Brussels sprouts. Crunchy, raw vegetables. Grains High-fiber or bran cereal. Cereal with nuts, dried fruit, or coconut. Sweet breads, rolls, coffee cake, or donuts. Chewy or crusty breads. Popcorn. Meats and other protein foods Beans, peas, and lentils. Tough or fatty meats. Fried meats, chicken, or fish. Fried eggs. Nuts and seeds. Crunchy nut butters. Dairy Chocolate milk. Yogurt with chunks of fruit, nuts, seeds, or coconut. Strong cheeses. Beverages Carbonated soft drinks. Alcohol. Cocoa. Hot drinks. Fats and oils Bacon fat. Lard. Sweets and desserts Chocolate. Candy with nuts, coconut, or seeds. Peppermint. Cookies. Cakes. Pie crust. Seasoning and other foods Heavy seasonings. Chili sauce. Ketchup. Barbecue sauce. Rosita Fire. Horseradish. The items listed above may not be a complete list of foods and beverages to avoid. Contact a dietitian for more information. Summary Following this eating plan after a Nissen fundoplication is an important part of healing after surgery. After surgery, you will start with a clear liquid diet before you progress to full liquids and soft foods. You may need to eat soft foods for several weeks. Avoid eating foods that cause irritation, gas, nausea, diarrhea, or swelling or pressure in the  abdomen (bloating), and avoid foods that are difficult to swallow. Talk with a dietitian about which dietary choices are best for you. This information is not intended to replace advice given to you by your health care provider. Make sure you discuss any questions you have with your health care provider. Document Revised: 09/13/2019 Document Reviewed: 09/13/2019 Elsevier Patient Education  2023 [employer] Laparoscopic Nissen Fundoplication  Laparoscopic Nissen fundoplication is a surgery to relieve heartburn and other problems caused by fluid from your stomach (gastric fluids)  flowing up into your esophagus. The esophagus is the part of the body that moves food from the mouth to the stomach. Normally, the muscle that sits between the stomach and the esophagus (lower esophageal sphincter, LES) keeps stomach fluids in the stomach. In some people, the LES does not work properly, and stomach fluids flow up into the esophagus (reflux). This can happen when part of the stomach bulges through the LES (hiatal hernia). The backward flow of stomach fluids can cause a type of severe and long-lasting heartburn that is called gastroesophageal reflux disease (GERD). You may need this surgery if other treatments for GERD have not helped. In this procedure, the upper part of your stomach is wrapped around the lower end of your esophagus and stitched together (sutured). This tightens the connection between your esophagus and stomach to prevent stomach acid reflux. Tell a health care provider about: Any allergies you have. All medicines you are taking, including vitamins, herbs, eye drops, creams, and over-the-counter medicines. Any problems you or family members have had with anesthetic medicines. Any blood disorders you have. Any surgeries you have had. Any medical conditions you have. Whether you are pregnant or may be pregnant. What are the risks? Generally, this is a safe procedure. However, problems may occur, including: Infection. Bleeding. Damage to other structures or organs. This can include damage to the lung, causing a collapsed lung. Trouble swallowing (dysphagia). Blood clots. Allergic reactions to medicines. What happens before the procedure? Staying hydrated Follow instructions from your health care provider about hydration, which may include: Up to two hours before the procedure - you may continue to drink clear liquids, such as water, clear fruit juice, black coffee and plain tea. Eating and drinking restrictions Follow instructions from your health care provider  about eating and drinking, which may include: 8 hours before the procedure - stop eating heavy meals or foods, such as meat, fried foods, or fatty foods. 6 hours before the procedure - stop eating light meals or foods, such as toast or cereal. 6 hours before the procedure - stop drinking milk or drinks that contain milk. 2 hours before the procedure - stop drinking clear liquids. Medicines Ask your health care provider about: Changing or stopping your regular medicines. This is especially important if you are taking diabetes medicines or blood thinners. Taking medicines such as aspirin and ibuprofen. These medicines can thin your blood. Do not take these medicines unless your health care provider tells you to take them. Taking over-the-counter medicines, vitamins, herbs, and supplements. Tests Your health care provider will do tests to plan the procedure. This may include: An exam using a flexible scope passed down your esophagus into your stomach (endoscopy). Imaging studies. General instructions Plan to have a responsible adult take you home from the hospital or clinic. Ask your health care provider: How your surgery site will be marked. What steps will be taken to help prevent infection. These steps may include: Removing hair at the surgery site.  Washing skin with a germ-killing soap. Taking antibiotic medicine. Do not use any products that contain nicotine or tobacco for at least 4 weeks before the procedure. These products include cigarettes, chewing tobacco, and vaping devices, such as e-cigarettes. If you need help quitting, ask your health care provider. What happens during the procedure? An IV will be inserted into one of your veins. You will be given medicine in your IV to help you relax (sedative) just before the procedure and a medicine to make you fall asleep (general anesthetic). You may have a tube placed through your nose into your stomach to drain stomach acid during the  procedure (nasogastric tube). The surgeon will make a small incision in your abdomen and insert a tube through the incision. Your abdomen will be filled with a gas. This helps the surgeon see your organs better, and it makes more space to work. The surgeon will insert a thin, lighted tube (laparoscope) through the small incision. This allows your surgeon to see into your abdomen. The surgeon will make several other small incisions in your abdomen to insert the other instruments that are needed during the procedure. Another instrument (dilator) will be passed through your mouth and down your esophagus into the upper part of your stomach. The dilator will prevent your LES from being closed too tightly during surgery. The upper part of your stomach will be wrapped around the lower part of your esophagus and will be stitched into place. This will strengthen the lower esophageal sphincter and prevent reflux. If you have a hiatal hernia, it will be repaired. The gas will be released from your abdomen. All instruments will be removed, and the incisions will be closed with stitches (sutures). A bandage (dressing) will be placed on your skin over the incisions. The procedure may vary among health care providers and hospitals. What happens after the procedure? Your blood pressure, heart rate, breathing rate, and blood oxygen level will be monitored until you leave the hospital or clinic. You will be given pain medicine as needed. Your IV will be kept in until you are able to drink fluids. You will be encouraged to get up and walk around as soon as possible. Summary Laparoscopic Nissen fundoplication is a surgery to relieve heartburn and other problems caused by gastric fluids flowing up into your esophagus. You may need this surgery if other treatments for GERD have not helped. Follow instructions from your health care provider about eating and drinking before the procedure. Your surgeon will use a thin,  lighted tube (laparoscope) that is inserted through a small incision, allowing the surgeon to see into your abdomen. This information is not intended to replace advice given to you by your health care provider. Make sure you discuss any questions you have with your health care provider. Document Revised: 09/13/2019 Document Reviewed: 09/13/2019 Elsevier Patient Education  2023 [employer].

## 2022-03-21 ENCOUNTER — Telehealth: Payer: Self-pay | Admitting: Surgery

## 2022-03-21 ENCOUNTER — Encounter
Admission: RE | Admit: 2022-03-21 | Discharge: 2022-03-21 | Disposition: A | Discharge status: Home / Self Care | Payer: Medicare PPO | Source: Ambulatory Visit | Attending: Surgery | Admitting: Surgery

## 2022-03-21 DIAGNOSIS — Z01812 Encounter for preprocedural laboratory examination: Secondary | ICD-10-CM

## 2022-03-21 DIAGNOSIS — J449 Chronic obstructive pulmonary disease, unspecified: Secondary | ICD-10-CM

## 2022-03-21 DIAGNOSIS — K760 Fatty (change of) liver, not elsewhere classified: Secondary | ICD-10-CM

## 2022-03-21 HISTORY — DX: Fatty (change of) liver, not elsewhere classified: K76.0

## 2022-03-21 NOTE — Progress Notes (Unsigned)
Outpatient Surgical Follow Up  03/21/2022  Tara [name] is an 57 y.o. female.   Chief Complaint  Patient presents with   Follow-up    Antireflux surgery    HPI: Tara [name] is a 57 y.o. female The patient is a 57 y.o. female seen i for assistance in the management of GERD.  The patient's reports having GERD for over a decade and is only partially controlled w PPI and famotidine, No dysphagia, some chest pain and heartburn that is exacerbated by acidic meals and stopping the meds.. Extra-esophageal symptoms are present and include nausea, chest pain and cough. She was seen at [facility] before the pandemic as was going to have antireflux procedure but things fell through. She had a recent EGD that I have personally reviewed showing evidence of Barrett's esophagus and esophagitis.  No evidence of cancer on final pathology but evidence of Barrett's esophagus and esophagitis.  She also had a prior gastric emptying study at [facility] showing normal emptying of the stomach.  She is able to perform more than 4 METS of activity without any shortness of breath or chest pain. She does have a history of abdominal hysterectomy also lower abdominal approach for a lumbar fusion.  Her CBC and CMP is normal She did have a CT of the chest/A  and swallow that I have personally reviewed showing small sliding hiatal hernia w reflux  HPI  Past Medical History:  Diagnosis Date   Anxiety    Arthritis    arthritis in back   Barrett's esophagus    COPD (chronic obstructive pulmonary disease) ([place])    CPRS 1 (complex regional pain syndrome I) of upper limb 2007   formerly called rsd   Depression    being treated for depression   Fatty liver    GERD (gastroesophageal reflux disease)    H/O degenerative disc disease    chronic back pain   Headache(784.0)    headaches due to pain   Neuromuscular disorder ([place])    rsd   Pneumonia    2011 - hospitalized for 4 days   Reflex sympathetic dystrophy    Tobacco  abuse     Past Surgical History:  Procedure Laterality Date   ABDOMINAL EXPOSURE N/A 07/27/2021   Procedure: ABDOMINAL EXPOSURE FOR ANTERIOR SPINE SURGERY;  Surgeon: Cherre Robins, MD;  Location: [facility] OR;  Service: Vascular;  Laterality: N/A;   ABDOMINAL HYSTERECTOMY  2005   partial   ANTERIOR AND POSTERIOR SPINAL FUSION N/A 07/27/2021   Procedure: ANTERIOR LUMBAR INTERBODY FUSION AND POSTERIOR SPINAL FUSION WITH PEDICLE SCREWS LUMBAR FIVE TO SACRAL ONE;  Surgeon: Melina Schools, MD;  Location: [place];  Service: Orthopedics;  Laterality: N/A;  4.5 hrs Dr. Stanford Breed to do approach left tap block with exparel 3 C-Bed   CERVICAL DISC ARTHROPLASTY  2008   ESOPHAGOGASTRODUODENOSCOPY (EGD) WITH PROPOFOL N/A 06/04/2017   Procedure: ESOPHAGOGASTRODUODENOSCOPY (EGD) WITH PROPOFOL;  Surgeon: Lollie Sails, MD;  Location: [facility] ENDOSCOPY;  Service: Endoscopy;  Laterality: N/A;   ESOPHAGOGASTRODUODENOSCOPY (EGD) WITH PROPOFOL N/A 12/21/2021   Procedure: ESOPHAGOGASTRODUODENOSCOPY (EGD) WITH PROPOFOL;  Surgeon: Lin Landsman, MD;  Location: [facility] ENDOSCOPY;  Service: Gastroenterology;  Laterality: N/A;   KNEE ARTHROSCOPY Right 2008   LUMBAR LAMINECTOMY/DECOMPRESSION MICRODISCECTOMY  01/25/2011   Procedure: LUMBAR LAMINECTOMY/DECOMPRESSION MICRODISCECTOMY;  Surgeon: Dahlia Bailiff;  Location: [place];  Service: Orthopedics;  Laterality: Left;  LEFT L5-S1 MICRODISCECTOMY, Central Decompression Lumbar five-sacral one   OOPHORECTOMY Left 2011   OOPHORECTOMY Right  10/2012   SPINAL CORD STIMULATOR IMPLANT  2009   Removal-01/2012   SPINAL CORD STIMULATOR REMOVAL  01/17/2012   Procedure: LUMBAR SPINAL CORD STIMULATOR REMOVAL;  Surgeon: Dahari Brooks, MD;  Location: [facility] OR;  Service: Orthopedics;  Laterality: N/A;  Spinal Cord Battery Removal   TUBAL LIGATION  1988    Family History  Problem Relation Age of Onset   Depression Mother    Ovarian cancer Mother    Hypertension Mother     Hypercholesterolemia Mother    Obesity Mother    Hypertension Sister    Obesity Sister    Stroke Sister    Healthy Sister    Healthy Daughter    Tongue cancer Daughter    Healthy Daughter    Breast cancer Neg Hx     Social History:  reports that she quit smoking about 6 years ago. Her smoking use included cigarettes. She started smoking about 35 years ago. She has a 30.00 pack-year smoking history. She has never used smokeless tobacco. She reports that she does not drink alcohol and does not use drugs.  Allergies:  Allergies  Allergen Reactions   Aspirin Other (See Comments)    Causes heartburn even the chewable kind   Clonidine Derivatives Itching    Medications reviewed.    ROS Full ROS performed and is otherwise negative other than what is stated in HPI   BP 115/81   Pulse 91   Temp 98.5 F (36.9 C) (Oral)   Ht 5' 8" (1.727 m)   Wt 166 lb 9.6 oz (75.6 kg)   SpO2 97%   BMI 25.33 kg/m   Physical Exam CONSTITUTIONAL: NAD EYES: Pupils are equal, round,  Sclera are non-icteric. EARS, NOSE, MOUTH AND THROAT: The oropharynx is clear. The oral mucosa is pink and moist. Hearing is intact to voice. LYMPH NODES:  Lymph nodes in the neck are normal. RESPIRATORY:  Lungs are clear. There is normal respiratory effort, with equal breath sounds bilaterally, and without pathologic use of accessory muscles. CARDIOVASCULAR: Heart is regular without murmurs, gallops, or rubs. GI: The abdomen is  soft, nontender, and nondistended. There are no palpable masses. There is no hepatosplenomegaly. There are normal bowel sounds in all quadrants. Left inguinal scar GU: Rectal deferred.   MUSCULOSKELETAL: Normal muscle strength and tone. No cyanosis or edema.   SKIN: Turgor is good and there are no pathologic skin lesions or ulcers. NEUROLOGIC: Motor and sensation is grossly normal. Cranial nerves are grossly intact. PSYCH:  Oriented to person, place and time. Affect is  normal  Assessment/Plan: 56-year-old female with recalcitrant reflux. There is no contraindication for antireflux procedure and patient wishes to be off the current medications for reflux. She also wishes to stop acid as she has read that there is progression of Barrett's. I discussed with her in detail about my thought process and I definitely agree that antireflux procedure will be able to get her off the PPIs and hold the progression of Barrett's.  She has no dysphagia and fundoplication Nissen robotically will be indicated.  Procedure discussed with the patient in detail.  Risks, benefits and possible implications including but not limited to, bleeding, infection, bowel injuries and esophageal injuries, dysphagia, bloating.  She agrees and wishes to proceed Please note that I spent greater than 40 minutes in this encounter including extensive review of medical records, personally reviewing imaging studies, counseling the patient and placing orders and performing appropriate documentation   Savanah Bayles, MD FACS General Surgeon 

## 2022-03-21 NOTE — H&P (View-Only) (Signed)
Outpatient Surgical Follow Up  03/21/2022  Tara [name] is an 57 y.o. female.   Chief Complaint  Patient presents with   Follow-up    Antireflux surgery    HPI: Tara [name] is a 57 y.o. female The patient is a 57 y.o. female seen i for assistance in the management of GERD.  The patient's reports having GERD for over a decade and is only partially controlled w PPI and famotidine, No dysphagia, some chest pain and heartburn that is exacerbated by acidic meals and stopping the meds.. Extra-esophageal symptoms are present and include nausea, chest pain and cough. She was seen at [facility] before the pandemic as was going to have antireflux procedure but things fell through. She had a recent EGD that I have personally reviewed showing evidence of Barrett's esophagus and esophagitis.  No evidence of cancer on final pathology but evidence of Barrett's esophagus and esophagitis.  She also had a prior gastric emptying study at [facility] showing normal emptying of the stomach.  She is able to perform more than 4 METS of activity without any shortness of breath or chest pain. She does have a history of abdominal hysterectomy also lower abdominal approach for a lumbar fusion.  Her CBC and CMP is normal She did have a CT of the chest/A  and swallow that I have personally reviewed showing small sliding hiatal hernia w reflux  HPI  Past Medical History:  Diagnosis Date   Anxiety    Arthritis    arthritis in back   Barrett's esophagus    COPD (chronic obstructive pulmonary disease) ([place])    CPRS 1 (complex regional pain syndrome I) of upper limb 2007   formerly called rsd   Depression    being treated for depression   Fatty liver    GERD (gastroesophageal reflux disease)    H/O degenerative disc disease    chronic back pain   Headache(784.0)    headaches due to pain   Neuromuscular disorder ([place])    rsd   Pneumonia    2011 - hospitalized for 4 days   Reflex sympathetic dystrophy    Tobacco  abuse     Past Surgical History:  Procedure Laterality Date   ABDOMINAL EXPOSURE N/A 07/27/2021   Procedure: ABDOMINAL EXPOSURE FOR ANTERIOR SPINE SURGERY;  Surgeon: Cherre Robins, MD;  Location: [facility] OR;  Service: Vascular;  Laterality: N/A;   ABDOMINAL HYSTERECTOMY  2005   partial   ANTERIOR AND POSTERIOR SPINAL FUSION N/A 07/27/2021   Procedure: ANTERIOR LUMBAR INTERBODY FUSION AND POSTERIOR SPINAL FUSION WITH PEDICLE SCREWS LUMBAR FIVE TO SACRAL ONE;  Surgeon: Melina Schools, MD;  Location: [place];  Service: Orthopedics;  Laterality: N/A;  4.5 hrs Dr. Stanford Breed to do approach left tap block with exparel 3 C-Bed   CERVICAL DISC ARTHROPLASTY  2008   ESOPHAGOGASTRODUODENOSCOPY (EGD) WITH PROPOFOL N/A 06/04/2017   Procedure: ESOPHAGOGASTRODUODENOSCOPY (EGD) WITH PROPOFOL;  Surgeon: Lollie Sails, MD;  Location: [facility] ENDOSCOPY;  Service: Endoscopy;  Laterality: N/A;   ESOPHAGOGASTRODUODENOSCOPY (EGD) WITH PROPOFOL N/A 12/21/2021   Procedure: ESOPHAGOGASTRODUODENOSCOPY (EGD) WITH PROPOFOL;  Surgeon: Lin Landsman, MD;  Location: [facility] ENDOSCOPY;  Service: Gastroenterology;  Laterality: N/A;   KNEE ARTHROSCOPY Right 2008   LUMBAR LAMINECTOMY/DECOMPRESSION MICRODISCECTOMY  01/25/2011   Procedure: LUMBAR LAMINECTOMY/DECOMPRESSION MICRODISCECTOMY;  Surgeon: Dahlia Bailiff;  Location: [place];  Service: Orthopedics;  Laterality: Left;  LEFT L5-S1 MICRODISCECTOMY, Central Decompression Lumbar five-sacral one   OOPHORECTOMY Left 2011   OOPHORECTOMY Right  10/2012   SPINAL CORD STIMULATOR IMPLANT  2009   Removal-01/2012   SPINAL CORD STIMULATOR REMOVAL  01/17/2012   Procedure: LUMBAR SPINAL CORD STIMULATOR REMOVAL;  Surgeon: Melina Schools, MD;  Location: [place];  Service: Orthopedics;  Laterality: N/A;  Spinal Cord Battery Removal   TUBAL LIGATION  1988    Family History  Problem Relation Age of Onset   Depression Mother    Ovarian cancer Mother    Hypertension Mother     Hypercholesterolemia Mother    Obesity Mother    Hypertension Sister    Obesity Sister    Stroke Sister    Healthy Sister    Healthy Daughter    Tongue cancer Daughter    Healthy Daughter    Breast cancer Neg Hx     Social History:  reports that she quit smoking about 6 years ago. Her smoking use included cigarettes. She started smoking about 35 years ago. She has a 30.00 pack-year smoking history. She has never used smokeless tobacco. She reports that she does not drink alcohol and does not use drugs.  Allergies:  Allergies  Allergen Reactions   Aspirin Other (See Comments)    Causes heartburn even the chewable kind   Clonidine Derivatives Itching    Medications reviewed.    ROS Full ROS performed and is otherwise negative other than what is stated in HPI   BP 115/81   Pulse 91   Temp 98.5 F (36.9 C) (Oral)   Ht 5\' 8"  (1.727 m)   Wt 166 lb 9.6 oz (75.6 kg)   SpO2 97%   BMI 25.33 kg/m   Physical Exam CONSTITUTIONAL: NAD EYES: Pupils are equal, round,  Sclera are non-icteric. EARS, NOSE, MOUTH AND THROAT: The oropharynx is clear. The oral mucosa is pink and moist. Hearing is intact to voice. LYMPH NODES:  Lymph nodes in the neck are normal. RESPIRATORY:  Lungs are clear. There is normal respiratory effort, with equal breath sounds bilaterally, and without pathologic use of accessory muscles. CARDIOVASCULAR: Heart is regular without murmurs, gallops, or rubs. GI: The abdomen is  soft, nontender, and nondistended. There are no palpable masses. There is no hepatosplenomegaly. There are normal bowel sounds in all quadrants. Left inguinal scar GU: Rectal deferred.   MUSCULOSKELETAL: Normal muscle strength and tone. No cyanosis or edema.   SKIN: Turgor is good and there are no pathologic skin lesions or ulcers. NEUROLOGIC: Motor and sensation is grossly normal. Cranial nerves are grossly intact. PSYCH:  Oriented to person, place and time. Affect is  normal  Assessment/Plan: 57 year old female with recalcitrant reflux. There is no contraindication for antireflux procedure and patient wishes to be off the current medications for reflux. She also wishes to stop acid as she has read that there is progression of Barrett's. I discussed with her in detail about my thought process and I definitely agree that antireflux procedure will be able to get her off the PPIs and hold the progression of Barrett's.  She has no dysphagia and fundoplication Nissen robotically will be indicated.  Procedure discussed with the patient in detail.  Risks, benefits and possible implications including but not limited to, bleeding, infection, bowel injuries and esophageal injuries, dysphagia, bloating.  She agrees and wishes to proceed Please note that I spent greater than 40 minutes in this encounter including extensive review of medical records, personally reviewing imaging studies, counseling the patient and placing orders and performing appropriate documentation   Caroleen Hamman, MD [place] Surgeon

## 2022-03-21 NOTE — Patient Instructions (Addendum)
Your procedure is scheduled on:03-27-22 Tuesday Report to the Registration Desk on the 1st floor of the [facility].Then proceed to the 2nd floor Surgery Desk To find out your arrival time, please call [phone] between 1PM - 3PM on:03-26-22 Monday If your arrival time is 6:00 am, do not arrive prior to that time as the [place] entrance doors do not open until 6:00 am.  REMEMBER: Instructions that are not followed completely may result in serious medical risk, up to and including death; or upon the discretion of your surgeon and anesthesiologist your surgery may need to be rescheduled.  Do not eat food after midnight the night before surgery.  No gum chewing, lozengers or hard candies.  You may however, drink CLEAR liquids up to 2 hours before you are scheduled to arrive for your surgery. Do not drink anything within 2 hours of your scheduled arrival time.  Clear liquids include: - water  - apple juice without pulp - gatorade (not RED colors) - black coffee or tea (Do NOT add milk or creamers to the coffee or tea) Do NOT drink anything that is not on this list.  TAKE THESE MEDICATIONS THE MORNING OF SURGERY WITH A SIP OF WATER: -esomeprazole (NEXIUM)  -famotidine (PEPCID)  -rosuvastatin (CRESTOR)  -venlafaxine XR (EFFEXOR-XR)   One week prior to surgery: Stop Anti-inflammatories (NSAIDS) such as Advil, Aleve, Ibuprofen, Motrin, Naproxen, Naprosyn and Aspirin based products such as Excedrin, [place]ys Powder, BC Powder.You may however, take Tylenol if needed for pain up until the day of surgery.  Stop ANY OVER THE COUNTER supplements/vitamins NOW (03-21-22) until after surgery (Calcium and Vitamin D)  No Alcohol for 24 hours before or after surgery.  No Smoking including e-cigarettes for 24 hours prior to surgery.  No chewable tobacco products for at least 6 hours prior to surgery.  No nicotine patches on the day of surgery.  Do not use any "recreational" drugs for at least a  week prior to your surgery.  Please be advised that the combination of cocaine and anesthesia may have negative outcomes, up to and including death. If you test positive for cocaine, your surgery will be cancelled.  On the morning of surgery brush your teeth with toothpaste and water, you may rinse your mouth with mouthwash if you wish. Do not swallow any toothpaste or mouthwash.  Use CHG Soap as directed on instruction sheet.  Do not wear jewelry, make-up, hairpins, clips or nail polish.  Do not wear lotions, powders, or perfumes.   Do not shave body from the neck down 48 hours prior to surgery just in case you cut yourself which could leave a site for infection.  Also, freshly shaved skin may become irritated if using the CHG soap.  Contact lenses, hearing aids and dentures may not be worn into surgery.  Do not bring valuables to the hospital. [facility] is not responsible for any missing/lost belongings or valuables.   Notify your doctor if there is any change in your medical condition (cold, fever, infection).  Wear comfortable clothing (specific to your surgery type) to the hospital.  After surgery, you can help prevent lung complications by doing breathing exercises.  Take deep breaths and cough every 1-2 hours. Your doctor may order a device called an Incentive Spirometer to help you take deep breaths. When coughing or sneezing, hold a pillow firmly against your incision with both hands. This is called "splinting." Doing this helps protect your incision. It also decreases belly discomfort.  If you are being admitted to the hospital overnight, leave your suitcase in the car. After surgery it may be brought to your room.  If you are being discharged the day of surgery, you will not be allowed to drive home. You will need a responsible adult (18 years or older) to drive you home and stay with you that night.   If you are taking public transportation, you will need to have a  responsible adult (18 years or older) with you. Please confirm with your physician that it is acceptable to use public transportation.   Please call the [facility] at [phone] if you have any questions about these instructions.  Surgery Visitation Policy:  Patients undergoing a surgery or procedure may have two family members or support persons with them as long as the person is not COVID-19 positive or experiencing its symptoms.   Inpatient Visitation:    Visiting hours are 7 a.m. to 8 p.m. Up to four visitors are allowed at one time in a patient room. The visitors may rotate out with other people during the day. One designated support person (adult) may remain overnight.  Due to an increase in RSV and influenza rates and associated hospitalizations, children ages 15 and under will not be able to visit patients in [facility]. Masks continue to be strongly recommended.

## 2022-03-21 NOTE — Telephone Encounter (Signed)
Patient has been advised of Pre-Admission date/time, and Surgery date at [facility].  Surgery Date: 03/27/22 Preadmission Testing Date: 03/21/22 (phone 1p-5p)  Patient has been made aware to call [phone], between 1-3:00pm the day before surgery, to find out what time to arrive for surgery.

## 2022-03-23 ENCOUNTER — Encounter
Admission: RE | Admit: 2022-03-23 | Discharge: 2022-03-23 | Disposition: A | Discharge status: Home / Self Care | Payer: Medicare PPO | Source: Ambulatory Visit | Attending: Surgery | Admitting: Surgery

## 2022-03-23 DIAGNOSIS — K760 Fatty (change of) liver, not elsewhere classified: Secondary | ICD-10-CM | POA: Diagnosis not present

## 2022-03-23 DIAGNOSIS — Z01818 Encounter for other preprocedural examination: Secondary | ICD-10-CM | POA: Diagnosis not present

## 2022-03-23 DIAGNOSIS — J449 Chronic obstructive pulmonary disease, unspecified: Secondary | ICD-10-CM | POA: Insufficient documentation

## 2022-03-23 DIAGNOSIS — Z01810 Encounter for preprocedural cardiovascular examination: Secondary | ICD-10-CM | POA: Diagnosis not present

## 2022-03-23 DIAGNOSIS — Z01812 Encounter for preprocedural laboratory examination: Secondary | ICD-10-CM

## 2022-03-23 LAB — CBC
HCT: 40.4 % (ref 36.0–46.0)
Hemoglobin: 13.4 g/dL (ref 12.0–15.0)
MCH: 31.1 pg (ref 26.0–34.0)
MCHC: 33.2 g/dL (ref 30.0–36.0)
MCV: 93.7 fL (ref 80.0–100.0)
Platelets: 316 10*3/uL (ref 150–400)
RBC: 4.31 MIL/uL (ref 3.87–5.11)
RDW: 12 % (ref 11.5–15.5)
WBC: 7.3 10*3/uL (ref 4.0–10.5)
nRBC: 0 % (ref 0.0–0.2)

## 2022-03-23 LAB — COMPREHENSIVE METABOLIC PANEL
ALT: 20 U/L (ref 0–44)
AST: 19 U/L (ref 15–41)
Albumin: 4.4 g/dL (ref 3.5–5.0)
Alkaline Phosphatase: 74 U/L (ref 38–126)
Anion gap: 9 (ref 5–15)
BUN: 17 mg/dL (ref 6–20)
CO2: 26 mmol/L (ref 22–32)
Calcium: 9.2 mg/dL (ref 8.9–10.3)
Chloride: 99 mmol/L (ref 98–111)
Creatinine, Ser: 0.93 mg/dL (ref 0.44–1.00)
GFR, Estimated: >60 mL/min (ref >60)
Glucose, Bld: 95 mg/dL (ref 70–99)
Potassium: 4.4 mmol/L (ref 3.5–5.1)
Sodium: 134 mmol/L — ABNORMAL LOW (ref 135–145)
Total Bilirubin: 0.7 mg/dL (ref 0.3–1.2)
Total Protein: 8 g/dL (ref 6.5–8.1)

## 2022-03-26 MED ORDER — LACTATED RINGERS IV SOLN: INTRAVENOUS | Status: DC

## 2022-03-26 MED ORDER — CHLORHEXIDINE GLUCONATE 0.12 % MT SOLN: 15.0000 mL | Freq: Once | OROMUCOSAL | Status: DC

## 2022-03-26 MED ORDER — CEFAZOLIN SODIUM-DEXTROSE 2-4 GM/100ML-% IV SOLN
2.0000 g | INTRAVENOUS | Status: AC
  Administered 2022-03-27: 2 g via INTRAVENOUS

## 2022-03-26 MED ORDER — GABAPENTIN 300 MG PO CAPS: 300.0000 mg | ORAL_CAPSULE | ORAL | Status: AC

## 2022-03-26 MED ORDER — ACETAMINOPHEN 500 MG PO TABS: 1000.0000 mg | ORAL_TABLET | ORAL | Status: AC

## 2022-03-26 MED ORDER — CHLORHEXIDINE GLUCONATE CLOTH 2 % EX PADS
6.0000 | MEDICATED_PAD | Freq: Once | CUTANEOUS | Status: DC
  Administered 2022-03-27: 6 via TOPICAL

## 2022-03-26 MED ORDER — CHLORHEXIDINE GLUCONATE CLOTH 2 % EX PADS: 6.0000 | MEDICATED_PAD | Freq: Once | CUTANEOUS | Status: DC

## 2022-03-26 MED ORDER — ORAL CARE MOUTH RINSE: 15.0000 mL | Freq: Once | OROMUCOSAL | Status: DC

## 2022-03-27 ENCOUNTER — Observation Stay
Admission: RE | Admit: 2022-03-27 | Discharge: 2022-03-28 | Disposition: A | Discharge status: Home / Self Care | Payer: Medicare PPO | Attending: Surgery | Admitting: Surgery

## 2022-03-27 ENCOUNTER — Encounter: Payer: Self-pay | Admitting: Surgery

## 2022-03-27 ENCOUNTER — Ambulatory Visit: Payer: Medicare PPO | Admitting: Urgent Care

## 2022-03-27 ENCOUNTER — Encounter
Admission: RE | Disposition: A | Discharge status: Home / Self Care | Payer: Self-pay | Source: Home / Self Care | Attending: Surgery

## 2022-03-27 ENCOUNTER — Ambulatory Visit: Payer: Medicare PPO | Admitting: Registered Nurse

## 2022-03-27 ENCOUNTER — Other Ambulatory Visit: Payer: Self-pay

## 2022-03-27 DIAGNOSIS — K449 Diaphragmatic hernia without obstruction or gangrene: Secondary | ICD-10-CM | POA: Diagnosis not present

## 2022-03-27 DIAGNOSIS — K219 Gastro-esophageal reflux disease without esophagitis: Secondary | ICD-10-CM | POA: Diagnosis not present

## 2022-03-27 DIAGNOSIS — F418 Other specified anxiety disorders: Secondary | ICD-10-CM | POA: Diagnosis not present

## 2022-03-27 DIAGNOSIS — J449 Chronic obstructive pulmonary disease, unspecified: Secondary | ICD-10-CM | POA: Diagnosis not present

## 2022-03-27 DIAGNOSIS — Z87891 Personal history of nicotine dependence: Secondary | ICD-10-CM | POA: Diagnosis not present

## 2022-03-27 HISTORY — PX: INSERTION OF MESH: SHX5868

## 2022-03-27 HISTORY — PX: XI ROBOTIC ASSISTED PARAESOPHAGEAL HERNIA REPAIR: SHX6871

## 2022-03-27 LAB — CBC
HCT: 35.3 % — ABNORMAL LOW (ref 36.0–46.0)
Hemoglobin: 11.8 g/dL — ABNORMAL LOW (ref 12.0–15.0)
MCH: 31.1 pg (ref 26.0–34.0)
MCHC: 33.4 g/dL (ref 30.0–36.0)
MCV: 92.9 fL (ref 80.0–100.0)
Platelets: 274 10*3/uL (ref 150–400)
RBC: 3.8 MIL/uL — ABNORMAL LOW (ref 3.87–5.11)
RDW: 12.4 % (ref 11.5–15.5)
WBC: 12.6 10*3/uL — ABNORMAL HIGH (ref 4.0–10.5)
nRBC: 0 % (ref 0.0–0.2)

## 2022-03-27 LAB — HIV ANTIBODY (ROUTINE TESTING W REFLEX): HIV Screen 4th Generation wRfx: NONREACTIVE

## 2022-03-27 LAB — CREATININE, SERUM
Creatinine, Ser: 0.75 mg/dL (ref 0.44–1.00)
GFR, Estimated: >60 mL/min (ref >60)

## 2022-03-27 SURGERY — REPAIR, HERNIA, PARAESOPHAGEAL, ROBOT-ASSISTED
Anesthesia: General

## 2022-03-27 MED ORDER — GABAPENTIN 300 MG PO CAPS
ORAL_CAPSULE | ORAL | Status: AC
  Administered 2022-03-27: 300 mg via ORAL
  Filled 2022-03-27: qty 1

## 2022-03-27 MED ORDER — ACETAMINOPHEN 500 MG PO TABS
ORAL_TABLET | ORAL | Status: AC
  Administered 2022-03-27: 1000 mg via ORAL
  Filled 2022-03-27: qty 2

## 2022-03-27 MED ORDER — HYDROMORPHONE HCL 1 MG/ML IJ SOLN
INTRAMUSCULAR | Status: AC
  Filled 2022-03-27: qty 1

## 2022-03-27 MED ORDER — FENTANYL CITRATE (PF) 100 MCG/2ML IJ SOLN
INTRAMUSCULAR | Status: AC
  Filled 2022-03-27: qty 2

## 2022-03-27 MED ORDER — FENTANYL CITRATE (PF) 100 MCG/2ML IJ SOLN
25.0000 ug | INTRAMUSCULAR | Status: DC | PRN
  Administered 2022-03-27: 25 ug via INTRAVENOUS

## 2022-03-27 MED ORDER — ZOLPIDEM TARTRATE 5 MG PO TABS: 5.0000 mg | ORAL_TABLET | Freq: Every evening | ORAL | Status: DC | PRN

## 2022-03-27 MED ORDER — ONDANSETRON HCL 4 MG/2ML IJ SOLN
INTRAMUSCULAR | Status: DC | PRN
  Administered 2022-03-27: 4 mg via INTRAVENOUS

## 2022-03-27 MED ORDER — LIDOCAINE HCL (PF) 2 % IJ SOLN
INTRAMUSCULAR | Status: AC
  Filled 2022-03-27: qty 5

## 2022-03-27 MED ORDER — MORPHINE SULFATE (PF) 2 MG/ML IV SOLN: 2.0000 mg | INTRAVENOUS | Status: DC | PRN

## 2022-03-27 MED ORDER — GLYCOPYRROLATE 0.2 MG/ML IJ SOLN
INTRAMUSCULAR | Status: AC
  Filled 2022-03-27: qty 1

## 2022-03-27 MED ORDER — BUPIVACAINE HCL (PF) 0.25 % IJ SOLN
INTRAMUSCULAR | Status: AC
  Filled 2022-03-27: qty 30

## 2022-03-27 MED ORDER — BUPIVACAINE LIPOSOME 1.3 % IJ SUSP
INTRAMUSCULAR | Status: DC | PRN
  Administered 2022-03-27: 20 mL

## 2022-03-27 MED ORDER — LACTATED RINGERS IV SOLN: INTRAVENOUS | Status: DC | PRN

## 2022-03-27 MED ORDER — PROCHLORPERAZINE EDISYLATE 10 MG/2ML IJ SOLN: 5.0000 mg | Freq: Four times a day (QID) | INTRAMUSCULAR | Status: DC | PRN

## 2022-03-27 MED ORDER — EPHEDRINE SULFATE (PRESSORS) 50 MG/ML IJ SOLN
INTRAMUSCULAR | Status: DC | PRN
  Administered 2022-03-27 (×5): 5 mg via INTRAVENOUS

## 2022-03-27 MED ORDER — HYDRALAZINE HCL 20 MG/ML IJ SOLN: 10.0000 mg | INTRAMUSCULAR | Status: DC | PRN

## 2022-03-27 MED ORDER — LACTATED RINGERS IV SOLN: INTRAVENOUS | Status: DC

## 2022-03-27 MED ORDER — CHLORHEXIDINE GLUCONATE 0.12 % MT SOLN
OROMUCOSAL | Status: AC
  Filled 2022-03-27: qty 15

## 2022-03-27 MED ORDER — VISTASEAL 10 ML SINGLE DOSE KIT
PACK | CUTANEOUS | Status: AC
  Filled 2022-03-27: qty 10

## 2022-03-27 MED ORDER — SODIUM CHLORIDE 0.9 % IV SOLN: INTRAVENOUS | Status: DC

## 2022-03-27 MED ORDER — DEXAMETHASONE SODIUM PHOSPHATE 10 MG/ML IJ SOLN
INTRAMUSCULAR | Status: DC | PRN
  Administered 2022-03-27: 10 mg via INTRAVENOUS

## 2022-03-27 MED ORDER — FENTANYL CITRATE (PF) 100 MCG/2ML IJ SOLN
INTRAMUSCULAR | Status: DC | PRN
  Administered 2022-03-27 (×2): 50 ug via INTRAVENOUS

## 2022-03-27 MED ORDER — ROCURONIUM BROMIDE 100 MG/10ML IV SOLN
INTRAVENOUS | Status: DC | PRN
  Administered 2022-03-27: 10 mg via INTRAVENOUS
  Administered 2022-03-27: 20 mg via INTRAVENOUS
  Administered 2022-03-27: 10 mg via INTRAVENOUS
  Administered 2022-03-27: 20 mg via INTRAVENOUS
  Administered 2022-03-27: 30 mg via INTRAVENOUS

## 2022-03-27 MED ORDER — ONDANSETRON HCL 4 MG/2ML IJ SOLN
INTRAMUSCULAR | Status: AC
  Filled 2022-03-27: qty 2

## 2022-03-27 MED ORDER — PHENYLEPHRINE 80 MCG/ML (10ML) SYRINGE FOR IV PUSH (FOR BLOOD PRESSURE SUPPORT)
PREFILLED_SYRINGE | INTRAVENOUS | Status: DC | PRN
  Administered 2022-03-27 (×2): 80 ug via INTRAVENOUS
  Administered 2022-03-27: 160 ug via INTRAVENOUS
  Administered 2022-03-27 (×3): 80 ug via INTRAVENOUS

## 2022-03-27 MED ORDER — ACETAMINOPHEN 500 MG PO TABS
ORAL_TABLET | ORAL | Status: AC
  Filled 2022-03-27: qty 2

## 2022-03-27 MED ORDER — PROCHLORPERAZINE MALEATE 10 MG PO TABS: 10.0000 mg | ORAL_TABLET | Freq: Four times a day (QID) | ORAL | Status: DC | PRN

## 2022-03-27 MED ORDER — SUCCINYLCHOLINE CHLORIDE 200 MG/10ML IV SOSY
PREFILLED_SYRINGE | INTRAVENOUS | Status: DC | PRN
  Administered 2022-03-27: 100 mg via INTRAVENOUS

## 2022-03-27 MED ORDER — MIDAZOLAM HCL 2 MG/2ML IJ SOLN
INTRAMUSCULAR | Status: AC
  Filled 2022-03-27: qty 2

## 2022-03-27 MED ORDER — DIPHENHYDRAMINE HCL 12.5 MG/5ML PO ELIX: 12.5000 mg | ORAL_SOLUTION | Freq: Four times a day (QID) | ORAL | Status: DC | PRN

## 2022-03-27 MED ORDER — ENOXAPARIN SODIUM 40 MG/0.4ML IJ SOSY: 40.0000 mg | PREFILLED_SYRINGE | INTRAMUSCULAR | Status: DC

## 2022-03-27 MED ORDER — PHENYLEPHRINE HCL-NACL 20-0.9 MG/250ML-% IV SOLN
INTRAVENOUS | Status: DC | PRN
  Administered 2022-03-27: 20 ug/min via INTRAVENOUS

## 2022-03-27 MED ORDER — ONDANSETRON HCL 4 MG/2ML IJ SOLN: 4.0000 mg | Freq: Four times a day (QID) | INTRAMUSCULAR | Status: DC | PRN

## 2022-03-27 MED ORDER — DEXAMETHASONE SODIUM PHOSPHATE 10 MG/ML IJ SOLN
INTRAMUSCULAR | Status: AC
  Filled 2022-03-27: qty 1

## 2022-03-27 MED ORDER — BUPIVACAINE LIPOSOME 1.3 % IJ SUSP
INTRAMUSCULAR | Status: AC
  Filled 2022-03-27: qty 20

## 2022-03-27 MED ORDER — OXYCODONE HCL 5 MG/5ML PO SOLN: 5.0000 mg | Freq: Once | ORAL | Status: DC | PRN

## 2022-03-27 MED ORDER — DIPHENHYDRAMINE HCL 50 MG/ML IJ SOLN: 12.5000 mg | Freq: Four times a day (QID) | INTRAMUSCULAR | Status: DC | PRN

## 2022-03-27 MED ORDER — SUGAMMADEX SODIUM 200 MG/2ML IV SOLN
INTRAVENOUS | Status: DC | PRN
  Administered 2022-03-27: 200 mg via INTRAVENOUS

## 2022-03-27 MED ORDER — OXYCODONE HCL 5 MG PO TABS: 5.0000 mg | ORAL_TABLET | Freq: Once | ORAL | Status: DC | PRN

## 2022-03-27 MED ORDER — ACETAMINOPHEN 10 MG/ML IV SOLN: 1000.0000 mg | Freq: Once | INTRAVENOUS | Status: DC | PRN

## 2022-03-27 MED ORDER — OXYCODONE HCL 5 MG PO TABS
5.0000 mg | ORAL_TABLET | ORAL | Status: DC | PRN
  Administered 2022-03-27: 5 mg via ORAL

## 2022-03-27 MED ORDER — ACETAMINOPHEN 500 MG PO TABS
1000.0000 mg | ORAL_TABLET | Freq: Four times a day (QID) | ORAL | Status: DC
  Administered 2022-03-27: 1000 mg via ORAL

## 2022-03-27 MED ORDER — VENLAFAXINE HCL ER 150 MG PO CP24
150.0000 mg | ORAL_CAPSULE | Freq: Every day | ORAL | Status: DC
  Administered 2022-03-28: 150 mg via ORAL
  Filled 2022-03-27: qty 1

## 2022-03-27 MED ORDER — BUPIVACAINE-EPINEPHRINE (PF) 0.5% -1:200000 IJ SOLN
INTRAMUSCULAR | Status: AC
  Filled 2022-03-27: qty 30

## 2022-03-27 MED ORDER — HYDROMORPHONE HCL 1 MG/ML IJ SOLN
INTRAMUSCULAR | Status: DC | PRN
  Administered 2022-03-27: .2 mg via INTRAVENOUS
  Administered 2022-03-27: .3 mg via INTRAVENOUS

## 2022-03-27 MED ORDER — CEFAZOLIN SODIUM-DEXTROSE 2-4 GM/100ML-% IV SOLN
INTRAVENOUS | Status: AC
  Filled 2022-03-27: qty 100

## 2022-03-27 MED ORDER — PROPOFOL 10 MG/ML IV BOLUS
INTRAVENOUS | Status: DC | PRN
  Administered 2022-03-27: 120 mg via INTRAVENOUS

## 2022-03-27 MED ORDER — EPINEPHRINE PF 1 MG/ML IJ SOLN
INTRAMUSCULAR | Status: AC
  Filled 2022-03-27: qty 1

## 2022-03-27 MED ORDER — OXYCODONE HCL 5 MG PO TABS
ORAL_TABLET | ORAL | Status: AC
  Filled 2022-03-27: qty 1

## 2022-03-27 MED ORDER — FENTANYL CITRATE (PF) 100 MCG/2ML IJ SOLN
INTRAMUSCULAR | Status: AC
  Administered 2022-03-27: 25 ug via INTRAVENOUS
  Filled 2022-03-27: qty 2

## 2022-03-27 MED ORDER — LIDOCAINE HCL (CARDIAC) PF 100 MG/5ML IV SOSY
PREFILLED_SYRINGE | INTRAVENOUS | Status: DC | PRN
  Administered 2022-03-27: 30 mg via INTRAVENOUS

## 2022-03-27 MED ORDER — BUPIVACAINE-EPINEPHRINE 0.5% -1:200000 IJ SOLN
INTRAMUSCULAR | Status: DC | PRN
  Administered 2022-03-27: 30 mL

## 2022-03-27 MED ORDER — ROCURONIUM BROMIDE 10 MG/ML (PF) SYRINGE
PREFILLED_SYRINGE | INTRAVENOUS | Status: AC
  Filled 2022-03-27: qty 10

## 2022-03-27 MED ORDER — ONDANSETRON 4 MG PO TBDP: 4.0000 mg | ORAL_TABLET | Freq: Four times a day (QID) | ORAL | Status: DC | PRN

## 2022-03-27 MED ORDER — ONDANSETRON HCL 4 MG/2ML IJ SOLN: 4.0000 mg | Freq: Once | INTRAMUSCULAR | Status: DC | PRN

## 2022-03-27 MED ORDER — MIDAZOLAM HCL 2 MG/2ML IJ SOLN
INTRAMUSCULAR | Status: DC | PRN
  Administered 2022-03-27: 1 mg via INTRAVENOUS

## 2022-03-27 SURGICAL SUPPLY — 57 items
ADH SKN CLS APL DERMABOND .7 (GAUZE/BANDAGES/DRESSINGS) ×2
APL LAPSCP 35 DL APL RGD (MISCELLANEOUS) ×2
APPLICATOR VISTASEAL 35 (MISCELLANEOUS) IMPLANT
CANNULA REDUC XI 12-8 STAPL (CANNULA) ×2
CANNULA REDUCER 12-8 DVNC XI (CANNULA) ×3 IMPLANT
DERMABOND ADVANCED .7 DNX12 (GAUZE/BANDAGES/DRESSINGS) ×3 IMPLANT
DRAPE ARM DVNC X/XI (DISPOSABLE) ×12 IMPLANT
DRAPE COLUMN DVNC XI (DISPOSABLE) ×3 IMPLANT
DRAPE DA VINCI XI ARM (DISPOSABLE) ×8
DRAPE DA VINCI XI COLUMN (DISPOSABLE) ×2
ELECT CAUTERY BLADE 6.4 (BLADE) ×3 IMPLANT
ELECT REM PT RETURN 9FT ADLT (ELECTROSURGICAL) ×2
ELECTRODE REM PT RTRN 9FT ADLT (ELECTROSURGICAL) ×3 IMPLANT
GLOVE BIO SURGEON STRL SZ7 (GLOVE) ×9 IMPLANT
GOWN STRL REUS W/ TWL LRG LVL3 (GOWN DISPOSABLE) ×12 IMPLANT
GOWN STRL REUS W/TWL LRG LVL3 (GOWN DISPOSABLE) ×8
GRASPER LAPSCPC 5X45 DSP (INSTRUMENTS) ×3 IMPLANT
IRRIGATION STRYKERFLOW (MISCELLANEOUS) IMPLANT
IRRIGATOR STRYKERFLOW (MISCELLANEOUS) ×2
IV NS 1000ML (IV SOLUTION) ×2
IV NS 1000ML BAXH (IV SOLUTION) IMPLANT
KIT PINK PAD W/HEAD ARE REST (MISCELLANEOUS) ×2
KIT PINK PAD W/HEAD ARM REST (MISCELLANEOUS) ×3 IMPLANT
LABEL OR SOLS (LABEL) ×3 IMPLANT
MANIFOLD NEPTUNE II (INSTRUMENTS) ×3 IMPLANT
MESH BIO-A 7X10 SYN MAT (Mesh General) IMPLANT
NEEDLE HYPO 22GX1.5 SAFETY (NEEDLE) ×3 IMPLANT
OBTURATOR OPTICAL STANDARD 8MM (TROCAR) ×2
OBTURATOR OPTICAL STND 8 DVNC (TROCAR) ×2
OBTURATOR OPTICALSTD 8 DVNC (TROCAR) ×2 IMPLANT
PACK LAP CHOLECYSTECTOMY (MISCELLANEOUS) ×3 IMPLANT
PENCIL SMOKE EVACUATOR (MISCELLANEOUS) ×3 IMPLANT
SEAL CANN UNIV 5-8 DVNC XI (MISCELLANEOUS) ×9 IMPLANT
SEAL XI 5MM-8MM UNIVERSAL (MISCELLANEOUS) ×6
SEALER VESSEL DA VINCI XI (MISCELLANEOUS) ×2
SEALER VESSEL EXT DVNC XI (MISCELLANEOUS) ×3 IMPLANT
SOLUTION ELECTROLUBE (MISCELLANEOUS) ×3 IMPLANT
SPIKE FLUID TRANSFER (MISCELLANEOUS) ×3 IMPLANT
SPONGE T-LAP 18X18 ~~LOC~~+RFID (SPONGE) ×2 IMPLANT
STAPLER CANNULA SEAL DVNC XI (STAPLE) ×3 IMPLANT
STAPLER CANNULA SEAL XI (STAPLE) ×2
SUT MNCRL 4-0 (SUTURE) ×4
SUT MNCRL 4-0 27XMFL (SUTURE) ×4
SUT SILK 2 0 SH (SUTURE) ×6 IMPLANT
SUT VIC AB 3-0 SH 27 (SUTURE) ×2
SUT VIC AB 3-0 SH 27X BRD (SUTURE) IMPLANT
SUT VICRYL 0 UR6 27IN ABS (SUTURE) ×6 IMPLANT
SUT VLOC 90 S/L VL9 GS22 (SUTURE) ×3 IMPLANT
SUTURE MNCRL 4-0 27XMF (SUTURE) ×3 IMPLANT
SYR 30ML LL (SYRINGE) ×3 IMPLANT
SYS BAG RETRIEVAL 10MM (BASKET)
SYSTEM BAG RETRIEVAL 10MM (BASKET) IMPLANT
TRAP FLUID SMOKE EVACUATOR (MISCELLANEOUS) ×3 IMPLANT
TRAY FOLEY SLVR 16FR LF STAT (SET/KITS/TRAYS/PACK) ×3 IMPLANT
TROCAR XCEL NON-BLD 5MMX100MML (ENDOMECHANICALS) ×3 IMPLANT
TUBING EVAC SMOKE HEATED PNEUM (TUBING) ×3 IMPLANT
WATER STERILE IRR 500ML POUR (IV SOLUTION) ×3 IMPLANT

## 2022-03-27 NOTE — Transfer of Care (Signed)
Immediate Anesthesia Transfer of Care Note  Patient: Tara [name]  Procedure(s) Performed: XI ROBOTIC ASSISTED PARAESOPHAGEAL HERNIA REPAIR, RNFA to assist INSERTION OF MESH  Patient Location: PACU  Anesthesia Type:General  Level of Consciousness: awake  Airway & Oxygen Therapy: Patient Spontanous Breathing and Patient connected to face mask oxygen  Post-op Assessment: Report given to RN, Post -op Vital signs reviewed and stable, and Patient moving all extremities X 4  Post vital signs: Reviewed and stable  Last Vitals:  Vitals Value Taken Time  BP 143/48 03/27/22 1008  Temp    Pulse 91 03/27/22 1011  Resp 23 03/27/22 1011  SpO2 96 % 03/27/22 1011  Vitals shown include unvalidated device data.  Last Pain:  Vitals:   03/27/22 0620  TempSrc: Temporal  PainSc: 0-No pain         Complications: No notable events documented.

## 2022-03-27 NOTE — Anesthesia Procedure Notes (Signed)
Procedure Name: Intubation Date/Time: 03/27/2022 7:43 AM  Performed by: Otho Perl, CRNAPre-anesthesia Checklist: Patient identified, Patient being monitored, Timeout performed, Emergency Drugs available and Suction available Patient Re-evaluated:Patient Re-evaluated prior to induction Oxygen Delivery Method: Circle system utilized Preoxygenation: Pre-oxygenation with 100% oxygen Induction Type: IV induction Ventilation: Mask ventilation without difficulty Laryngoscope Size: Mac and 3 Grade View: Grade I Tube type: Oral Tube size: 7.0 mm Number of attempts: 1 Airway Equipment and Method: Stylet Placement Confirmation: ETT inserted through vocal cords under direct vision, positive ETCO2 and breath sounds checked- equal and bilateral Secured at: 21 cm Tube secured with: Tape Dental Injury: Teeth and Oropharynx as per pre-operative assessment

## 2022-03-27 NOTE — Anesthesia Postprocedure Evaluation (Signed)
Anesthesia Post Note  Patient: Tara [name]  Procedure(s) Performed: XI ROBOTIC ASSISTED PARAESOPHAGEAL HERNIA REPAIR, RNFA to assist INSERTION OF MESH  Patient location during evaluation: PACU Anesthesia Type: General Level of consciousness: awake and alert, oriented and patient cooperative Pain management: pain level controlled Vital Signs Assessment: post-procedure vital signs reviewed and stable Respiratory status: spontaneous breathing, nonlabored ventilation and respiratory function stable Cardiovascular status: blood pressure returned to baseline and stable Postop Assessment: adequate PO intake Anesthetic complications: no   No notable events documented.   Last Vitals:  Vitals:   03/27/22 1029 03/27/22 1030  BP: (!) 125/51 (!) 125/51  Pulse: 86 86  Resp: 20 18  Temp:    SpO2: 98% 98%    Last Pain:  Vitals:   03/27/22 1029  TempSrc:   PainSc: Asleep                 Darrin Nipper

## 2022-03-27 NOTE — Anesthesia Preprocedure Evaluation (Addendum)
Anesthesia Evaluation  Patient identified by MRN, date of birth, ID band Patient awake    Reviewed: Allergy & Precautions, NPO status , Patient's Chart, lab work & pertinent test results  History of Anesthesia Complications Negative for: history of anesthetic complications  Airway Mallampati: II   Neck ROM: Full    Dental no notable dental hx.    Pulmonary COPD, former smoker (quit 2018)   Pulmonary exam normal breath sounds clear to auscultation       Cardiovascular Exercise Tolerance: Good negative cardio ROS Normal cardiovascular exam Rhythm:Regular Rate:Normal  ECG 03/23/22: normal   Neuro/Psych  Headaches PSYCHIATRIC DISORDERS Anxiety Depression    Chronic back pain  Neuromuscular disease (CRPS right upper limb)    GI/Hepatic ,GERD (Barrett esophagus)  ,,Fatty liver   Endo/Other  negative endocrine ROS    Renal/GU negative Renal ROS     Musculoskeletal  (+) Arthritis ,    Abdominal   Peds  Hematology negative hematology ROS (+)   Anesthesia Other Findings   Reproductive/Obstetrics                             Anesthesia Physical Anesthesia Plan  ASA: 2  Anesthesia Plan: General   Post-op Pain Management:    Induction: Intravenous  PONV Risk Score and Plan: 3 and Ondansetron, Dexamethasone and Treatment may vary due to age or medical condition  Airway Management Planned: Oral ETT  Additional Equipment:   Intra-op Plan:   Post-operative Plan: Extubation in OR  Informed Consent: I have reviewed the patients History and Physical, chart, labs and discussed the procedure including the risks, benefits and alternatives for the proposed anesthesia with the patient or authorized representative who has indicated his/her understanding and acceptance.     Dental advisory given  Plan Discussed with: CRNA  Anesthesia Plan Comments: (Patient consented for risks of anesthesia  including but not limited to:  - adverse reactions to medications - damage to eyes, teeth, lips or other oral mucosa - nerve damage due to positioning  - sore throat or hoarseness - damage to heart, brain, nerves, lungs, other parts of body or loss of life  Informed patient about role of CRNA in peri- and intra-operative care.  Patient voiced understanding.)        Anesthesia Quick Evaluation

## 2022-03-27 NOTE — Interval H&P Note (Signed)
History and Physical Interval Note:  03/27/2022 7:20 AM  Tara [name]  has presented today for surgery, with the diagnosis of paraesophageal hernia.  The various methods of treatment have been discussed with the patient and family. After consideration of risks, benefits and other options for treatment, the patient has consented to  Procedure(s): XI ROBOTIC ASSISTED PARAESOPHAGEAL HERNIA REPAIR, RNFA to assist (N/A) as a surgical intervention.  The patient's history has been reviewed, patient examined, no change in status, stable for surgery.  I have reviewed the patient's chart and labs.  Questions were answered to the patient's satisfaction.     [place]

## 2022-03-27 NOTE — Op Note (Signed)
Robotic assisted laparoscopic repair of  Hiatal  hernia with Bio-A Mesh and Nissen fundoplication  Pre-operative Diagnosis: Recalcitrant GERD, Sliding hiatal hernia  Post-operative Diagnosis: same  Procedure:  Robotic assisted laparoscopic repair of  Hiatal hernia with Bio-A Mesh and Nissen fundoplication  Surgeon: Caroleen Hamman, MD FACS  Assistant: .Lawernce Pitts , RNFA Required due to the complexity of the case the need for exposure and lack of first assist.  Anesthesia: Gen. with endotracheal tube  Findings: Type I sliding paraesophageal Hernia Loose wrap 360 degree over 50 FR Bougie   Estimated Blood Loss: 20cc       Specimens: sac           Complications: none   Procedure Details  The patient was seen again in the Holding Room. The benefits, complications, treatment options, and expected outcomes were discussed with the patient. The risks of bleeding, infection, recurrence of symptoms, failure to resolve symptoms,  esophageal damage, Dysphagia, bowel injury, any of which could require further surgery were reviewed with the patient. The likelihood of improving the patient's symptoms with return to their baseline status is good.  The patient and/or family concurred with the proposed plan, giving informed consent.  The patient was taken to Operating Room, identified  and the procedure verified.  A Time Out was held and the above information confirmed.  Prior to the induction of general anesthesia, antibiotic prophylaxis was administered. VTE prophylaxis was in place. General endotracheal anesthesia was then administered and tolerated well. After the induction, the abdomen was prepped with Chloraprep and draped in the sterile fashion. The patient was positioned in the supine position.  Cut down technique was used to enter the abdominal cavity and a Hasson trochar was placed after two vicryl stitches were anchored to the fascia. Pneumoperitoneum was then created with CO2 and  tolerated well without any adverse changes in the patient's vital signs.  Three 8-mm ports were placed under direct vision. All skin incisions  were infiltrated with a local anesthetic agent before making the incision and placing the trocars. An additional 5 mm regular laparoscopic port was placed to assist with retraction and exposure.   The patient was positioned  in reverse Trendelenburg, robot was brought to the surgical field and docked in the standard fashion.  We made sure all the instrumentation was kept indirect view at all times and that there were no collision between the arms. I scrubbed out and went to the console.  I used a robotic arm to retract the liver, the vessel sealer on my right hand and a forced bipolar grasper on my left hand.  There is along the extra 5 mm port allow me ample exposure and the ability to perform meticulous dissection  We Started dividing the lesser omentum via the pars flaccida.  We Were able to dissect the lesser curvature of the stomach and  dissected the fundus free from the right and left crus.  We circumferentially dissected the GE junction.   we were able to bring the stomach into the intra-abdominal position.  Attention then was turned to the greater curvature where the short gastrics were divided with sealer device.  We were able to identify the left crus and again were able to make sure there was a good circumferential dissection and that the hernia sac was completely excised.  We did perform a good dissection within the mediastinum to allow a good esophageal length and a to completely allow an intra-abdominal fundoplication. Using two strips of Bio-A  as pledgets we approximated the crus with a 2-0V lock suture. A bio-A 10x7 cm mesh was inserted and secured using Vistaseal.   We Asked anesthesia to place a 50 French bougie and this went easily.  We also observe trajectory of the bougie. 360 degree Nissen fundoplication was created with multiple 2-0 silk  sutures and we placed 3 stitches taking some of the esophagus within that bite.  The fundoplication measured approximately 3-1/2 cm and he was floppy. I was very happy with the way the fundoplication laid and the repair of the crus.  Inspection of the  upper quadrant was performed. No bleeding, bile  Or esophageal injuries leaks, or bowel injuries were noted. Robotic instruments and robotic arms were undocked in the standard fashion. All the needles were removed under direct visualization.   I scrubbed back in.  Pneumoperitoneum was released.  The periumbilical port site was closed with interrumpted 0 Vicryl sutures. 4-0 subcuticular Monocryl was used to close the skin. Liposomal marcaine was injected to all the incisions sites.  Dermabond was  applied.  The patient was then extubated and brought to the recovery room in stable condition. Sponge, lap, and needle counts were correct at closure and at the conclusion of the case.               Caroleen Hamman, MD, FACS

## 2022-03-28 ENCOUNTER — Encounter: Payer: Self-pay | Admitting: Surgery

## 2022-03-28 DIAGNOSIS — K449 Diaphragmatic hernia without obstruction or gangrene: Secondary | ICD-10-CM | POA: Diagnosis not present

## 2022-03-28 DIAGNOSIS — Z87891 Personal history of nicotine dependence: Secondary | ICD-10-CM | POA: Diagnosis not present

## 2022-03-28 DIAGNOSIS — K219 Gastro-esophageal reflux disease without esophagitis: Secondary | ICD-10-CM | POA: Diagnosis not present

## 2022-03-28 DIAGNOSIS — J449 Chronic obstructive pulmonary disease, unspecified: Secondary | ICD-10-CM | POA: Diagnosis not present

## 2022-03-28 LAB — CBC
HCT: 34.9 % — ABNORMAL LOW (ref 36.0–46.0)
Hemoglobin: 11.4 g/dL — ABNORMAL LOW (ref 12.0–15.0)
MCH: 30.6 pg (ref 26.0–34.0)
MCHC: 32.7 g/dL (ref 30.0–36.0)
MCV: 93.8 fL (ref 80.0–100.0)
Platelets: 260 10*3/uL (ref 150–400)
RBC: 3.72 MIL/uL — ABNORMAL LOW (ref 3.87–5.11)
RDW: 12.3 % (ref 11.5–15.5)
WBC: 12.7 10*3/uL — ABNORMAL HIGH (ref 4.0–10.5)
nRBC: 0 % (ref 0.0–0.2)

## 2022-03-28 LAB — BASIC METABOLIC PANEL
Anion gap: 6 (ref 5–15)
BUN: 9 mg/dL (ref 6–20)
CO2: 26 mmol/L (ref 22–32)
Calcium: 8.5 mg/dL — ABNORMAL LOW (ref 8.9–10.3)
Chloride: 106 mmol/L (ref 98–111)
Creatinine, Ser: 0.73 mg/dL (ref 0.44–1.00)
GFR, Estimated: >60 mL/min (ref >60)
Glucose, Bld: 96 mg/dL (ref 70–99)
Potassium: 3.9 mmol/L (ref 3.5–5.1)
Sodium: 138 mmol/L (ref 135–145)

## 2022-03-28 MED ORDER — OXYCODONE HCL 5 MG PO TABS: 5.0000 mg | ORAL_TABLET | Freq: Four times a day (QID) | ORAL | 0 refills | Status: DC | PRN

## 2022-03-28 MED ORDER — ACETAMINOPHEN 500 MG PO TABS
ORAL_TABLET | ORAL | Status: AC
  Administered 2022-03-28: 1000 mg via ORAL
  Filled 2022-03-28: qty 2

## 2022-03-28 MED ORDER — ONDANSETRON 4 MG PO TBDP: 4.0000 mg | ORAL_TABLET | Freq: Four times a day (QID) | ORAL | 0 refills | Status: DC | PRN

## 2022-03-28 MED ORDER — ENOXAPARIN SODIUM 40 MG/0.4ML IJ SOSY
PREFILLED_SYRINGE | INTRAMUSCULAR | Status: AC
  Administered 2022-03-28: 40 mg via SUBCUTANEOUS
  Filled 2022-03-28: qty 0.4

## 2022-03-28 NOTE — Discharge Instructions (Signed)
In addition to included general post-operative instructions,  Diet: Recommend following Nissen diet restrictions for a minimum of 4 weeks. You were provided a handout regarding this.   Activity: No heavy lifting >20 pounds (children, pets, laundry, garbage) or strenuous activity for 4 weeks from date of surgery, but light activity and walking are encouraged. Do not drive or drink alcohol if taking narcotic pain medications or having pain that might distract from driving.  Wound care: 2 days after surgery (01/18), you may shower/get incision wet with soapy water and pat dry (do not rub incisions), but no baths or submerging incision underwater until follow-up.   Medications: Resume all home medications. For mild to moderate pain: acetaminophen (Tylenol) or ibuprofen/naproxen (if no kidney disease). Combining Tylenol with alcohol can substantially increase your risk of causing liver disease. Narcotic pain medications, if prescribed, can be used for severe pain, though may cause nausea, constipation, and drowsiness. Do not combine Tylenol and Percocet (or similar) within a 6 hour period as Percocet (and similar) contain(s) Tylenol. If you do not need the narcotic pain medication, you do not need to fill the prescription.  Call office [phone] / [phone]) at any time if any questions, worsening pain, fevers/chills, bleeding, drainage from incision site, or other concerns.

## 2022-03-28 NOTE — Discharge Summary (Signed)
[facility] SURGICAL DISCHARGE SUMMARY  Patient ID: Tara [name] MRN: [id number] DOB/AGE: 1966/01/31 57 y.o.  Admit date: 03/27/2022 Discharge date: 03/28/2022  Discharge Diagnoses Patient Active Problem List   Diagnosis Date Noted   Hiatal hernia 03/27/2022   Gastroesophageal reflux disease    Gastro-esophageal reflux disease without esophagitis 09/08/2014    Consultants None  Procedures 03/27/2022: Robotic assisted laparoscopic hiatal hernia repair with Nissen fundoplication   HPI: Tara [name] is a 57 y.o. female with history of GERD who presents to [facility] on 01/16 for scheduled repair  Hospital Course: Informed consent was obtained and documented, and patient underwent uneventful Robotic assisted laparoscopic hiatal hernia repair with Nissen fundoplication (Dr Dahlia Byes, [id number]).  Post-operatively, patient did very well. Advancement of patient's diet and ambulation were well-tolerated. The remainder of patient's hospital course was essentially unremarkable, and discharge planning was initiated accordingly with patient safely able to be discharged home with appropriate discharge instructions, pain control, and outpatient follow-up after all of her questions were answered to her expressed satisfaction.   Discharge Condition: Good   Physical Examination:  Constitutional: well appearing female, NAD Pulmonary: Normal effort, no respiratory distress Gastrointestinal: Soft, mild incisional soreness at umbilicus, non-distended, no rebound/guarding  Skin: Laparoscopic incisions are CDI with dermabond, no erythema or drainage    Allergies as of 03/28/2022       Reactions   Aspirin Other (See Comments)   Causes heartburn even the chewable kind   Clonidine Derivatives Itching        Medication List     TAKE these medications    CALCIUM PO Take 1 tablet by mouth 2 (two) times daily.   esomeprazole 40 MG capsule Commonly known as: NEXIUM Take 1  capsule (40 mg total) by mouth 2 (two) times daily.   Eszopiclone 3 MG Tabs Commonly known as: eszopiclone Take 1 tablet (3 mg total) by mouth at bedtime. Take immediately before bedtime   famotidine 20 MG tablet Commonly known as: Pepcid Take 1 tablet (20 mg total) by mouth 2 (two) times daily.   omeprazole 20 MG tablet Commonly known as: PRILOSEC OTC Take 20 mg by mouth daily.   ondansetron 4 MG disintegrating tablet Commonly known as: ZOFRAN-ODT Take 1 tablet (4 mg total) by mouth every 6 (six) hours as needed for nausea.   oxyCODONE 5 MG immediate release tablet Commonly known as: Oxy IR/ROXICODONE Take 1 tablet (5 mg total) by mouth every 6 (six) hours as needed for severe pain or breakthrough pain.   rosuvastatin 10 MG tablet Commonly known as: Crestor Take 1 tablet (10 mg total) by mouth daily. What changed: when to take this   Trelegy Ellipta 100-62.5-25 MCG/ACT Aepb Generic drug: Fluticasone-Umeclidin-Vilant Inhale 1 puff into the lungs daily. What changed:  when to take this reasons to take this   venlafaxine XR 150 MG 24 hr capsule Commonly known as: EFFEXOR-XR TAKE 1 CAPSULE(150 MG) BY MOUTH DAILY WITH BREAKFAST What changed: See the new instructions.   VITAMIN D PO Take 1 tablet by mouth 2 (two) times daily.          Follow-up Information     P[place]on, [place] F, MD. Schedule an appointment as soon as possible for a visit in 3 week(s).   Specialty: General Surgery Why: follow up in 2-3 weeks; s/p hiatal hernia repair Contact information: [address] [place] [address] [phone]  Time spent on discharge management including discussion of hospital course, clinical condition, outpatient instructions, prescriptions, and follow up with the patient and members of the medical team: >30 minutes  -- Edison Simon , PA-C [facility]  03/28/2022, 9:03 AM [phone] M-F: 7am - 4pm

## 2022-04-02 ENCOUNTER — Telehealth: Payer: Self-pay

## 2022-04-02 NOTE — Telephone Encounter (Signed)
Spoke with the patient and let her know per Dr Dahlia Byes that sometimes this will just need more time for healing. She is alright to continue her antacid medications and follow up as scheduled.

## 2022-04-02 NOTE — Telephone Encounter (Signed)
Patient states that she had surgery on 03/27/22 for paraesophageal hernia repair. She reports that she is still having the burning in her throat and the back of her tongue. She has restarted her Omeprazole twice a day and also her Famotidine twice a day on Friday. She is concerned about this and wanted to know if this was normal or not or if she just needs more healing time.

## 2022-04-16 ENCOUNTER — Encounter: Payer: Self-pay | Admitting: Surgery

## 2022-04-16 ENCOUNTER — Other Ambulatory Visit: Payer: Self-pay | Admitting: Family Medicine

## 2022-04-16 ENCOUNTER — Encounter: Payer: Self-pay | Admitting: Family Medicine

## 2022-04-16 ENCOUNTER — Other Ambulatory Visit: Payer: Self-pay

## 2022-04-16 ENCOUNTER — Ambulatory Visit (INDEPENDENT_AMBULATORY_CARE_PROVIDER_SITE_OTHER): Payer: Medicare PPO | Admitting: Surgery

## 2022-04-16 VITALS — BP 141/81 | HR 86 | Temp 98.1°F | Ht 68.0 in | Wt 164.2 lb

## 2022-04-16 DIAGNOSIS — K219 Gastro-esophageal reflux disease without esophagitis: Secondary | ICD-10-CM

## 2022-04-16 DIAGNOSIS — K449 Diaphragmatic hernia without obstruction or gangrene: Secondary | ICD-10-CM

## 2022-04-16 MED ORDER — ESOMEPRAZOLE MAGNESIUM 20 MG PO CPDR: 20.0000 mg | DELAYED_RELEASE_CAPSULE | Freq: Two times a day (BID) | ORAL | 0 refills | Status: DC

## 2022-04-16 NOTE — Patient Instructions (Addendum)
Your Barium Swallow test is scheduled for 04/23/22 9 am (arrive by 8:45 am) at [facility]. Nothing to eat or drink 3 hours prior.    If you have any concerns or questions, please feel free to call our office.   Eating Plan After Nissen Fundoplication After a Nissen fundoplication procedure, it is common to have some difficulty swallowing. The part of your body that moves food and liquid from your mouth to your stomach (esophagus) will be swollen and may feel tight. It will take several weeks or months for your esophagus and stomach to heal. By following a special eating plan, you can prevent problems such as pain, swelling or pressure in the abdomen (bloating), gas, nausea, or diarrhea. What are tips for following this plan? Cooking Cook all foods until they are soft. Remove skins and seeds from fruits and vegetables before eating. Remove skin and gristle from meats. Grind or finely mince meats before eating. Avoid over-cooking meat. Dry, tough meat is more difficult to swallow. Avoid using oil when cooking, or use only a small amount of oil. Avoid using seasoning when cooking, or use only a small amount of seasoning. Toast bread before eating. This makes it easier to swallow. Meal planning  Eat 6-8 small meals throughout the day. Right after the surgery, have a few meals that are only clear liquids. Clear liquids include: Water. Clear fruit juice, no pulp. Chicken, beef, or vegetable broth. Gelatin. Decaffeinated tea or coffee without milk. Popsicles or shaved ice. Depending on your progress, you may move to a full liquid diet as told by your health care provider. This includes clear liquids and the following: Dairy and alternative milks, such as soy milk. Strained creamed soups. Ice cream or sherbet. Pudding. Nutritional supplement drinks. Yogurt. A few days after surgery, you may be able to start eating a diet of soft foods. You may need to eat according to this plan for  several weeks. Do not eat sweets or sweetened drinks at the beginning of a meal. Doing that may cause your stomach to empty faster than it should (dumping syndrome). Lifestyle Always sit upright when eating or drinking. Eat slowly. Take small bites and chew food well before swallowing. Do not lie down after eating. Stay sitting up for 30 minutes or longer after each meal. Sip fluids between meals. Limit how much you drink at one time. With meals and snacks, have 4-8 oz (120-240 mL). This is equal to  cup-1 cup. Do not mix solid foods and liquids in the same mouthful. Drink enough fluid to keep your urine pale yellow. Do not chew gum or drink fluids through a straw. Doing those things may cause you to swallow extra air. General information Do not drink carbonated drinks or alcohol. Avoid foods and drinks that contain caffeine and chocolate. Avoid foods and drinks that contain citrus or tomato. Allow hot soups and drinks to cool before eating. Avoid foods that cause gas, such as beans, peas, broccoli, or cabbage. If dairy milk products cause diarrhea, avoid them or eat them in small amounts. Recommended foods Fruits Any soft-cooked fruits after skins and seeds are removed. Fruit juice. Vegetables Any soft-cooked vegetables after skins and seeds are removed. Vegetable juice. Grains Cooked cereals. Dry cereals softened with liquid. Cooked pasta, rice, or other grains. Toasted bread. Bland crackers, such as soda or graham crackers. Meats and other protein foods Tender cuts of meat, poultry, or fish after bones, skin, and gristle are removed. Poached, boiled, or scrambled  eggs. Canned fish. Tofu. Creamy nut butters. Dairy Milk. Yogurt. Cottage cheese. Mild cheeses. Beverages Nutritional supplement drinks. Decaffeinated tea or coffee. Sports drinks. Fats and oils Butter. Margarine. Mayonnaise. Vegetable oil. Smooth salad dressing. Sweets and desserts Plain hard candy. Marshmallows.  Pudding. Ice cream. Gelatin. Sherbet. Seasoning and other foods Salt. Light seasonings. Mustard. Vinegar. The items listed above may not be a complete list of recommended foods and beverages. Contact a dietitian for more information. Foods to avoid Fruits Oranges. Grapefruit. Lemons. Limes. Citrus juices. Dried fruit. Crunchy, raw fruits. Vegetables Tomato sauce. Tomato juice. Broccoli. Cauliflower. Cabbage. Brussels sprouts. Crunchy, raw vegetables. Grains High-fiber or bran cereal. Cereal with nuts, dried fruit, or coconut. Sweet breads, rolls, coffee cake, or donuts. Chewy or crusty breads. Popcorn. Meats and other protein foods Beans, peas, and lentils. Tough or fatty meats. Fried meats, chicken, or fish. Fried eggs. Nuts and seeds. Crunchy nut butters. Dairy Chocolate milk. Yogurt with chunks of fruit, nuts, seeds, or coconut. Strong cheeses. Beverages Carbonated soft drinks. Alcohol. Cocoa. Hot drinks. Fats and oils Bacon fat. Lard. Sweets and desserts Chocolate. Candy with nuts, coconut, or seeds. Peppermint. Cookies. Cakes. Pie crust. Seasoning and other foods Heavy seasonings. Chili sauce. Ketchup. Barbecue sauce. Angie Fava. Horseradish. The items listed above may not be a complete list of foods and beverages to avoid. Contact a dietitian for more information. Summary Following this eating plan after a Nissen fundoplication is an important part of healing after surgery. After surgery, you will start with a clear liquid diet before you progress to full liquids and soft foods. You may need to eat soft foods for several weeks. Avoid eating foods that cause irritation, gas, nausea, diarrhea, or swelling or pressure in the abdomen (bloating), and avoid foods that are difficult to swallow. Talk with a dietitian about which dietary choices are best for you. This information is not intended to replace advice given to you by your health care provider. Make sure you discuss any questions  you have with your health care provider. Document Revised: 09/13/2019 Document Reviewed: 09/13/2019 Elsevier Patient Education  [place].

## 2022-04-19 NOTE — Progress Notes (Signed)
57 year old female following up after Nissen fundoplication and hiatal hernia repair 3 weeks ago. She is doing well but she continues to endorse reflux worsening by coffee. Has no fevers no chills and minimal pain.  PE NAD Abd: soft, patient is healing well without infection.  No peritonitis  A/P doing well after Nissen fundoplication but unfortunately no relief of reflux symptoms.  Discussed with the patient in detail about potential other etiologies of the presumed reflux.  She does not have any complications related to her surgery.  Will obtain a swallow evaluation to assess her anatomy and the wrap.  Asked her to start taking PPI again.  I will see her back after she completes her swallow study.

## 2022-04-23 ENCOUNTER — Other Ambulatory Visit: Payer: Medicare PPO

## 2022-04-30 ENCOUNTER — Encounter: Payer: Medicare PPO | Admitting: Surgery

## 2022-05-25 ENCOUNTER — Other Ambulatory Visit: Payer: Self-pay | Admitting: Family Medicine

## 2022-05-25 DIAGNOSIS — F5104 Psychophysiologic insomnia: Secondary | ICD-10-CM

## 2022-07-19 ENCOUNTER — Ambulatory Visit (INDEPENDENT_AMBULATORY_CARE_PROVIDER_SITE_OTHER): Payer: Medicare PPO

## 2022-07-19 VITALS — Ht 68.0 in | Wt 164.0 lb

## 2022-07-19 DIAGNOSIS — Z0000 Encounter for general adult medical examination without abnormal findings: Secondary | ICD-10-CM

## 2022-07-19 NOTE — Patient Instructions (Signed)
[name] , Thank you for taking time to come for your Medicare Wellness Visit. I appreciate your ongoing commitment to your health goals. Please review the following plan we discussed and let me know if I can assist you in the future.   These are the goals we discussed:  Goals      DIET - INCREASE WATER INTAKE     Recommend to drink at least 6-8 8oz glasses of water per day.     Weight (lb) < 155 lb (70.3 kg)     Pt states she would like to lose 15 pounds over the next year        This is a list of the screening recommended for you and due dates:  Health Maintenance  Topic Date Due   Zoster (Shingles) Vaccine (1 of 2) Never done   Screening for Lung Cancer  04/18/2021   DTaP/Tdap/Td vaccine (2 - Td or Tdap) 10/09/2021   COVID-19 Vaccine (4 - 2023-24 season) 11/10/2021   Flu Shot  10/11/2022   Mammogram  01/04/2023   Medicare Annual Wellness Visit  07/19/2023   DEXA scan (bone density measurement)  01/04/2024   Colon Cancer Screening  04/21/2024   Hepatitis C Screening: USPSTF Recommendation to screen - Ages 18-79 yo.  Completed   HPV Vaccine  Aged Out   HIV Screening  Discontinued    Advanced directives: yes  Conditions/risks identified: low falls risk  Next appointment: Follow up in one year for your annual wellness visit. 07/25/2023 @11 :30am telephone  Preventive Care 40-64 Years, Female Preventive care refers to lifestyle choices and visits with your health care provider that can promote health and wellness. What does preventive care include? A yearly physical exam. This is also called an annual well check. Dental exams once or twice a year. Routine eye exams. Ask your health care provider how often you should have your eyes checked. Personal lifestyle choices, including: Daily care of your teeth and gums. Regular physical activity. Eating a healthy diet. Avoiding tobacco and drug use. Limiting alcohol use. Practicing safe sex. Taking low-dose aspirin daily  starting at age 28. Taking vitamin and mineral supplements as recommended by your health care provider. What happens during an annual well check? The services and screenings done by your health care provider during your annual well check will depend on your age, overall health, lifestyle risk factors, and family history of disease. Counseling  Your health care provider may ask you questions about your: Alcohol use. Tobacco use. Drug use. Emotional well-being. Home and relationship well-being. Sexual activity. Eating habits. Work and work [employer]. Method of birth control. Menstrual cycle. Pregnancy history. Screening  You may have the following tests or measurements: Height, weight, and BMI. Blood pressure. Lipid and cholesterol levels. These may be checked every 5 years, or more frequently if you are over 39 years old. Skin check. Lung cancer screening. You may have this screening every year starting at age 39 if you have a 30-pack-year history of smoking and currently smoke or have quit within the past 15 years. Fecal occult blood test (FOBT) of the stool. You may have this test every year starting at age 78. Flexible sigmoidoscopy or colonoscopy. You may have a sigmoidoscopy every 5 years or a colonoscopy every 10 years starting at age 34. Hepatitis C blood test. Hepatitis B blood test. Sexually transmitted disease (STD) testing. Diabetes screening. This is done by checking your blood sugar (glucose) after you have not eaten for a while (fasting).  You may have this done every 1-3 years. Mammogram. This may be done every 1-2 years. Talk to your health care provider about when you should start having regular mammograms. This may depend on whether you have a family history of breast cancer. BRCA-related cancer screening. This may be done if you have a family history of breast, ovarian, tubal, or peritoneal cancers. Pelvic exam and Pap test. This may be done every 3 years starting  at age 36. Starting at age 14, this may be done every 5 years if you have a Pap test in combination with an HPV test. Bone density scan. This is done to screen for osteoporosis. You may have this scan if you are at high risk for osteoporosis. Discuss your test results, treatment options, and if necessary, the need for more tests with your health care provider. Vaccines  Your health care provider may recommend certain vaccines, such as: Influenza vaccine. This is recommended every year. Tetanus, diphtheria, and acellular pertussis (Tdap, Td) vaccine. You may need a Td booster every 10 years. Zoster vaccine. You may need this after age 69. Pneumococcal 13-valent conjugate (PCV13) vaccine. You may need this if you have certain conditions and were not previously vaccinated. Pneumococcal polysaccharide (PPSV23) vaccine. You may need one or two doses if you smoke cigarettes or if you have certain conditions. Talk to your health care provider about which screenings and vaccines you need and how often you need them. This information is not intended to replace advice given to you by your health care provider. Make sure you discuss any questions you have with your health care provider. Document Released: 03/25/2015 Document Revised: 11/16/2015 Document Reviewed: 12/28/2014 Elsevier Interactive Patient Education  2017 [employer].    Fall Prevention in the Home Falls can cause injuries. They can happen to people of all ages. There are many things you can do to make your home safe and to help prevent falls. What can I do on the outside of my home? Regularly fix the edges of walkways and driveways and fix any cracks. Remove anything that might make you trip as you walk through a door, such as a raised step or threshold. Trim any bushes or trees on the path to your home. Use bright outdoor lighting. Clear any walking paths of anything that might make someone trip, such as rocks or tools. Regularly check  to see if handrails are loose or broken. Make sure that both sides of any steps have handrails. Any raised decks and porches should have guardrails on the edges. Have any leaves, snow, or ice cleared regularly. Use sand or salt on walking paths during winter. Clean up any spills in your garage right away. This includes oil or grease spills. What can I do in the bathroom? Use night lights. Install grab bars by the toilet and in the tub and shower. Do not use towel bars as grab bars. Use non-skid mats or decals in the tub or shower. If you need to sit down in the shower, use a plastic, non-slip stool. Keep the floor dry. Clean up any water that spills on the floor as soon as it happens. Remove soap buildup in the tub or shower regularly. Attach bath mats securely with double-sided non-slip rug tape. Do not have throw rugs and other things on the floor that can make you trip. What can I do in the bedroom? Use night lights. Make sure that you have a light by your bed that is easy to reach.  Do not use any sheets or blankets that are too big for your bed. They should not hang down onto the floor. Have a firm chair that has side arms. You can use this for support while you get dressed. Do not have throw rugs and other things on the floor that can make you trip. What can I do in the kitchen? Clean up any spills right away. Avoid walking on wet floors. Keep items that you use a lot in easy-to-reach places. If you need to reach something above you, use a strong step stool that has a grab bar. Keep electrical cords out of the way. Do not use floor polish or wax that makes floors slippery. If you must use wax, use non-skid floor wax. Do not have throw rugs and other things on the floor that can make you trip. What can I do with my stairs? Do not leave any items on the stairs. Make sure that there are handrails on both sides of the stairs and use them. Fix handrails that are broken or loose. Make  sure that handrails are as long as the stairways. Check any carpeting to make sure that it is firmly attached to the stairs. Fix any carpet that is loose or worn. Avoid having throw rugs at the top or bottom of the stairs. If you do have throw rugs, attach them to the floor with carpet tape. Make sure that you have a light switch at the top of the stairs and the bottom of the stairs. If you do not have them, ask someone to add them for you. What else can I do to help prevent falls? Wear shoes that: Do not have high heels. Have rubber bottoms. Are comfortable and fit you well. Are closed at the toe. Do not wear sandals. If you use a stepladder: Make sure that it is fully opened. Do not climb a closed stepladder. Make sure that both sides of the stepladder are locked into place. Ask someone to hold it for you, if possible. Clearly mark and make sure that you can see: Any grab bars or handrails. First and last steps. Where the edge of each step is. Use tools that help you move around (mobility aids) if they are needed. These include: Canes. Walkers. Scooters. Crutches. Turn on the lights when you go into a dark area. Replace any light bulbs as soon as they burn out. Set up your furniture so you have a clear path. Avoid moving your furniture around. If any of your floors are uneven, fix them. If there are any pets around you, be aware of where they are. Review your medicines with your doctor. Some medicines can make you feel dizzy. This can increase your chance of falling. Ask your doctor what other things that you can do to help prevent falls. This information is not intended to replace advice given to you by your health care provider. Make sure you discuss any questions you have with your health care provider. Document Released: 12/23/2008 Document Revised: 08/04/2015 Document Reviewed: 04/02/2014 Elsevier Interactive Patient Education  2017 [employer].

## 2022-07-19 NOTE — Progress Notes (Signed)
I connected with  Tara [name] on 07/19/22 by a audio enabled telemedicine application and verified that I am speaking with the correct person using two identifiers.  Patient Location: Home  Provider Location: Office/Clinic  I discussed the limitations of evaluation and management by telemedicine. The patient expressed understanding and agreed to proceed.  Subjective:   Tara [name] is a 57 y.o. female who presents for Medicare Annual (Subsequent) preventive examination.  Review of Systems          Objective:    Today's Vitals   07/18/22 1245 07/19/22 1127  Weight:  164 lb (74.4 kg)  Height:  5\' 8"  (1.727 m)  PainSc: 3     Body mass index is 24.94 kg/m.     07/19/2022   11:35 AM 03/27/2022    6:16 AM 03/21/2022    3:46 PM 12/21/2021    8:13 AM 07/27/2021    6:15 AM 07/19/2021    2:20 PM 07/13/2021   11:24 AM  Advanced Directives  Does Patient Have a Medical Advance Directive? Yes Yes No No No No No  Type of Advance Directive  Living will       Does patient want to make changes to medical advance directive?  No - Patient declined       Would patient like information on creating a medical advance directive?      Yes (MAU/Ambulatory/Procedural Areas - Information given) Yes (MAU/Ambulatory/Procedural Areas - Information given)    Current Medications (verified) Outpatient Encounter Medications as of 07/19/2022  Medication Sig   CALCIUM PO Take 1 tablet by mouth 2 (two) times daily.   Eszopiclone 3 MG TABS TAKE 1 TABLET(3 MG) BY MOUTH AT BEDTIME   Fluticasone-Umeclidin-Vilant (TRELEGY ELLIPTA) 100-62.5-25 MCG/ACT AEPB Inhale 1 puff into the lungs daily. (Patient taking differently: Inhale 1 puff into the lungs as needed.)   rosuvastatin (CRESTOR) 10 MG tablet Take 1 tablet (10 mg total) by mouth daily. (Patient taking differently: Take 10 mg by mouth every morning.)   venlafaxine XR (EFFEXOR-XR) 150 MG 24 hr capsule TAKE 1 CAPSULE(150 MG) BY MOUTH DAILY WITH BREAKFAST  (Patient taking differently: 150 mg daily with breakfast. TAKE 1 CAPSULE(150 MG) BY MOUTH DAILY WITH BREAKFAST)   VITAMIN D PO Take 1 tablet by mouth 2 (two) times daily.   esomeprazole (NEXIUM) 20 MG capsule Take 1 capsule (20 mg total) by mouth 2 (two) times daily before a meal.   famotidine (PEPCID) 20 MG tablet Take 1 tablet (20 mg total) by mouth 2 (two) times daily.   ondansetron (ZOFRAN-ODT) 4 MG disintegrating tablet Take 1 tablet (4 mg total) by mouth every 6 (six) hours as needed for nausea.   No facility-administered encounter medications on file as of 07/19/2022.    Allergies (verified) Aspirin and Clonidine derivatives   History: Past Medical History:  Diagnosis Date   Anxiety    Arthritis    arthritis in back   Barrett's esophagus    COPD (chronic obstructive pulmonary disease) (HCC)    CPRS 1 (complex regional pain syndrome I) of upper limb 2007   formerly called rsd   Depression    being treated for depression   Fatty liver    GERD (gastroesophageal reflux disease)    H/O degenerative disc disease    chronic back pain   Headache(784.0)    headaches due to pain   Neuromuscular disorder (HCC)    rsd   Pneumonia    2011 - hospitalized for 4 days  Reflex sympathetic dystrophy    Tobacco abuse    Past Surgical History:  Procedure Laterality Date   ABDOMINAL EXPOSURE N/A 07/27/2021   Procedure: ABDOMINAL EXPOSURE FOR ANTERIOR SPINE SURGERY;  Surgeon: Leonie Douglas, MD;  Location: [facility] OR;  Service: Vascular;  Laterality: N/A;   ABDOMINAL HYSTERECTOMY  2005   partial   ANTERIOR AND POSTERIOR SPINAL FUSION N/A 07/27/2021   Procedure: ANTERIOR LUMBAR INTERBODY FUSION AND POSTERIOR SPINAL FUSION WITH PEDICLE SCREWS LUMBAR FIVE TO SACRAL ONE;  Surgeon: Venita Lick, MD;  Location: [facility] OR;  Service: Orthopedics;  Laterality: N/A;  4.5 hrs Dr. Lenell Antu to do approach left tap block with exparel 3 C-Bed   CERVICAL DISC ARTHROPLASTY  2008   ESOPHAGOGASTRODUODENOSCOPY  (EGD) WITH PROPOFOL N/A 06/04/2017   Procedure: ESOPHAGOGASTRODUODENOSCOPY (EGD) WITH PROPOFOL;  Surgeon: Christena Deem, MD;  Location: [facility] ENDOSCOPY;  Service: Endoscopy;  Laterality: N/A;   ESOPHAGOGASTRODUODENOSCOPY (EGD) WITH PROPOFOL N/A 12/21/2021   Procedure: ESOPHAGOGASTRODUODENOSCOPY (EGD) WITH PROPOFOL;  Surgeon: Toney Reil, MD;  Location: [facility] ENDOSCOPY;  Service: Gastroenterology;  Laterality: N/A;   INSERTION OF MESH  03/27/2022   Procedure: INSERTION OF MESH;  Surgeon: Leafy Ro, MD;  Location: [facility] ORS;  Service: General;;   KNEE ARTHROSCOPY Right 2008   LUMBAR LAMINECTOMY/DECOMPRESSION MICRODISCECTOMY  01/25/2011   Procedure: LUMBAR LAMINECTOMY/DECOMPRESSION MICRODISCECTOMY;  Surgeon: Alvy Beal;  Location: [facility] OR;  Service: Orthopedics;  Laterality: Left;  LEFT L5-S1 MICRODISCECTOMY, Central Decompression Lumbar five-sacral one   OOPHORECTOMY Left 2011   OOPHORECTOMY Right 10/2012   SPINAL CORD STIMULATOR IMPLANT  2009   Removal-01/2012   SPINAL CORD STIMULATOR REMOVAL  01/17/2012   Procedure: LUMBAR SPINAL CORD STIMULATOR REMOVAL;  Surgeon: Venita Lick, MD;  Location: [facility] OR;  Service: Orthopedics;  Laterality: N/A;  Spinal Cord Battery Removal   TUBAL LIGATION  1988   XI ROBOTIC ASSISTED PARAESOPHAGEAL HERNIA REPAIR N/A 03/27/2022   Procedure: XI ROBOTIC ASSISTED PARAESOPHAGEAL HERNIA REPAIR, RNFA to assist;  Surgeon: Leafy Ro, MD;  Location: [facility] ORS;  Service: General;  Laterality: N/A;   Family History  Problem Relation Age of Onset   Depression Mother    Ovarian cancer Mother    Hypertension Mother    Hypercholesterolemia Mother    Obesity Mother    Hypertension Sister    Obesity Sister    Stroke Sister    Healthy Sister    Healthy Daughter    Tongue cancer Daughter    Healthy Daughter    Breast cancer Neg Hx    Social History   Socioeconomic History   Marital status: Divorced    Spouse name: Not on file   Number of children: 2    Years of education: Not on file   Highest education level: 12th grade  Occupational History    Employer: UNEMPLOYED    Employer: DISABLED  Tobacco Use   Smoking status: Former    Packs/day: 1.00    Years: 30.00    Additional pack years: 0.00    Total pack years: 30.00    Types: Cigarettes    Start date: 09/10/1986    Quit date: 2018    Years since quitting: 6.3   Smokeless tobacco: Never  Vaping Use   Vaping Use: Every day   Start date: 12/12/2016   Substances: Nicotine  Substance and Sexual Activity   Alcohol use: No    Alcohol/week: 0.0 standard drinks of alcohol   Drug use: No   Sexual activity: Yes  Partners: Male    Birth control/protection: Post-menopausal  Other Topics Concern   Not on file  Social History Narrative   Lives with long term boyfriend   Social Determinants of Health   Financial Resource Strain: Low Risk  (07/18/2022)   Overall Financial Resource Strain (CARDIA)    Difficulty of Paying Living Expenses: Not very hard  Food Insecurity: No Food Insecurity (07/18/2022)   Hunger Vital Sign    Worried About Running Out of Food in the Last Year: Never true    Ran Out of Food in the Last Year: Never true  Transportation Needs: No Transportation Needs (07/18/2022)   PRAPARE - Administrator, Civil Service (Medical): No    Lack of Transportation (Non-Medical): No  Physical Activity: Insufficiently Active (07/18/2022)   Exercise Vital Sign    Days of Exercise per Week: 3 days    Minutes of Exercise per Session: 30 min  Stress: No Stress Concern Present (07/18/2022)   [employer] of Occupational Health - Occupational Stress Questionnaire    Feeling of Stress : Not at all  Social Connections: Unknown (07/18/2022)   Social Connection and Isolation Panel [NHANES]    Frequency of Communication with Friends and Family: Twice a week    Frequency of Social Gatherings with Friends and Family: Twice a week    Attends Religious Services: Not on Environmental health practitioner or Organizations: Patient declined    Attends Banker Meetings: Never    Marital Status: Living with partner    Tobacco Counseling Counseling given: Not Answered  Clinical Intake:  Pre-visit preparation completed: Yes  Pain : 0-10 Pain Score: 3  Pain Type: Chronic pain Pain Location: Back (both knees) Pain Orientation: Lower Pain Descriptors / Indicators: Aching Pain Onset: More than a month ago Pain Frequency: Intermittent Pain Relieving Factors: voltaren,walking  Pain Relieving Factors: voltaren,walking  BMI - recorded: 24.94 Nutritional Status: BMI of 19-24  Normal Nutritional Risks: None Diabetes: No  How often do you need to have someone help you when you read instructions, pamphlets, or other written materials from your doctor or pharmacy?: (P) 1 - Never  Diabetic?no  Interpreter Needed?: No  Comments: lives with partner Information entered by :: B.Trevon Strothers,LPN   Activities of Daily Living    07/18/2022   12:45 PM 03/27/2022   11:00 AM  In your present state of health, do you have any difficulty performing the following activities:  Hearing? 0 0  Vision? 0 0  Difficulty concentrating or making decisions? 0 0  Walking or climbing stairs? 0 0  Dressing or bathing? 0 0  Doing errands, shopping? 0 0  Preparing Food and eating ? N   Using the Toilet? N   In the past six months, have you accidently leaked urine? N   Do you have problems with loss of bowel control? N   Managing your Medications? N   Managing your Finances? N   Housekeeping or managing your Housekeeping? N     Patient Care Team: Alba Cory, MD as PCP - General Wyline Mood, MD as Consulting Physician (Gastroenterology) Venita Lick, MD as Consulting Physician (Orthopedic Surgery)  Indicate any recent Medical Services you may have received from other than [facility] providers in the past year (date may be approximate).     Assessment:   This is a  routine wellness examination for Tara [name].  Hearing/Vision screen Hearing Screening - Comments:: Adequate hearing Vision Screening - Comments:: Adequate vision;readers  only Dr. Clydene Pugh  Dietary issues and exercise activities discussed:     Goals Addressed             This Visit's Progress    DIET - INCREASE WATER INTAKE   On track    Recommend to drink at least 6-8 8oz glasses of water per day.       Depression Screen    07/19/2022   11:33 AM 02/15/2022   11:16 AM 11/10/2021    2:23 PM 07/24/2021    1:29 PM 07/13/2021   11:23 AM 06/22/2021    2:56 PM 01/23/2021    1:38 PM  PHQ 2/9 Scores  PHQ - 2 Score 0 0 0 4 6 6  0  PHQ- 9 Score  3 0 12 16 13  0    Fall Risk    07/18/2022   12:45 PM 02/15/2022   11:16 AM 11/10/2021    2:23 PM 07/24/2021    1:22 PM 07/13/2021   11:24 AM  Fall Risk   Falls in the past year? 0 0 0 0 0  Number falls in past yr: 0 0 0  0  Injury with Fall? 0 0 0  0  Risk for fall due to :  No Fall Risks No Fall Risks No Fall Risks No Fall Risks  Follow up  Falls prevention discussed Falls prevention discussed Falls prevention discussed Falls prevention discussed    FALL RISK PREVENTION PERTAINING TO THE HOME:  Any stairs in or around the home? Yes  If so, are there any without handrails? Yes  Home free of loose throw rugs in walkways, pet beds, electrical cords, etc? Yes  Adequate lighting in your home to reduce risk of falls? Yes   ASSISTIVE DEVICES UTILIZED TO PREVENT FALLS:  Life alert? No  Use of a cane, walker or w/c? No  Grab bars in the bathroom? Yes  Shower chair or bench in shower? No  Elevated toilet seat or a handicapped toilet? Yes   Cognitive Function:        05/20/2018   10:56 AM 05/17/2017   11:05 AM  6CIT Screen  What Year? 0 points 0 points  What month? 0 points 0 points  What time? 0 points 0 points  Count back from 20 0 points 0 points  Months in reverse 0 points 0 points  Repeat phrase 0 points 0 points  Total Score 0 points  0 points    Immunizations Immunization History  Administered Date(s) Administered   Influenza Split 11/29/2011   Influenza, Seasonal, Injecte, Preservative Fre 01/10/2011   Influenza,inj,Quad PF,6+ Mos 12/10/2012, 02/17/2014, 01/18/2015, 01/17/2016, 11/28/2016, 02/11/2018, 01/23/2021, 11/10/2021   Influenza-Unspecified 02/17/2014   Moderna Sars-Covid-2 Vaccination 06/01/2019, 06/29/2019, 02/02/2020   Pneumococcal Conjugate-13 07/26/2015   Pneumococcal Polysaccharide-23 01/10/2010   Tdap 10/10/2011    TDAP status: Up to date  Flu Vaccine status: Up to date  Pneumococcal vaccine status: Up to date  Covid-19 vaccine status: Completed vaccines  Qualifies for Shingles Vaccine? Yes   Zostavax completed No   Shingrix Completed?: No.    Education has been provided regarding the importance of this vaccine. Patient has been advised to call insurance company to determine out of pocket expense if they have not yet received this vaccine. Advised may also receive vaccine at local pharmacy or Health Dept. Verbalized acceptance and understanding.  Screening Tests Health Maintenance  Topic Date Due   Zoster Vaccines- Shingrix (1 of 2) Never done   Lung Cancer Screening  04/18/2021  DTaP/Tdap/Td (2 - Td or Tdap) 10/09/2021   COVID-19 Vaccine (4 - 2023-24 season) 11/10/2021   INFLUENZA VACCINE  10/11/2022   MAMMOGRAM  01/04/2023   Medicare Annual Wellness (AWV)  07/19/2023   DEXA SCAN  01/04/2024   COLONOSCOPY (Pts 45-56yrs Insurance coverage will need to be confirmed)  04/21/2024   Hepatitis C Screening  Completed   HPV VACCINES  Aged Out   HIV Screening  Discontinued    Health Maintenance  Health Maintenance Due  Topic Date Due   Zoster Vaccines- Shingrix (1 of 2) Never done   Lung Cancer Screening  04/18/2021   DTaP/Tdap/Td (2 - Td or Tdap) 10/09/2021   COVID-19 Vaccine (4 - 2023-24 season) 11/10/2021    Colorectal cancer screening: Type of screening: Colonoscopy. Completed  yes. Repeat every 10 years  Mammogram status: Completed yes. Repeat every year  Lung Cancer Screening: (Low Dose CT Chest recommended if Age 82-80 years, 30 pack-year currently smoking OR have quit w/in 15years.) does not qualify.   Lung Cancer Screening Referral: no  Additional Screening:  Hepatitis C Screening: does not qualify; Completed yes  Vision Screening: Recommended annual ophthalmology exams for early detection of glaucoma and other disorders of the eye. Is the patient up to date with their annual eye exam?  Yes  Who is the provider or what is the name of the office in which the patient attends annual eye exams? Dr Clydene Pugh If pt is not established with a provider, would they like to be referred to a provider to establish care? No .   Dental Screening: Recommended annual dental exams for proper oral hygiene  Community Resource Referral / Chronic Care Management: CRR required this visit?  No   CCM required this visit?  No     Plan:     I have personally reviewed and noted the following in the patient's chart:   Medical and social history Use of alcohol, tobacco or illicit drugs  Current medications and supplements including opioid prescriptions. Patient is not currently taking opioid prescriptions. Functional ability and status Nutritional status Physical activity Advanced directives List of other physicians Hospitalizations, surgeries, and ER visits in previous 12 months Vitals Screenings to include cognitive, depression, and falls Referrals and appointments  In addition, I have reviewed and discussed with patient certain preventive protocols, quality metrics, and best practice recommendations. A written personalized care plan for preventive services as well as general preventive health recommendations were provided to patient.     Sue Lush, LPN   4/0/9811   Nurse Notes: Pt states she is doing better managing pain. She does want to discuss steroid  injections for her knee to have more mobility/less pain.

## 2022-07-30 ENCOUNTER — Other Ambulatory Visit: Payer: Self-pay | Admitting: Family Medicine

## 2022-07-30 DIAGNOSIS — I700 Atherosclerosis of aorta: Secondary | ICD-10-CM

## 2022-08-08 ENCOUNTER — Ambulatory Visit: Payer: Self-pay | Admitting: *Deleted

## 2022-08-08 NOTE — Telephone Encounter (Signed)
Summary: diarrhea, sores in mouth   Pt states that she keep having diarrhea and she keep having sores pop up in her mouth every two weeks and last for 4 or 5 days. Please advise.      Chief Complaint: Diarrhea Symptoms: States episodes of loose, at times watery ,stools x 2-3 weeks. Does not occur daily, 2 episodes this AM. States "Seems to have started after my acid reflux surgery in March."  Frequency: 2-3 weeks Pertinent Negatives: Patient denies pain, fever, antibiotics Disposition: [] ED /[] Urgent Care (no appt availability in office) / [] Appointment(In office/virtual)/ []  [place]/ [] Home Care/ [] Refused Recommended Disposition /[] [place] [facility]/ [x]  Follow-up with PCP Additional Notes:  Pt also reports recurring ulcer tip of tongue, resolves with Oral gel, then returns.Pt has appt 08/20/22 already scheduled.  Pt requesting earlier appt, only with Dr. Carlynn Purl, placed on wait list.  No availability. Assured pt NT would route to practice for PCPs review and final disposition. Care advise provided, pt verbalizes understanding. Reason for Disposition  [1] Mild diarrhea (e.g., 1-3 or more stools than normal in past 24 hours) without known cause AND [2] present >  7 days  Answer Assessment - Initial Assessment Questions 1. DIARRHEA SEVERITY: "How bad is the diarrhea?" "How many more stools have you had in the past 24 hours than normal?"    - NO DIARRHEA (SCALE 0)   - MILD (SCALE 1-3): Few loose or mushy BMs; increase of 1-3 stools over normal daily number of stools; mild increase in ostomy output.   -  MODERATE (SCALE 4-7): Increase of 4-6 stools daily over normal; moderate increase in ostomy output.   -  SEVERE (SCALE 8-10; OR "WORST POSSIBLE"): Increase of 7 or more stools daily over normal; moderate increase in ostomy output; incontinence.     Not daily 2. ONSET: "When did the diarrhea begin?"      2-3 weeks 3. BM CONSISTENCY: "How loose or watery is the diarrhea?"       Both, varies 4. VOMITING: "Are you also vomiting?" If Yes, ask: "How many times in the past 24 hours?"      No 5. ABDOMEN PAIN: "Are you having any abdomen pain?" If Yes, ask: "What does it feel like?" (e.g., crampy, dull, intermittent, constant)      No 6. ABDOMEN PAIN SEVERITY: If present, ask: "How bad is the pain?"  (e.g., Scale 1-10; mild, moderate, or severe)   - MILD (1-3): doesn't interfere with normal activities, abdomen soft and not tender to touch    - MODERATE (4-7): interferes with normal activities or awakens from sleep, abdomen tender to touch    - SEVERE (8-10): excruciating pain, doubled over, unable to do any normal activities       NA 7. ORAL INTAKE: If vomiting, "Have you been able to drink liquids?" "How much liquids have you had in the past 24 hours?"     NA 8. HYDRATION: "Any signs of dehydration?" (e.g., dry mouth [not just dry lips], too weak to stand, dizziness, new weight loss) "When did you last urinate?"     no  10. ANTIBIOTIC USE: "Are you taking antibiotics now or have you taken antibiotics in the past 2 months?"       No 11. OTHER SYMPTOMS: "Do you have any other symptoms?" (e.g., fever, blood in stool)       No  Protocols used: Diarrhea-A-AH
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
## 2022-08-20 ENCOUNTER — Ambulatory Visit: Payer: Medicare PPO | Admitting: Family Medicine

## 2022-08-20 ENCOUNTER — Encounter: Payer: Self-pay | Admitting: Family Medicine

## 2022-08-20 VITALS — BP 126/70 | HR 91 | Temp 98.3°F | Resp 16 | Ht 68.0 in | Wt 163.5 lb

## 2022-08-20 DIAGNOSIS — F325 Major depressive disorder, single episode, in full remission: Secondary | ICD-10-CM

## 2022-08-20 DIAGNOSIS — M8580 Other specified disorders of bone density and structure, unspecified site: Secondary | ICD-10-CM

## 2022-08-20 DIAGNOSIS — D692 Other nonthrombocytopenic purpura: Secondary | ICD-10-CM

## 2022-08-20 DIAGNOSIS — J432 Centrilobular emphysema: Secondary | ICD-10-CM | POA: Diagnosis not present

## 2022-08-20 DIAGNOSIS — Z780 Asymptomatic menopausal state: Secondary | ICD-10-CM

## 2022-08-20 DIAGNOSIS — Z131 Encounter for screening for diabetes mellitus: Secondary | ICD-10-CM

## 2022-08-20 DIAGNOSIS — R197 Diarrhea, unspecified: Secondary | ICD-10-CM

## 2022-08-20 DIAGNOSIS — Z23 Encounter for immunization: Secondary | ICD-10-CM

## 2022-08-20 DIAGNOSIS — M25551 Pain in right hip: Secondary | ICD-10-CM | POA: Diagnosis not present

## 2022-08-20 DIAGNOSIS — I700 Atherosclerosis of aorta: Secondary | ICD-10-CM

## 2022-08-20 DIAGNOSIS — F5104 Psychophysiologic insomnia: Secondary | ICD-10-CM | POA: Diagnosis not present

## 2022-08-20 DIAGNOSIS — D649 Anemia, unspecified: Secondary | ICD-10-CM | POA: Diagnosis not present

## 2022-08-20 MED ORDER — VENLAFAXINE HCL ER 150 MG PO CP24: 150.0000 mg | ORAL_CAPSULE | Freq: Every day | ORAL | 1 refills | Status: DC

## 2022-08-20 MED ORDER — ESZOPICLONE 3 MG PO TABS: 3.0000 mg | ORAL_TABLET | Freq: Every day | ORAL | 1 refills | Status: DC

## 2022-08-20 MED ORDER — TRELEGY ELLIPTA 100-62.5-25 MCG/ACT IN AEPB: 1.0000 | INHALATION_SPRAY | Freq: Every day | RESPIRATORY_TRACT | 5 refills | Status: DC

## 2022-08-21 ENCOUNTER — Other Ambulatory Visit: Payer: Self-pay

## 2022-08-21 DIAGNOSIS — I700 Atherosclerosis of aorta: Secondary | ICD-10-CM | POA: Diagnosis not present

## 2022-08-21 DIAGNOSIS — Z131 Encounter for screening for diabetes mellitus: Secondary | ICD-10-CM | POA: Diagnosis not present

## 2022-08-21 DIAGNOSIS — D649 Anemia, unspecified: Secondary | ICD-10-CM | POA: Diagnosis not present

## 2022-08-21 DIAGNOSIS — R197 Diarrhea, unspecified: Secondary | ICD-10-CM | POA: Diagnosis not present

## 2022-08-21 DIAGNOSIS — J432 Centrilobular emphysema: Secondary | ICD-10-CM | POA: Diagnosis not present

## 2022-08-21 DIAGNOSIS — D692 Other nonthrombocytopenic purpura: Secondary | ICD-10-CM | POA: Diagnosis not present

## 2022-08-21 DIAGNOSIS — J449 Chronic obstructive pulmonary disease, unspecified: Secondary | ICD-10-CM | POA: Diagnosis not present

## 2022-08-21 DIAGNOSIS — G8929 Other chronic pain: Secondary | ICD-10-CM | POA: Diagnosis not present

## 2022-08-21 DIAGNOSIS — M8580 Other specified disorders of bone density and structure, unspecified site: Secondary | ICD-10-CM | POA: Diagnosis not present

## 2022-08-21 NOTE — Addendum Note (Signed)
Addended by: Dollene Primrose on: 08/21/2022 11:58 AM   Modules accepted: Orders

## 2022-08-22 LAB — COMPLETE METABOLIC PANEL WITH GFR
AG Ratio: 1.4 (calc) (ref 1.0–2.5)
ALT: 17 U/L (ref 6–29)
AST: 16 U/L (ref 10–35)
Albumin: 4.1 g/dL (ref 3.6–5.1)
Alkaline phosphatase (APISO): 75 U/L (ref 37–153)
BUN: 9 mg/dL (ref 7–25)
CO2: 29 mmol/L (ref 20–32)
Calcium: 9.7 mg/dL (ref 8.6–10.4)
Chloride: 97 mmol/L — ABNORMAL LOW (ref 98–110)
Creat: 0.72 mg/dL (ref 0.50–1.03)
Globulin: 3 g/dL (calc) (ref 1.9–3.7)
Glucose, Bld: 80 mg/dL (ref 65–99)
Potassium: 5.1 mmol/L (ref 3.5–5.3)
Sodium: 132 mmol/L — ABNORMAL LOW (ref 135–146)
Total Bilirubin: 0.4 mg/dL (ref 0.2–1.2)
Total Protein: 7.1 g/dL (ref 6.1–8.1)
eGFR: 97 mL/min/{1.73_m2} (ref >=60)

## 2022-08-22 LAB — IRON,TIBC AND FERRITIN PANEL
%SAT: 35 % (calc) (ref 16–45)
Ferritin: 34 ng/mL (ref 16–232)
Iron: 106 ug/dL (ref 45–160)
TIBC: 299 mcg/dL (calc) (ref 250–450)

## 2022-08-22 LAB — CBC WITH DIFFERENTIAL/PLATELET
Absolute Monocytes: 743 cells/uL (ref 200–950)
Basophils Absolute: 83 cells/uL (ref 0–200)
Basophils Relative: 1.1 %
Eosinophils Absolute: 945 cells/uL — ABNORMAL HIGH (ref 15–500)
Eosinophils Relative: 12.6 %
HCT: 40.2 % (ref 35.0–45.0)
Hemoglobin: 13.2 g/dL (ref 11.7–15.5)
Lymphs Abs: 2265 cells/uL (ref 850–3900)
MCH: 31.3 pg (ref 27.0–33.0)
MCHC: 32.8 g/dL (ref 32.0–36.0)
MCV: 95.3 fL (ref 80.0–100.0)
MPV: 9.5 fL (ref 7.5–12.5)
Monocytes Relative: 9.9 %
Neutro Abs: 3465 cells/uL (ref 1500–7800)
Neutrophils Relative %: 46.2 %
Platelets: 350 10*3/uL (ref 140–400)
RBC: 4.22 10*6/uL (ref 3.80–5.10)
RDW: 12.1 % (ref 11.0–15.0)
Total Lymphocyte: 30.2 %
WBC: 7.5 10*3/uL (ref 3.8–10.8)

## 2022-08-22 LAB — VITAMIN D 25 HYDROXY (VIT D DEFICIENCY, FRACTURES): Vit D, 25-Hydroxy: 49 ng/mL (ref 30–100)

## 2022-08-22 LAB — TSH: TSH: 1.31 mIU/L (ref 0.40–4.50)

## 2022-08-22 LAB — B12 AND FOLATE PANEL
Folate: >24 ng/mL
Vitamin B-12: 768 pg/mL (ref 200–1100)

## 2022-08-22 LAB — HEMOGLOBIN A1C
Hgb A1c MFr Bld: 5.7 % of total Hgb — ABNORMAL HIGH (ref <5.7)
Mean Plasma Glucose: 117 mg/dL
eAG (mmol/L): 6.5 mmol/L

## 2022-08-25 ENCOUNTER — Encounter: Payer: Self-pay | Admitting: Family Medicine

## 2022-08-27 ENCOUNTER — Telehealth: Payer: Self-pay | Admitting: Family Medicine

## 2022-08-27 NOTE — Telephone Encounter (Signed)
Left note for Verlon Au to inquire for a second kit. Once I get response I will call patient.

## 2022-08-27 NOTE — Telephone Encounter (Signed)
FYI: per Verlon Au she is able to give a second kit. Called patient and notified. She stated she will be by in the morning to grab it. Once patient is here Verlon Au will put kit together to give her everything she needs.

## 2022-08-27 NOTE — Telephone Encounter (Signed)
Pt is calling to request another stool sample kit. She messed the previous one up. Please advise when ready and available. CB- [phone]

## 2022-08-28 ENCOUNTER — Other Ambulatory Visit: Payer: Self-pay | Admitting: Emergency Medicine

## 2022-08-28 DIAGNOSIS — Z87891 Personal history of nicotine dependence: Secondary | ICD-10-CM

## 2022-08-28 DIAGNOSIS — Z122 Encounter for screening for malignant neoplasm of respiratory organs: Secondary | ICD-10-CM

## 2022-08-29 DIAGNOSIS — M545 Low back pain, unspecified: Secondary | ICD-10-CM | POA: Diagnosis not present

## 2022-08-29 DIAGNOSIS — M7061 Trochanteric bursitis, right hip: Secondary | ICD-10-CM | POA: Diagnosis not present

## 2022-09-03 ENCOUNTER — Ambulatory Visit
Admission: RE | Admit: 2022-09-03 | Discharge: 2022-09-03 | Disposition: A | Discharge status: Home / Self Care | Payer: Medicare PPO | Source: Ambulatory Visit | Attending: Family Medicine | Admitting: Family Medicine

## 2022-09-03 DIAGNOSIS — Z87891 Personal history of nicotine dependence: Secondary | ICD-10-CM | POA: Insufficient documentation

## 2022-09-03 DIAGNOSIS — Z122 Encounter for screening for malignant neoplasm of respiratory organs: Secondary | ICD-10-CM | POA: Insufficient documentation

## 2022-09-05 ENCOUNTER — Encounter: Payer: Self-pay | Admitting: Family Medicine

## 2022-09-06 DIAGNOSIS — R197 Diarrhea, unspecified: Secondary | ICD-10-CM | POA: Diagnosis not present

## 2022-09-07 ENCOUNTER — Other Ambulatory Visit: Payer: Self-pay | Admitting: Acute Care

## 2022-09-07 DIAGNOSIS — Z122 Encounter for screening for malignant neoplasm of respiratory organs: Secondary | ICD-10-CM

## 2022-09-07 DIAGNOSIS — Z87891 Personal history of nicotine dependence: Secondary | ICD-10-CM

## 2022-09-08 LAB — GASTROINTESTINAL PATHOGEN PNL
CampyloBacter Group: NOT DETECTED
Norovirus GI/GII: NOT DETECTED
Rotavirus A: NOT DETECTED
Salmonella species: NOT DETECTED
Shiga Toxin 1: NOT DETECTED
Shiga Toxin 2: NOT DETECTED
Shigella Species: NOT DETECTED
Vibrio Group: NOT DETECTED
Yersinia enterocolitica: NOT DETECTED

## 2022-09-19 DIAGNOSIS — M259 Joint disorder, unspecified: Secondary | ICD-10-CM | POA: Diagnosis not present

## 2022-09-19 DIAGNOSIS — Z4889 Encounter for other specified surgical aftercare: Secondary | ICD-10-CM | POA: Diagnosis not present

## 2022-09-20 ENCOUNTER — Other Ambulatory Visit: Payer: Self-pay | Admitting: Orthopedic Surgery

## 2022-09-20 DIAGNOSIS — M533 Sacrococcygeal disorders, not elsewhere classified: Secondary | ICD-10-CM

## 2022-10-11 ENCOUNTER — Ambulatory Visit
Admission: RE | Admit: 2022-10-11 | Discharge: 2022-10-11 | Disposition: A | Discharge status: Home / Self Care | Payer: Medicare PPO | Source: Ambulatory Visit | Attending: Orthopedic Surgery | Admitting: Orthopedic Surgery

## 2022-10-11 DIAGNOSIS — M533 Sacrococcygeal disorders, not elsewhere classified: Secondary | ICD-10-CM

## 2022-10-19 ENCOUNTER — Encounter: Payer: Self-pay | Admitting: Family Medicine

## 2022-10-22 ENCOUNTER — Other Ambulatory Visit: Payer: Self-pay | Admitting: Orthopedic Surgery

## 2022-10-22 DIAGNOSIS — M5416 Radiculopathy, lumbar region: Secondary | ICD-10-CM

## 2022-10-25 ENCOUNTER — Encounter: Payer: Self-pay | Admitting: Orthopedic Surgery

## 2022-10-26 DIAGNOSIS — M5451 Vertebrogenic low back pain: Secondary | ICD-10-CM | POA: Diagnosis not present

## 2022-10-30 ENCOUNTER — Inpatient Hospital Stay
Admission: RE | Admit: 2022-10-30 | Discharge: 2022-10-30 | Disposition: A | Discharge status: Home / Self Care | Payer: Medicare PPO | Source: Ambulatory Visit | Attending: Orthopedic Surgery | Admitting: Orthopedic Surgery

## 2022-10-30 ENCOUNTER — Other Ambulatory Visit: Payer: Medicare PPO

## 2022-10-31 DIAGNOSIS — M533 Sacrococcygeal disorders, not elsewhere classified: Secondary | ICD-10-CM | POA: Diagnosis not present

## 2022-11-06 NOTE — Discharge Instructions (Signed)

## 2022-11-07 ENCOUNTER — Ambulatory Visit
Admission: RE | Admit: 2022-11-07 | Discharge: 2022-11-07 | Disposition: A | Discharge status: Home / Self Care | Payer: Medicare PPO | Source: Ambulatory Visit | Attending: Orthopedic Surgery | Admitting: Orthopedic Surgery

## 2022-11-07 DIAGNOSIS — M4316 Spondylolisthesis, lumbar region: Secondary | ICD-10-CM | POA: Diagnosis not present

## 2022-11-07 DIAGNOSIS — M47896 Other spondylosis, lumbar region: Secondary | ICD-10-CM | POA: Diagnosis not present

## 2022-11-07 DIAGNOSIS — Z981 Arthrodesis status: Secondary | ICD-10-CM | POA: Diagnosis not present

## 2022-11-07 DIAGNOSIS — M5416 Radiculopathy, lumbar region: Secondary | ICD-10-CM

## 2022-11-07 MED ORDER — IOPAMIDOL (ISOVUE-M 200) INJECTION 41%
20.0000 mL | Freq: Once | INTRAMUSCULAR | Status: AC
  Administered 2022-11-07: 20 mL via INTRATHECAL

## 2022-11-07 MED ORDER — MEPERIDINE HCL 50 MG/ML IJ SOLN: 50.0000 mg | Freq: Once | INTRAMUSCULAR | Status: DC | PRN

## 2022-11-07 MED ORDER — ONDANSETRON HCL 4 MG/2ML IJ SOLN: 4.0000 mg | Freq: Once | INTRAMUSCULAR | Status: DC | PRN

## 2022-11-07 MED ORDER — DIAZEPAM 5 MG PO TABS
10.0000 mg | ORAL_TABLET | Freq: Once | ORAL | Status: AC
  Administered 2022-11-07: 5 mg via ORAL

## 2022-11-10 DIAGNOSIS — M47816 Spondylosis without myelopathy or radiculopathy, lumbar region: Secondary | ICD-10-CM | POA: Diagnosis not present

## 2022-11-10 DIAGNOSIS — M5416 Radiculopathy, lumbar region: Secondary | ICD-10-CM | POA: Diagnosis not present

## 2022-11-15 DIAGNOSIS — M5451 Vertebrogenic low back pain: Secondary | ICD-10-CM | POA: Diagnosis not present

## 2022-11-15 DIAGNOSIS — Z978 Presence of other specified devices: Secondary | ICD-10-CM | POA: Diagnosis not present

## 2022-11-16 ENCOUNTER — Ambulatory Visit: Payer: Medicare PPO | Admitting: Family Medicine

## 2022-11-16 ENCOUNTER — Encounter: Payer: Self-pay | Admitting: Family Medicine

## 2022-11-16 VITALS — BP 124/52 | HR 76 | Temp 98.0°F | Resp 12 | Ht 68.0 in | Wt 158.2 lb

## 2022-11-16 DIAGNOSIS — M5431 Sciatica, right side: Secondary | ICD-10-CM | POA: Diagnosis not present

## 2022-11-16 DIAGNOSIS — Z23 Encounter for immunization: Secondary | ICD-10-CM

## 2022-11-16 DIAGNOSIS — E871 Hypo-osmolality and hyponatremia: Secondary | ICD-10-CM

## 2022-11-16 DIAGNOSIS — Z01818 Encounter for other preprocedural examination: Secondary | ICD-10-CM

## 2022-11-16 NOTE — Progress Notes (Signed)
Name: Tara [name]   MRN: [id number]    DOB: [birth year]   Date:11/16/2022       Progress Note  Subjective  Chief Complaint  Surgical Clearance  HPI  Patient had lumbar fusion May 2023 and on June 2024 she  developed right lower back pain , constant , intense and radiating to right groin , also pain on right calf and top of her right foot. She had x-rays of the hip that were negative and myelogram showed a screw touching the L5- S1  nerve root ( per patient report - since I cannot see notes ). She is going to have removal of lumbar pedical screws and Dr. Shon Baton requested clearance  She has chronic medical problems that are stable. No SOB, recent URI . She never had reactions to anesthesia. No chest pain. Does not take any blood thinners.   Hyponatremia: we will recheck labs. She is now drinking some gatorade instead of just water all day   Patient Active Problem List   Diagnosis Date Noted   Hiatal hernia 03/27/2022   Osteopenia after menopause 02/15/2022   Gastroesophageal reflux disease    Centrilobular emphysema (HCC) 11/10/2021   Chronic insomnia 11/10/2021   S/P lumbar fusion 07/27/2021   Thoracic aorta atherosclerosis (HCC) 05/26/2021   Complex regional pain syndrome type 1 of right upper extremity 10/16/2019   Gastric erythema 07/24/2017   Purpura, nonthrombocytopenic (HCC) 05/17/2017   Fatty liver 01/28/2015   Barrett esophagus 09/10/2014   Major depression, recurrent, chronic (HCC) 09/08/2014   Insomnia, persistent 09/08/2014   Arthralgia, sacroiliac 09/08/2014   Degeneration of lumbar or lumbosacral intervertebral disc 09/08/2014   Gastro-esophageal reflux disease without esophagitis 09/08/2014   Perennial allergic rhinitis 09/08/2014   Chronic pain 01/25/2011    Past Surgical History:  Procedure Laterality Date   ABDOMINAL EXPOSURE N/A 07/27/2021   Procedure: ABDOMINAL EXPOSURE FOR ANTERIOR SPINE SURGERY;  Surgeon: Leonie Douglas, MD;  Location: [facility] OR;  Service:  Vascular;  Laterality: N/A;   ABDOMINAL HYSTERECTOMY  2005   partial   ANTERIOR AND POSTERIOR SPINAL FUSION N/A 07/27/2021   Procedure: ANTERIOR LUMBAR INTERBODY FUSION AND POSTERIOR SPINAL FUSION WITH PEDICLE SCREWS LUMBAR FIVE TO SACRAL ONE;  Surgeon: Venita Lick, MD;  Location: [facility] OR;  Service: Orthopedics;  Laterality: N/A;  4.5 hrs Dr. Lenell Antu to do approach left tap block with exparel 3 C-Bed   CERVICAL DISC ARTHROPLASTY  2008   ESOPHAGOGASTRODUODENOSCOPY (EGD) WITH PROPOFOL N/A 06/04/2017   Procedure: ESOPHAGOGASTRODUODENOSCOPY (EGD) WITH PROPOFOL;  Surgeon: Christena Deem, MD;  Location: [facility] ENDOSCOPY;  Service: Endoscopy;  Laterality: N/A;   ESOPHAGOGASTRODUODENOSCOPY (EGD) WITH PROPOFOL N/A 12/21/2021   Procedure: ESOPHAGOGASTRODUODENOSCOPY (EGD) WITH PROPOFOL;  Surgeon: Toney Reil, MD;  Location: [facility] ENDOSCOPY;  Service: Gastroenterology;  Laterality: N/A;   INSERTION OF MESH  03/27/2022   Procedure: INSERTION OF MESH;  Surgeon: Leafy Ro, MD;  Location: [facility] ORS;  Service: General;;   KNEE ARTHROSCOPY Right 2008   LUMBAR LAMINECTOMY/DECOMPRESSION MICRODISCECTOMY  01/25/2011   Procedure: LUMBAR LAMINECTOMY/DECOMPRESSION MICRODISCECTOMY;  Surgeon: Alvy Beal;  Location: [facility] OR;  Service: Orthopedics;  Laterality: Left;  LEFT L5-S1 MICRODISCECTOMY, Central Decompression Lumbar five-sacral one   OOPHORECTOMY Left 2011   OOPHORECTOMY Right 10/2012   SPINAL CORD STIMULATOR IMPLANT  2009   Removal-01/2012   SPINAL CORD STIMULATOR REMOVAL  01/17/2012   Procedure: LUMBAR SPINAL CORD STIMULATOR REMOVAL;  Surgeon: Venita Lick, MD;  Location: [facility] OR;  Service: Orthopedics;  Laterality: N/A;  Spinal Cord Battery Removal   TUBAL LIGATION  1988   XI ROBOTIC ASSISTED PARAESOPHAGEAL HERNIA REPAIR N/A 03/27/2022   Procedure: XI ROBOTIC ASSISTED PARAESOPHAGEAL HERNIA REPAIR, RNFA to assist;  Surgeon: Leafy Ro, MD;  Location: [facility] ORS;  Service: General;  Laterality:  N/A;    Family History  Problem Relation Age of Onset   Depression Mother    Ovarian cancer Mother    Hypertension Mother    Hypercholesterolemia Mother    Obesity Mother    Hypertension Sister    Obesity Sister    Stroke Sister    Healthy Sister    Healthy Daughter    Tongue cancer Daughter    Healthy Daughter    Breast cancer Neg Hx     Social History   Tobacco Use   Smoking status: Former    Current packs/day: 0.00    Average packs/day: 1 pack/day for 30.0 years (30.0 ttl pk-yrs)    Types: Cigarettes    Start date: 09/10/1986    Quit date: 2018    Years since quitting: 6.6   Smokeless tobacco: Never  Substance Use Topics   Alcohol use: No    Alcohol/week: 0.0 standard drinks of alcohol     Current Outpatient Medications:    CALCIUM PO, Take 1 tablet by mouth 2 (two) times daily., Disp: , Rfl:    Eszopiclone 3 MG TABS, Take 1 tablet (3 mg total) by mouth at bedtime. Take immediately before bedtime, Disp: 90 tablet, Rfl: 1   Fluticasone-Umeclidin-Vilant (TRELEGY ELLIPTA) 100-62.5-25 MCG/ACT AEPB, Inhale 1 puff into the lungs daily., Disp: 1 each, Rfl: 5   folic acid (FOLVITE) 400 MCG tablet, Take 400 mcg by mouth daily., Disp: , Rfl:    rosuvastatin (CRESTOR) 10 MG tablet, TAKE 1 TABLET(10 MG) BY MOUTH DAILY, Disp: 90 tablet, Rfl: 3   venlafaxine XR (EFFEXOR-XR) 150 MG 24 hr capsule, Take 1 capsule (150 mg total) by mouth daily with breakfast. TAKE 1 CAPSULE(150 MG) BY MOUTH DAILY WITH BREAKFAST, Disp: 90 capsule, Rfl: 1   vitamin B-12 (CYANOCOBALAMIN) 100 MCG tablet, Take 100 mcg by mouth daily., Disp: , Rfl:    VITAMIN D PO, Take 1 tablet by mouth 2 (two) times daily., Disp: , Rfl:   Allergies  Allergen Reactions   Clonidine Derivatives Itching    I personally reviewed active problem list, medication list, allergies, family history, social history, health maintenance with the patient/caregiver today.   ROS  Ten systems reviewed and is negative except as  mentioned in HPI    Objective  Vitals:   11/16/22 0900  BP: (!) 124/52  Pulse: 76  Resp: 12  Temp: 98 F (36.7 C)  TempSrc: Oral  SpO2: 99%  Weight: 158 lb 3.2 oz (71.8 kg)  Height: 5\' 8"  (1.727 m)    Body mass index is 24.05 kg/m.  Physical Exam  Constitutional: Patient appears well-developed and well-nourished.  No distress.  HEENT: head atraumatic, normocephalic, pupils equal and reactive to light, neck supple, throat within normal limits Cardiovascular: Normal rate, regular rhythm and normal heart sounds.  No murmur heard. No BLE edema. Pulmonary/Chest: Effort normal and breath sounds normal. No respiratory distress. Abdominal: Soft.  There is no tenderness. Muscular skeletal: pain during palpation of paraspinal muscles on right side, pain with flexion, right and left lateral bending  Psychiatric: Patient has a normal mood and affect. behavior is normal. Judgment and thought content normal.    PHQ2/9:    11/16/2022  9:02 AM 08/20/2022   11:47 AM 07/19/2022   11:33 AM 02/15/2022   11:16 AM 11/10/2021    2:23 PM  Depression screen PHQ 2/9  Decreased Interest 1 0 0 0 0  Down, Depressed, Hopeless 1 0 0 0 0  PHQ - 2 Score 2 0 0 0 0  Altered sleeping 1 0  0 0  Tired, decreased energy 1 0  3 0  Change in appetite 1 0  0 0  Feeling bad or failure about yourself  0 0  0 0  Trouble concentrating 1 0  0 0  Moving slowly or fidgety/restless 1 0  0 0  Suicidal thoughts 0 0  0 0  PHQ-9 Score 7 0  3 0  Difficult doing work/chores Somewhat difficult Not difficult at all       phq 9 is positive   Assessment & Plan  1. Sciatica of right side  She will have surgery   2. Hyponatremia  - BASIC METABOLIC PANEL WITH GFR  3. Pre-op evaluation  May proceed to surgery without further testing

## 2022-11-16 NOTE — Addendum Note (Signed)
Addended by: Dollene Primrose on: 11/16/2022 09:36 AM   Modules accepted: Orders

## 2022-11-17 ENCOUNTER — Encounter: Payer: Self-pay | Admitting: Family Medicine

## 2022-11-17 LAB — BASIC METABOLIC PANEL WITH GFR
BUN/Creatinine Ratio: 20 (calc) (ref 6–22)
BUN: 23 mg/dL (ref 7–25)
CO2: 23 mmol/L (ref 20–32)
Calcium: 9.3 mg/dL (ref 8.6–10.4)
Chloride: 101 mmol/L (ref 98–110)
Creat: 1.13 mg/dL — ABNORMAL HIGH (ref 0.50–1.03)
Glucose, Bld: 97 mg/dL (ref 65–99)
Potassium: 4.9 mmol/L (ref 3.5–5.3)
Sodium: 135 mmol/L (ref 135–146)
eGFR: 56 mL/min/{1.73_m2} — ABNORMAL LOW (ref >=60)

## 2022-11-19 ENCOUNTER — Other Ambulatory Visit: Payer: Self-pay | Admitting: Family Medicine

## 2022-11-19 DIAGNOSIS — R944 Abnormal results of kidney function studies: Secondary | ICD-10-CM

## 2022-11-26 NOTE — Pre-Procedure Instructions (Signed)
Surgical Instructions   Your procedure is scheduled on November 29, 2022. Report to [facility] Main Entrance "A" at 11:00 A.M., then check in with the Admitting office. Any questions or running late day of surgery: call [phone]  Questions prior to your surgery date: call [phone], Monday-Friday, 8am-4pm. If you experience any cold or flu symptoms such as cough, fever, chills, shortness of breath, etc. between now and your scheduled surgery, please notify us at the above number.     Remember:  Do not eat after midnight the night before your surgery  You may drink clear liquids until 10:00 AM the morning of your surgery.   Clear liquids allowed are: Water, Non-Citrus Juices (without pulp), Carbonated Beverages, Clear Tea, Black Coffee Only (NO MILK, CREAM OR POWDERED CREAMER of any kind), and Gatorade.    Take these medicines the morning of surgery with A SIP OF WATER: rosuvastatin (CRESTOR)  venlafaxine XR (EFFEXOR-XR)     One week prior to surgery, STOP taking any Aspirin (unless otherwise instructed by your surgeon) Aleve, Naproxen, Ibuprofen, Motrin, Advil, Goody's, BC's, all herbal medications, fish oil, and non-prescription vitamins. This includes your medication: diclofenac Sodium (VOLTAREN) GEL                      Do NOT Smoke (Tobacco/Vaping) for 24 hours prior to your procedure.  If you use a CPAP at night, you may bring your mask/headgear for your overnight stay.   You will be asked to remove any contacts, glasses, piercing's, hearing aid's, dentures/partials prior to surgery. Please bring cases for these items if needed.    Patients discharged the day of surgery will not be allowed to drive home, and someone needs to stay with them for 24 hours.  SURGICAL WAITING ROOM VISITATION Patients may have no more than 2 support people in the waiting area - these visitors may rotate.   Pre-op nurse will coordinate an appropriate time for 1 ADULT support person, who may  not rotate, to accompany patient in pre-op.  Children under the age of 94 must have an adult with them who is not the patient and must remain in the main waiting area with an adult.  If the patient needs to stay at the hospital during part of their recovery, the visitor guidelines for inpatient rooms apply.  Please refer to the [facility] website for the visitor guidelines for any additional information.   If you received a COVID test during your pre-op visit  it is requested that you wear a mask when out in public, stay away from anyone that may not be feeling well and notify your surgeon if you develop symptoms. If you have been in contact with anyone that has tested positive in the last 10 days please notify you surgeon.      Pre-operative 5 CHG Bathing Instructions   You can play a key role in reducing the risk of infection after surgery. Your skin needs to be as free of germs as possible. You can reduce the number of germs on your skin by washing with CHG (chlorhexidine gluconate) soap before surgery. CHG is an antiseptic soap that kills germs and continues to kill germs even after washing.   DO NOT use if you have an allergy to chlorhexidine/CHG or antibacterial soaps. If your skin becomes reddened or irritated, stop using the CHG and notify one of our RNs at [phone].   Please shower with the CHG soap starting 4 days before surgery  using the following schedule:     Please keep in mind the following:  DO NOT shave, including legs and underarms, starting the day of your first shower.   You may shave your face at any point before/day of surgery.  Place clean sheets on your bed the day you start using CHG soap. Use a clean washcloth (not used since being washed) for each shower. DO NOT sleep with pets once you start using the CHG.   CHG Shower Instructions:  If you choose to wash your hair and private area, wash first with your normal shampoo/soap.  After you use shampoo/soap,  rinse your hair and body thoroughly to remove shampoo/soap residue.  Turn the water OFF and apply about 3 tablespoons (45 ml) of CHG soap to a CLEAN washcloth.  Apply CHG soap ONLY FROM YOUR NECK DOWN TO YOUR TOES (washing for 3-5 minutes)  DO NOT use CHG soap on face, private areas, open wounds, or sores.  Pay special attention to the area where your surgery is being performed.  If you are having back surgery, having someone wash your back for you may be helpful. Wait 2 minutes after CHG soap is applied, then you may rinse off the CHG soap.  Pat dry with a clean towel  Put on clean clothes/pajamas   If you choose to wear lotion, please use ONLY the CHG-compatible lotions on the back of this paper.   Additional instructions for the day of surgery: DO NOT APPLY any lotions, deodorants, cologne, or perfumes.   Do not bring valuables to the hospital. [facility] is not responsible for any belongings/valuables. Do not wear nail polish, gel polish, artificial nails, or any other type of covering on natural nails (fingers and toes) Do not wear jewelry or makeup Put on clean/comfortable clothes.  Please brush your teeth.  Ask your nurse before applying any prescription medications to the skin.     CHG Compatible Lotions   Aveeno Moisturizing lotion  Cetaphil Moisturizing Cream  Cetaphil Moisturizing Lotion  Clairol Herbal Essence Moisturizing Lotion, Dry Skin  Clairol Herbal Essence Moisturizing Lotion, Extra Dry Skin  Clairol Herbal Essence Moisturizing Lotion, Normal Skin  Curel Age Defying Therapeutic Moisturizing Lotion with Alpha Hydroxy  Curel Extreme Care Body Lotion  Curel Soothing Hands Moisturizing Hand Lotion  Curel Therapeutic Moisturizing Cream, Fragrance-Free  Curel Therapeutic Moisturizing Lotion, Fragrance-Free  Curel Therapeutic Moisturizing Lotion, Original Formula  Eucerin Daily Replenishing Lotion  Eucerin Dry Skin Therapy Plus Alpha Hydroxy Crme  Eucerin Dry Skin  Therapy Plus Alpha Hydroxy Lotion  Eucerin Original Crme  Eucerin Original Lotion  Eucerin Plus Crme Eucerin Plus Lotion  Eucerin TriLipid Replenishing Lotion  Keri Anti-Bacterial Hand Lotion  Keri Deep Conditioning Original Lotion Dry Skin Formula Softly Scented  Keri Deep Conditioning Original Lotion, Fragrance Free Sensitive Skin Formula  Keri Lotion Fast Absorbing Fragrance Free Sensitive Skin Formula  Keri Lotion Fast Absorbing Softly Scented Dry Skin Formula  Keri Original Lotion  Keri Skin Renewal Lotion Keri Silky Smooth Lotion  Keri Silky Smooth Sensitive Skin Lotion  Nivea Body Creamy Conditioning Oil  Nivea Body Extra Enriched Lotion  Nivea Body Original Lotion  Nivea Body Sheer Moisturizing Lotion Nivea Crme  Nivea Skin Firming Lotion  NutraDerm 30 Skin Lotion  NutraDerm Skin Lotion  NutraDerm Therapeutic Skin Cream  NutraDerm Therapeutic Skin Lotion  ProShield Protective Hand Cream  Provon moisturizing lotion  Please read over the following fact sheets that you were given.

## 2022-11-27 ENCOUNTER — Encounter (HOSPITAL_COMMUNITY): Payer: Self-pay

## 2022-11-27 ENCOUNTER — Ambulatory Visit (HOSPITAL_COMMUNITY): Payer: Self-pay | Admitting: Orthopedic Surgery

## 2022-11-27 ENCOUNTER — Other Ambulatory Visit: Payer: Self-pay

## 2022-11-27 ENCOUNTER — Encounter (HOSPITAL_COMMUNITY)
Admission: RE | Admit: 2022-11-27 | Discharge: 2022-11-27 | Disposition: A | Discharge status: Home / Self Care | Payer: Medicare PPO | Source: Ambulatory Visit | Attending: Orthopedic Surgery | Admitting: Orthopedic Surgery

## 2022-11-27 VITALS — BP 121/64 | HR 82 | Temp 98.5°F | Resp 18 | Ht 68.0 in | Wt 155.2 lb

## 2022-11-27 DIAGNOSIS — Z981 Arthrodesis status: Secondary | ICD-10-CM | POA: Diagnosis not present

## 2022-11-27 DIAGNOSIS — F32A Depression, unspecified: Secondary | ICD-10-CM | POA: Diagnosis present

## 2022-11-27 DIAGNOSIS — Z8701 Personal history of pneumonia (recurrent): Secondary | ICD-10-CM | POA: Diagnosis not present

## 2022-11-27 DIAGNOSIS — J449 Chronic obstructive pulmonary disease, unspecified: Secondary | ICD-10-CM | POA: Diagnosis present

## 2022-11-27 DIAGNOSIS — Z888 Allergy status to other drugs, medicaments and biological substances status: Secondary | ICD-10-CM | POA: Diagnosis not present

## 2022-11-27 DIAGNOSIS — Z90722 Acquired absence of ovaries, bilateral: Secondary | ICD-10-CM | POA: Diagnosis not present

## 2022-11-27 DIAGNOSIS — Z472 Encounter for removal of internal fixation device: Secondary | ICD-10-CM | POA: Diagnosis not present

## 2022-11-27 DIAGNOSIS — T84296A Other mechanical complication of internal fixation device of vertebrae, initial encounter: Secondary | ICD-10-CM | POA: Diagnosis not present

## 2022-11-27 DIAGNOSIS — Z8719 Personal history of other diseases of the digestive system: Secondary | ICD-10-CM | POA: Diagnosis not present

## 2022-11-27 DIAGNOSIS — Z9851 Tubal ligation status: Secondary | ICD-10-CM | POA: Diagnosis not present

## 2022-11-27 DIAGNOSIS — Z9889 Other specified postprocedural states: Secondary | ICD-10-CM | POA: Diagnosis not present

## 2022-11-27 DIAGNOSIS — Z01818 Encounter for other preprocedural examination: Secondary | ICD-10-CM

## 2022-11-27 DIAGNOSIS — K2270 Barrett's esophagus without dysplasia: Secondary | ICD-10-CM | POA: Diagnosis present

## 2022-11-27 DIAGNOSIS — Z87891 Personal history of nicotine dependence: Secondary | ICD-10-CM | POA: Diagnosis not present

## 2022-11-27 DIAGNOSIS — Y792 Prosthetic and other implants, materials and accessory orthopedic devices associated with adverse incidents: Secondary | ICD-10-CM | POA: Diagnosis present

## 2022-11-27 DIAGNOSIS — Z01812 Encounter for preprocedural laboratory examination: Secondary | ICD-10-CM | POA: Insufficient documentation

## 2022-11-27 DIAGNOSIS — Z79899 Other long term (current) drug therapy: Secondary | ICD-10-CM | POA: Diagnosis not present

## 2022-11-27 DIAGNOSIS — K760 Fatty (change of) liver, not elsewhere classified: Secondary | ICD-10-CM | POA: Diagnosis present

## 2022-11-27 DIAGNOSIS — Y838 Other surgical procedures as the cause of abnormal reaction of the patient, or of later complication, without mention of misadventure at the time of the procedure: Secondary | ICD-10-CM | POA: Diagnosis present

## 2022-11-27 DIAGNOSIS — M549 Dorsalgia, unspecified: Secondary | ICD-10-CM | POA: Diagnosis present

## 2022-11-27 DIAGNOSIS — K219 Gastro-esophageal reflux disease without esophagitis: Secondary | ICD-10-CM | POA: Diagnosis present

## 2022-11-27 DIAGNOSIS — F419 Anxiety disorder, unspecified: Secondary | ICD-10-CM | POA: Diagnosis present

## 2022-11-27 DIAGNOSIS — Z9682 Presence of neurostimulator: Secondary | ICD-10-CM | POA: Diagnosis not present

## 2022-11-27 DIAGNOSIS — T8484XA Pain due to internal orthopedic prosthetic devices, implants and grafts, initial encounter: Secondary | ICD-10-CM | POA: Diagnosis present

## 2022-11-27 DIAGNOSIS — Z90710 Acquired absence of both cervix and uterus: Secondary | ICD-10-CM | POA: Diagnosis not present

## 2022-11-27 DIAGNOSIS — M479 Spondylosis, unspecified: Secondary | ICD-10-CM | POA: Diagnosis present

## 2022-11-27 DIAGNOSIS — G8929 Other chronic pain: Secondary | ICD-10-CM | POA: Diagnosis present

## 2022-11-27 HISTORY — DX: Personal history of other diseases of the digestive system: Z87.19

## 2022-11-27 LAB — BASIC METABOLIC PANEL
Anion gap: 10 (ref 5–15)
BUN: 18 mg/dL (ref 6–20)
CO2: 28 mmol/L (ref 22–32)
Calcium: 9.6 mg/dL (ref 8.9–10.3)
Chloride: 93 mmol/L — ABNORMAL LOW (ref 98–111)
Creatinine, Ser: 0.8 mg/dL (ref 0.44–1.00)
GFR, Estimated: >60 mL/min (ref >60)
Glucose, Bld: 92 mg/dL (ref 70–99)
Potassium: 4.7 mmol/L (ref 3.5–5.1)
Sodium: 131 mmol/L — ABNORMAL LOW (ref 135–145)

## 2022-11-27 LAB — CBC
HCT: 40.7 % (ref 36.0–46.0)
Hemoglobin: 13.7 g/dL (ref 12.0–15.0)
MCH: 32.2 pg (ref 26.0–34.0)
MCHC: 33.7 g/dL (ref 30.0–36.0)
MCV: 95.5 fL (ref 80.0–100.0)
Platelets: 385 10*3/uL (ref 150–400)
RBC: 4.26 MIL/uL (ref 3.87–5.11)
RDW: 12.5 % (ref 11.5–15.5)
WBC: 8.8 10*3/uL (ref 4.0–10.5)
nRBC: 0 % (ref 0.0–0.2)

## 2022-11-27 LAB — SURGICAL PCR SCREEN
MRSA, PCR: NEGATIVE
Staphylococcus aureus: NEGATIVE

## 2022-11-27 NOTE — Progress Notes (Signed)
PCP - Dr. Alba Cory  Cardiologist - no  EP-no  Endocrine-no  Pulm-no  Chest x-ray - na  EKG - na  Stress Test - no  ECHO - no  Cardiac Cath - no  AICD-no PM-no LOOP-no  Nerve Stimulator-no  Dialysis-no  Sleep Study - no CPAP - no  LABS-CBC, BMP, PCR.  ASA-no  ERAS-clear liquids per anesthesia  HA1C-na GLP-1-no Fasting Blood Sugar - na Checks Blood Sugar __0___ times a day  Anesthesia-  Pt denies having chest pain, sob, or fever at this time. All instructions explained to the pt, with a verbal understanding of the material. Pt agrees to go over the instructions while at home for a better understanding. The opportunity to ask questions was provided.

## 2022-11-28 NOTE — Anesthesia Preprocedure Evaluation (Signed)
Anesthesia Evaluation  Patient identified by MRN, date of birth, ID band Patient awake    Reviewed: Allergy & Precautions, H&P , NPO status , Patient's Chart, lab work & pertinent test results  Airway Mallampati: II  TM Distance: >3 FB Neck ROM: Full    Dental no notable dental hx.    Pulmonary COPD, former smoker   Pulmonary exam normal breath sounds clear to auscultation       Cardiovascular negative cardio ROS Normal cardiovascular exam Rhythm:Regular Rate:Normal     Neuro/Psych   Anxiety Depression    CPRS 1  Neuromuscular disease    GI/Hepatic Neg liver ROS,GERD  ,,  Endo/Other  negative endocrine ROS    Renal/GU negative Renal ROS  negative genitourinary   Musculoskeletal negative musculoskeletal ROS (+)    Abdominal   Peds negative pediatric ROS (+)  Hematology negative hematology ROS (+)   Anesthesia Other Findings   Reproductive/Obstetrics negative OB ROS                             Anesthesia Physical Anesthesia Plan  ASA: 2  Anesthesia Plan: General   Post-op Pain Management:    Induction: Intravenous  PONV Risk Score and Plan: 3 and Ondansetron, Dexamethasone and Treatment may vary due to age or medical condition  Airway Management Planned: Oral ETT  Additional Equipment:   Intra-op Plan:   Post-operative Plan: Extubation in OR  Informed Consent: I have reviewed the patients History and Physical, chart, labs and discussed the procedure including the risks, benefits and alternatives for the proposed anesthesia with the patient or authorized representative who has indicated his/her understanding and acceptance.     Dental advisory given  Plan Discussed with: CRNA and Surgeon  Anesthesia Plan Comments: (PAT note written 11/28/2022 by Shonna Chock, PA-C.  )       Anesthesia Quick Evaluation
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
## 2022-11-29 ENCOUNTER — Encounter (HOSPITAL_COMMUNITY): Payer: Self-pay | Admitting: Orthopedic Surgery

## 2022-11-29 ENCOUNTER — Inpatient Hospital Stay (HOSPITAL_COMMUNITY): Payer: Medicare PPO | Admitting: Anesthesiology

## 2022-11-29 ENCOUNTER — Inpatient Hospital Stay (HOSPITAL_COMMUNITY)
Admission: RE | Admit: 2022-11-29 | Discharge: 2022-11-29 | DRG: 517 | Disposition: A | Discharge status: Home / Self Care | Payer: Medicare PPO | Source: Ambulatory Visit | Attending: Orthopedic Surgery | Admitting: Orthopedic Surgery

## 2022-11-29 ENCOUNTER — Inpatient Hospital Stay (HOSPITAL_COMMUNITY): Payer: Medicare PPO

## 2022-11-29 ENCOUNTER — Encounter: Payer: Self-pay | Admitting: Family Medicine

## 2022-11-29 ENCOUNTER — Encounter (HOSPITAL_COMMUNITY)
Admission: RE | Disposition: A | Discharge status: Home / Self Care | Payer: Self-pay | Source: Ambulatory Visit | Attending: Orthopedic Surgery

## 2022-11-29 ENCOUNTER — Inpatient Hospital Stay (HOSPITAL_COMMUNITY): Payer: Medicare PPO | Admitting: Vascular Surgery

## 2022-11-29 ENCOUNTER — Other Ambulatory Visit: Payer: Self-pay

## 2022-11-29 DIAGNOSIS — J449 Chronic obstructive pulmonary disease, unspecified: Secondary | ICD-10-CM | POA: Diagnosis present

## 2022-11-29 DIAGNOSIS — Z9889 Other specified postprocedural states: Secondary | ICD-10-CM

## 2022-11-29 DIAGNOSIS — F419 Anxiety disorder, unspecified: Secondary | ICD-10-CM | POA: Diagnosis present

## 2022-11-29 DIAGNOSIS — Z472 Encounter for removal of internal fixation device: Secondary | ICD-10-CM | POA: Diagnosis not present

## 2022-11-29 DIAGNOSIS — M479 Spondylosis, unspecified: Secondary | ICD-10-CM | POA: Diagnosis present

## 2022-11-29 DIAGNOSIS — T8484XA Pain due to internal orthopedic prosthetic devices, implants and grafts, initial encounter: Secondary | ICD-10-CM | POA: Diagnosis present

## 2022-11-29 DIAGNOSIS — K2270 Barrett's esophagus without dysplasia: Secondary | ICD-10-CM | POA: Diagnosis present

## 2022-11-29 DIAGNOSIS — Y838 Other surgical procedures as the cause of abnormal reaction of the patient, or of later complication, without mention of misadventure at the time of the procedure: Secondary | ICD-10-CM | POA: Diagnosis present

## 2022-11-29 DIAGNOSIS — Z79899 Other long term (current) drug therapy: Secondary | ICD-10-CM | POA: Diagnosis not present

## 2022-11-29 DIAGNOSIS — Z981 Arthrodesis status: Secondary | ICD-10-CM

## 2022-11-29 DIAGNOSIS — F32A Depression, unspecified: Secondary | ICD-10-CM | POA: Diagnosis present

## 2022-11-29 DIAGNOSIS — Y792 Prosthetic and other implants, materials and accessory orthopedic devices associated with adverse incidents: Secondary | ICD-10-CM | POA: Diagnosis present

## 2022-11-29 DIAGNOSIS — G8929 Other chronic pain: Secondary | ICD-10-CM | POA: Diagnosis present

## 2022-11-29 DIAGNOSIS — Z9851 Tubal ligation status: Secondary | ICD-10-CM | POA: Diagnosis not present

## 2022-11-29 DIAGNOSIS — M549 Dorsalgia, unspecified: Secondary | ICD-10-CM | POA: Diagnosis present

## 2022-11-29 DIAGNOSIS — Z87891 Personal history of nicotine dependence: Secondary | ICD-10-CM

## 2022-11-29 DIAGNOSIS — Z8719 Personal history of other diseases of the digestive system: Secondary | ICD-10-CM | POA: Diagnosis not present

## 2022-11-29 DIAGNOSIS — K760 Fatty (change of) liver, not elsewhere classified: Secondary | ICD-10-CM | POA: Diagnosis present

## 2022-11-29 DIAGNOSIS — Z01818 Encounter for other preprocedural examination: Secondary | ICD-10-CM

## 2022-11-29 DIAGNOSIS — Z9682 Presence of neurostimulator: Secondary | ICD-10-CM | POA: Diagnosis not present

## 2022-11-29 DIAGNOSIS — Z8701 Personal history of pneumonia (recurrent): Secondary | ICD-10-CM | POA: Diagnosis not present

## 2022-11-29 DIAGNOSIS — Z888 Allergy status to other drugs, medicaments and biological substances status: Secondary | ICD-10-CM | POA: Diagnosis not present

## 2022-11-29 DIAGNOSIS — Z90722 Acquired absence of ovaries, bilateral: Secondary | ICD-10-CM | POA: Diagnosis not present

## 2022-11-29 DIAGNOSIS — K219 Gastro-esophageal reflux disease without esophagitis: Secondary | ICD-10-CM | POA: Diagnosis present

## 2022-11-29 DIAGNOSIS — Z90710 Acquired absence of both cervix and uterus: Secondary | ICD-10-CM | POA: Diagnosis not present

## 2022-11-29 HISTORY — PX: HARDWARE REMOVAL: SHX979

## 2022-11-29 SURGERY — REMOVAL, HARDWARE
Anesthesia: General | Site: Spine Lumbar

## 2022-11-29 MED ORDER — BUPIVACAINE-EPINEPHRINE 0.25% -1:200000 IJ SOLN
INTRAMUSCULAR | Status: DC | PRN
  Administered 2022-11-29: 10 mL

## 2022-11-29 MED ORDER — OXYCODONE HCL 5 MG PO TABS
5.0000 mg | ORAL_TABLET | Freq: Once | ORAL | Status: AC | PRN
  Administered 2022-11-29: 5 mg via ORAL

## 2022-11-29 MED ORDER — ROCURONIUM BROMIDE 10 MG/ML (PF) SYRINGE
PREFILLED_SYRINGE | INTRAVENOUS | Status: DC | PRN
  Administered 2022-11-29: 60 mg via INTRAVENOUS

## 2022-11-29 MED ORDER — ONDANSETRON HCL 4 MG/2ML IJ SOLN: 4.0000 mg | Freq: Once | INTRAMUSCULAR | Status: DC | PRN

## 2022-11-29 MED ORDER — BUPIVACAINE-EPINEPHRINE (PF) 0.25% -1:200000 IJ SOLN
INTRAMUSCULAR | Status: AC
  Filled 2022-11-29: qty 30

## 2022-11-29 MED ORDER — SURGIFLO WITH THROMBIN (HEMOSTATIC MATRIX KIT) OPTIME
TOPICAL | Status: DC | PRN
Start: 2022-11-29 — End: 2022-11-29
  Administered 2022-11-29: 1 via TOPICAL

## 2022-11-29 MED ORDER — KETOROLAC TROMETHAMINE 30 MG/ML IJ SOLN
30.0000 mg | Freq: Once | INTRAMUSCULAR | Status: AC | PRN
  Administered 2022-11-29: 30 mg via INTRAVENOUS

## 2022-11-29 MED ORDER — LIDOCAINE 2% (20 MG/ML) 5 ML SYRINGE
INTRAMUSCULAR | Status: DC | PRN
  Administered 2022-11-29: 80 mg via INTRAVENOUS

## 2022-11-29 MED ORDER — DEXAMETHASONE SODIUM PHOSPHATE 10 MG/ML IJ SOLN
INTRAMUSCULAR | Status: AC
  Filled 2022-11-29: qty 1

## 2022-11-29 MED ORDER — PHENYLEPHRINE 80 MCG/ML (10ML) SYRINGE FOR IV PUSH (FOR BLOOD PRESSURE SUPPORT)
PREFILLED_SYRINGE | INTRAVENOUS | Status: DC | PRN
Start: 2022-11-29 — End: 2022-11-29
  Administered 2022-11-29 (×3): 160 ug via INTRAVENOUS

## 2022-11-29 MED ORDER — HYDROMORPHONE HCL 1 MG/ML IJ SOLN
INTRAMUSCULAR | Status: DC | PRN
Start: 2022-11-29 — End: 2022-11-29
  Administered 2022-11-29: .5 mg via INTRAVENOUS

## 2022-11-29 MED ORDER — CEFAZOLIN SODIUM-DEXTROSE 2-4 GM/100ML-% IV SOLN
2.0000 g | INTRAVENOUS | Status: AC
  Administered 2022-11-29: 2 g via INTRAVENOUS
  Filled 2022-11-29: qty 100

## 2022-11-29 MED ORDER — OXYCODONE HCL 5 MG/5ML PO SOLN: 5.0000 mg | Freq: Once | ORAL | Status: AC | PRN

## 2022-11-29 MED ORDER — ONDANSETRON HCL 4 MG/2ML IJ SOLN
INTRAMUSCULAR | Status: AC
  Filled 2022-11-29: qty 2

## 2022-11-29 MED ORDER — LIDOCAINE 2% (20 MG/ML) 5 ML SYRINGE
INTRAMUSCULAR | Status: AC
  Filled 2022-11-29: qty 5

## 2022-11-29 MED ORDER — OXYCODONE HCL 5 MG PO TABS
ORAL_TABLET | ORAL | Status: AC
  Filled 2022-11-29: qty 1

## 2022-11-29 MED ORDER — ONDANSETRON HCL 4 MG/2ML IJ SOLN
INTRAMUSCULAR | Status: DC | PRN
  Administered 2022-11-29: 4 mg via INTRAVENOUS

## 2022-11-29 MED ORDER — HYDROMORPHONE HCL 1 MG/ML IJ SOLN
INTRAMUSCULAR | Status: AC
  Filled 2022-11-29: qty 1

## 2022-11-29 MED ORDER — BUPIVACAINE LIPOSOME 1.3 % IJ SUSP
INTRAMUSCULAR | Status: AC
  Filled 2022-11-29: qty 20

## 2022-11-29 MED ORDER — KETOROLAC TROMETHAMINE 30 MG/ML IJ SOLN
INTRAMUSCULAR | Status: AC
  Filled 2022-11-29: qty 1

## 2022-11-29 MED ORDER — EPHEDRINE SULFATE (PRESSORS) 50 MG/ML IJ SOLN
INTRAMUSCULAR | Status: DC | PRN
Start: 2022-11-29 — End: 2022-11-29
  Administered 2022-11-29: 10 mg via INTRAVENOUS

## 2022-11-29 MED ORDER — ROCURONIUM BROMIDE 10 MG/ML (PF) SYRINGE
PREFILLED_SYRINGE | INTRAVENOUS | Status: AC
  Filled 2022-11-29: qty 10

## 2022-11-29 MED ORDER — LACTATED RINGERS IV SOLN: INTRAVENOUS | Status: DC

## 2022-11-29 MED ORDER — KETAMINE HCL 10 MG/ML IJ SOLN
INTRAMUSCULAR | Status: DC | PRN
Start: 2022-11-29 — End: 2022-11-29
  Administered 2022-11-29: 20 mg via INTRAVENOUS

## 2022-11-29 MED ORDER — KETAMINE HCL 50 MG/5ML IJ SOSY
PREFILLED_SYRINGE | INTRAMUSCULAR | Status: AC
  Filled 2022-11-29: qty 5

## 2022-11-29 MED ORDER — MIDAZOLAM HCL 2 MG/2ML IJ SOLN
INTRAMUSCULAR | Status: DC | PRN
  Administered 2022-11-29: 2 mg via INTRAVENOUS

## 2022-11-29 MED ORDER — CHLORHEXIDINE GLUCONATE 0.12 % MT SOLN
15.0000 mL | Freq: Once | OROMUCOSAL | Status: AC
  Administered 2022-11-29: 15 mL via OROMUCOSAL
  Filled 2022-11-29: qty 15

## 2022-11-29 MED ORDER — SUGAMMADEX SODIUM 200 MG/2ML IV SOLN
INTRAVENOUS | Status: DC | PRN
  Administered 2022-11-29: 300 mg via INTRAVENOUS

## 2022-11-29 MED ORDER — PROPOFOL 10 MG/ML IV BOLUS
INTRAVENOUS | Status: DC | PRN
  Administered 2022-11-29: 150 mg via INTRAVENOUS

## 2022-11-29 MED ORDER — HYDROMORPHONE HCL 1 MG/ML IJ SOLN
INTRAMUSCULAR | Status: AC
  Filled 2022-11-29: qty 0.5

## 2022-11-29 MED ORDER — HYDROMORPHONE HCL 1 MG/ML IJ SOLN
0.2500 mg | INTRAMUSCULAR | Status: DC | PRN
  Administered 2022-11-29 (×4): 0.5 mg via INTRAVENOUS

## 2022-11-29 MED ORDER — MIDAZOLAM HCL 2 MG/2ML IJ SOLN
INTRAMUSCULAR | Status: AC
  Filled 2022-11-29: qty 2

## 2022-11-29 MED ORDER — BUPIVACAINE-EPINEPHRINE (PF) 0.25% -1:200000 IJ SOLN
INTRAMUSCULAR | Status: DC | PRN
  Administered 2022-11-29: 30 mL

## 2022-11-29 MED ORDER — ORAL CARE MOUTH RINSE: 15.0000 mL | Freq: Once | OROMUCOSAL | Status: AC

## 2022-11-29 MED ORDER — DEXAMETHASONE SODIUM PHOSPHATE 10 MG/ML IJ SOLN
INTRAMUSCULAR | Status: DC | PRN
  Administered 2022-11-29: 10 mg via INTRAVENOUS

## 2022-11-29 SURGICAL SUPPLY — 78 items
ADH SKN CLS APL DERMABOND .7 (GAUZE/BANDAGES/DRESSINGS)
AGENT HMST KT MTR STRL THRMB (HEMOSTASIS) ×1
BAG COUNTER SPONGE SURGICOUNT (BAG) ×2 IMPLANT
BAG SPNG CNTER NS LX DISP (BAG) ×1
CLSR STERI-STRIP ANTIMIC 1/2X4 (GAUZE/BANDAGES/DRESSINGS) IMPLANT
CNTNR URN SCR LID CUP LEK RST (MISCELLANEOUS) IMPLANT
CONT SPEC 4OZ STRL OR WHT (MISCELLANEOUS) ×1
CORD BIPOLAR FORCEPS 12FT (ELECTRODE) ×2 IMPLANT
COVER BACK TABLE 80X110 HD (DRAPES) ×1 IMPLANT
COVER MAYO STAND STRL (DRAPES) ×2 IMPLANT
COVER SURGICAL LIGHT HANDLE (MISCELLANEOUS) ×2 IMPLANT
CUFF TOURN SGL QUICK 34 (TOURNIQUET CUFF)
CUFF TOURN SGL QUICK 42 (TOURNIQUET CUFF) IMPLANT
CUFF TRNQT CYL 34X4.125X (TOURNIQUET CUFF) IMPLANT
DERMABOND ADVANCED .7 DNX12 (GAUZE/BANDAGES/DRESSINGS) ×2 IMPLANT
DRAIN CHANNEL 15F RND FF W/TCR (WOUND CARE) IMPLANT
DRAPE C-ARM 42X72 X-RAY (DRAPES) IMPLANT
DRAPE C-ARMOR (DRAPES) IMPLANT
DRAPE HALF SHEET 40X57 (DRAPES) ×2 IMPLANT
DRAPE INCISE IOBAN 66X45 STRL (DRAPES) IMPLANT
DRAPE POUCH INSTRU U-SHP 10X18 (DRAPES) ×1 IMPLANT
DRAPE SURG 17X23 STRL (DRAPES) ×2 IMPLANT
DRAPE U-SHAPE 47X51 STRL (DRAPES) ×2 IMPLANT
DRSG ADAPTIC 3X8 NADH LF (GAUZE/BANDAGES/DRESSINGS) ×2 IMPLANT
DRSG OPSITE 6X11 MED (GAUZE/BANDAGES/DRESSINGS) ×2 IMPLANT
DRSG OPSITE POSTOP 4X6 (GAUZE/BANDAGES/DRESSINGS) IMPLANT
DURAPREP 26ML APPLICATOR (WOUND CARE) ×2 IMPLANT
ELECT BLADE 4.0 EZ CLEAN MEGAD (MISCELLANEOUS) ×1
ELECT BLADE 6.5 EXT (BLADE) ×2 IMPLANT
ELECT CAUTERY BLADE 6.4 (BLADE) ×2 IMPLANT
ELECT PENCIL ROCKER SW 15FT (MISCELLANEOUS) ×2 IMPLANT
ELECT REM PT RETURN 9FT ADLT (ELECTROSURGICAL) ×1
ELECTRODE BLDE 4.0 EZ CLN MEGD (MISCELLANEOUS) IMPLANT
ELECTRODE REM PT RTRN 9FT ADLT (ELECTROSURGICAL) ×2 IMPLANT
EVACUATOR 1/8 PVC DRAIN (DRAIN) IMPLANT
GAUZE SPONGE 4X4 12PLY STRL (GAUZE/BANDAGES/DRESSINGS) ×2 IMPLANT
GLOVE BIO SURGEON STRL SZ 6.5 (GLOVE) ×2 IMPLANT
GLOVE BIOGEL PI IND STRL 6.5 (GLOVE) ×2 IMPLANT
GLOVE BIOGEL PI IND STRL 8.5 (GLOVE) ×2 IMPLANT
GLOVE SS BIOGEL STRL SZ 8 (GLOVE) ×2 IMPLANT
GLOVE SS BIOGEL STRL SZ 8.5 (GLOVE) ×2 IMPLANT
GOWN STRL REUS W/ TWL LRG LVL3 (GOWN DISPOSABLE) ×2 IMPLANT
GOWN STRL REUS W/TWL 2XL LVL3 (GOWN DISPOSABLE) ×4 IMPLANT
GOWN STRL REUS W/TWL LRG LVL3 (GOWN DISPOSABLE) ×1
KIT BASIN OR (CUSTOM PROCEDURE TRAY) ×2 IMPLANT
KIT POSITION SURG JACKSON T1 (MISCELLANEOUS) ×2 IMPLANT
KIT TURNOVER KIT B (KITS) ×2 IMPLANT
NDL 22X1.5 STRL (OR ONLY) (MISCELLANEOUS) ×2 IMPLANT
NEEDLE 22X1.5 STRL (OR ONLY) (MISCELLANEOUS) ×1 IMPLANT
NS IRRIG 1000ML POUR BTL (IV SOLUTION) ×2 IMPLANT
PACK LAMINECTOMY ORTHO (CUSTOM PROCEDURE TRAY) ×2 IMPLANT
PACK UNIVERSAL I (CUSTOM PROCEDURE TRAY) ×2 IMPLANT
PAD ARMBOARD 7.5X6 YLW CONV (MISCELLANEOUS) ×4 IMPLANT
PATTIES SURGICAL .5 X.5 (GAUZE/BANDAGES/DRESSINGS) IMPLANT
PATTIES SURGICAL .5 X1 (DISPOSABLE) ×2 IMPLANT
SPONGE SURGIFOAM ABS GEL 100 (HEMOSTASIS) IMPLANT
SPONGE T-LAP 4X18 ~~LOC~~+RFID (SPONGE) ×4 IMPLANT
STRIP CLOSURE SKIN 1/2X4 (GAUZE/BANDAGES/DRESSINGS) ×2 IMPLANT
SURGIFLO W/THROMBIN 8M KIT (HEMOSTASIS) IMPLANT
SUT BONE WAX W31G (SUTURE) ×1 IMPLANT
SUT ETHILON 3 0 FSL (SUTURE) IMPLANT
SUT MNCRL AB 3-0 PS2 27 (SUTURE) ×2 IMPLANT
SUT MNCRL+ AB 3-0 CT1 36 (SUTURE) IMPLANT
SUT PDS AB 1 CTX 36 (SUTURE) ×2 IMPLANT
SUT STRATAFIX 1PDS 45CM VIOLET (SUTURE) IMPLANT
SUT VIC AB 0 CT1 27 (SUTURE)
SUT VIC AB 0 CT1 27XBRD ANBCTR (SUTURE) ×4 IMPLANT
SUT VIC AB 1 CTX 36 (SUTURE)
SUT VIC AB 1 CTX36XBRD ANBCTR (SUTURE) ×4 IMPLANT
SUT VIC AB 2-0 CT1 18 (SUTURE) ×1 IMPLANT
SUT VIC AB 3-0 X1 27 (SUTURE) IMPLANT
SYR BULB IRRIG 60ML STRL (SYRINGE) ×1 IMPLANT
SYR CONTROL 10ML LL (SYRINGE) ×2 IMPLANT
TOWEL GREEN STERILE (TOWEL DISPOSABLE) ×2 IMPLANT
TOWEL GREEN STERILE FF (TOWEL DISPOSABLE) ×2 IMPLANT
TRAY FOLEY MTR SLVR 16FR STAT (SET/KITS/TRAYS/PACK) ×2 IMPLANT
WATER STERILE IRR 1000ML POUR (IV SOLUTION) ×2 IMPLANT
YANKAUER SUCT BULB TIP NO VENT (SUCTIONS) ×2 IMPLANT

## 2022-11-29 NOTE — Transfer of Care (Signed)
Immediate Anesthesia Transfer of Care Note  Patient: Tara [name]  Procedure(s) Performed: Removal of symptomatic hardware - pedicle screws (Spine Lumbar)  Patient Location: PACU  Anesthesia Type:General  Level of Consciousness: awake, alert , oriented, patient cooperative, and responds to stimulation  Airway & Oxygen Therapy: Patient Spontanous Breathing and Patient connected to face mask oxygen  Post-op Assessment: Report given to RN and Post -op Vital signs reviewed and stable  Post vital signs: Reviewed and stable  Last Vitals:  Vitals Value Taken Time  BP 128/41 11/29/22 1452  Temp    Pulse 79 11/29/22 1458  Resp 15 11/29/22 1458  SpO2 99 % 11/29/22 1458  Vitals shown include unfiled device data.      Complications: No notable events documented.

## 2022-11-29 NOTE — Discharge Instructions (Signed)
Removal of Hardware  Care After  This sheet gives you information about how to care for yourself after your procedure. Your health care provider may also give you more specific instructions. If you have problems or questions, contact your health care provider. What can I expect after the procedure? After the procedure, it is common to have: Some pain around your incision area. Muscle tightening (spasms) across the back.   Follow these instructions at home: Incision care Follow instructions from your health care provider about how to take care of your incision area. Make sure you: Wash your hands with soap and water before and after you apply medicine to the area or change your bandage (dressing). If soap and water are not available, use hand sanitizer. Change your dressing as told by your health care provider. Leave stitches (sutures), skin glue, or adhesive strips in place. These skin closures may need to stay in place for 2 weeks or longer. If adhesive strip edges start to loosen and curl up, you may trim the loose edges. Do not remove adhesive strips completely unless your health care provider tells you to do that.  Check your incision area every day for signs of infection. Check for: More redness, swelling, or pain. More fluid or blood. Warmth. Pus or a bad smell. Medicines Take over-the-counter and prescription medicines only as told by your health care provider. If you were prescribed an antibiotic medicine, use it as told by your health care provider. Do not stop using the antibiotic even if you start to feel better. If needed, call office in 3 days to request refill of pain medications. Bathing Do not take baths, swim, or use a hot tub for 6 weeks, or until your incision has healed completely. If your health care provider approves, you may take showers after your dressing has been removed. Ok to shower in 5 days Activity Return to your normal activities as told by your health  care provider. Ask your health care provider what activities are safe for you. Avoid bending or twisting at your waist. Always bend at your knees. Do not sit for more than 20-30 minutes at a time. Lie down or walk between periods of sitting. Do not lift anything that is heavier than 10 lb (4.5 kg) or the limit that your health care provider tells you, until he or she says that it is safe. Do not drive for 2 weeks after your procedure or for as long as your health care provider tells you.  Do not drive or use heavy machinery while taking prescription pain medicine. General instructions To prevent or treat constipation while you are taking prescription pain medicine, your health care provider may recommend that you: Drink enough fluid to keep your urine clear or pale yellow. Take over-the-counter or prescription medicines. Eat foods that are high in fiber, such as fresh fruits and vegetables, whole grains, and beans. Limit foods that are high in fat and processed sugars, such as fried and sweet foods. Do breathing exercises as told. Keep all follow-up visits as told by your health care provider. This is important. Contact a health care provider if: You have more redness, swelling, or pain around your incision area. Your incision feels warm to the touch. You are not able to return to activities or do exercises as told by your health care provider. Get help right away if: You have: More fluid or blood coming from your incision area. Pus or a bad smell coming from your incision area.  Chills or a fever. Episodes of dizziness or fainting while standing. You develop a rash. You develop shortness of breath or you have difficulty breathing. You cannot control when you urinate or have a bowel movement. You become weak. You are not able to use your legs. Summary After the procedure, it is common to have some pain around your incision area. You may also have muscle tightening (spasms) across the  back. Follow instructions from your health care provider about how to care for your incision. Do not lift anything that is heavier than 10 lb (4.5 kg) or the limit that your health care provider tells you, until he or she says that it is safe. Contact your health care provider if you have more redness, swelling, or pain around your incision area or if your incision feels warm to the touch. These can be signs of infection. This information is not intended to replace advice given to you by your health care provider. Make sure you discuss any questions you have with your health care provider. Refer to this sheet in the next few weeks. These instructions provide you with information about caring for yourself after your procedure. Your health care provider may also give you more specific instructions. Your treatment has been planned according to current medical practices, but problems sometimes occur. Call your health care provider if you have any problems or questions after your procedure. What can I expect after the procedure? It is common to have pain for the first few days after the procedure. Some people continue to have mild pain even after making a full recovery. Follow these instructions at home: Medicine Take medicines only as directed by your health care provider. Avoid taking over-the-counter pain medicines unless your health care provider tells you otherwise. These medicines interfere with the development and growth of new bone cells. If you were prescribed a narcotic pain medicine, take it exactly as told by your health care provider. Do not drink alcohol while on the medicine. Do not drive while on the medicine. Injury care Care for your back brace as told by your health care provider. If directed, apply ice to the injured area: Put ice in a plastic bag. Place a towel between your skin and the bag. Leave the ice on for 20 minutes, 2-3 times a day. Activity Perform physical therapy  exercises as told by your health care provider. Exercise regularly. Start by taking short walks. Slowly increase your activity level over time. Gentle exercise helps to ease pain. Sit, stand, walk, turn in bed, and reposition yourself as told by your health care provider. This will help to keep your spine in proper alignment. Avoid bending and twisting your body. Avoid doing strenuous household chores, such as vacuuming. Do not lift anything that is heavier than 10 lb (4.5 kg). Other Instructions Keep all follow-up visits as directed by your health care provider. This is important. Do not use any tobacco products, including cigarettes, chewing tobacco, or electronic cigarettes. If you need help quitting, ask your health care provider. Nicotine affects the way bones heal. Contact a health care provider if: Your pain gets worse. You have a fever. You have redness, swelling, or pain at the site of your incision. You have fluid, blood, or pus coming from your incision. You have numbness, tingling, or weakness in any part of your body. Get help right away if: Your incision feels swollen and tender, and the surrounding area looks like a lump. The lump may be red or  bluish in color. You cannot move any part of your body (paralysis). You cannot control your bladder or bowels.
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
## 2022-11-29 NOTE — Anesthesia Postprocedure Evaluation (Signed)
Anesthesia Post Note  Patient: Tara [name]  Procedure(s) Performed: Removal of symptomatic hardware - pedicle screws (Spine Lumbar)     Patient location during evaluation: PACU Anesthesia Type: General Level of consciousness: awake and alert Pain management: pain level controlled Vital Signs Assessment: post-procedure vital signs reviewed and stable Respiratory status: spontaneous breathing, nonlabored ventilation, respiratory function stable and patient connected to nasal cannula oxygen Cardiovascular status: blood pressure returned to baseline and stable Postop Assessment: no apparent nausea or vomiting Anesthetic complications: no  No notable events documented.  Last Vitals:  Vitals:   11/29/22 1500 11/29/22 1506  BP: (!) 126/59   Pulse: 75 75  Resp: 15 17  Temp:    SpO2: 99% 93%    Last Pain:  Vitals:   11/29/22 1506  PainSc: 6                  Lougenia Morrissey S

## 2022-11-29 NOTE — Op Note (Signed)
OPERATIVE REPORT  DATE OF SURGERY: 11/29/2022  PATIENT NAME:  [name] MRN: [id number] DOB: [birth year]  PCP: Alba Cory, MD  PRE-OPERATIVE DIAGNOSIS: Symptomatic lumbar pedicle screw hardware  POST-OPERATIVE DIAGNOSIS: Same  PROCEDURE:   Removal of posterior L5-S1 pedicle screw - rod construct  SURGEON:  Venita Lick, MD  PHYSICIAN ASSISTANT: None  ANESTHESIA:   General  EBL: 10 ml   Complications: None  Implants: Removed L5-S1 pedicle screws and connecting rod bilaterally.    BRIEF HISTORY: Tara [name] is a 57 y.o. female who had a previous anterior posterior L5-S1 fusion approximately 16 months ago.  Patient has done well until recently when she started having significant increased pain.  Despite appropriate conservative management she continues to have severe pain and loss in quality of life.  CT scan showed a solid L5-S1 fusion.  Pedicle screws were showing to be contacting the superior facet complex and could be causing irritation and increased pain.  As result she elected to move forward with removal of the posterior pedicle screw construct.  All appropriate risks, benefits, and alternatives were discussed with the patient and consent was obtained.  PROCEDURE DETAILS: Patient was brought into the operating room and was properly positioned on the operating room table.  After induction with general anesthesia the patient was endotracheally intubated.  A timeout was taken to confirm all important data: including patient, procedure, and the level. Teds, SCD's were applied.   Patient was placed prone on the Wilson frame and all bony prominences well-padded.  The back was prepped and draped in standard fashion.  The previous Wil[place]ie incisions were marked and infiltrated with quarter percent Marcaine with epinephrine.  The incisions were opened again and sharply dissected down to the deep fascia.  I incised the deep fascia and bluntly dissected down to the pedicle  screw rod construct.  I then placed a deep retractor so that I could visualize the instrumentation.  I then remove the locking caps, the rod, and both pedicle screws.  I irrigated this wound out copiously with normal saline.  I then went to the contralateral side and using the exact same technique remove the pedicle screw rod construct from the side.  At this point time both wounds were copiously irrigated with normal saline and I placed Floseal to aid in the hemostasis.  I then closed the deep fascia with a running #1 strata fix suture.  I then closed interfaith intermediate with 2-0 Vicryl suture.  I then infiltrated the incision site and the deep fascia with Exparel mixed with Marcaine for postoperative analgesia.  The skin was then closed with 3-0 Monocryl.  Steri-Strips dry dressings were applied and the patient was ultimately extubated transfer the PACU without incident.  The end of the case all needle sponge counts were correct.  There were no adverse intraoperative events.  Venita Lick, MD 11/29/2022 2:44 PM

## 2022-11-29 NOTE — Anesthesia Procedure Notes (Signed)
Procedure Name: Intubation Date/Time: 11/29/2022 1:29 PM  Performed by: Earlene Plater, CRNAPre-anesthesia Checklist: Patient identified, Emergency Drugs available, Suction available, Patient being monitored and Timeout performed Patient Re-evaluated:Patient Re-evaluated prior to induction Oxygen Delivery Method: Circle system utilized Preoxygenation: Pre-oxygenation with 100% oxygen Induction Type: IV induction Ventilation: Mask ventilation without difficulty Laryngoscope Size: Mac and 3 Grade View: Grade I Tube type: Oral Number of attempts: 1 Airway Equipment and Method: Stylet and Bite block Placement Confirmation: ETT inserted through vocal cords under direct vision, positive ETCO2, CO2 detector and breath sounds checked- equal and bilateral Secured at: 21 (@ lips) cm Tube secured with: Tape Dental Injury: Teeth and Oropharynx as per pre-operative assessment

## 2022-11-29 NOTE — Brief Op Note (Signed)
11/29/2022  2:52 PM  PATIENT:  Tara [name]  57 y.o. female  PRE-OPERATIVE DIAGNOSIS:  Symptomatic hardware  POST-OPERATIVE DIAGNOSIS:  Symptomatic hardware  PROCEDURE:  Procedure(s): Removal of symptomatic hardware - pedicle screws (N/A)  SURGEON:  Surgeons and Role:    Venita Lick, MD - Primary  PHYSICIAN ASSISTANT:   ASSISTANTS: none   ANESTHESIA:   general  EBL:  10 mL   BLOOD ADMINISTERED:none  DRAINS: none   LOCAL MEDICATIONS USED:  MARCAINE  and Exparel    SPECIMEN:  No Specimen  DISPOSITION OF SPECIMEN:  N/A  COUNTS:  YES  TOURNIQUET:  * No tourniquets in log *  DICTATION: .Dragon Dictation  PLAN OF CARE: Discharge to home after PACU  PATIENT DISPOSITION:  PACU - hemodynamically stable.

## 2022-11-30 ENCOUNTER — Encounter (HOSPITAL_COMMUNITY): Payer: Self-pay | Admitting: Orthopedic Surgery

## 2022-11-30 ENCOUNTER — Encounter: Payer: Self-pay | Admitting: Family Medicine
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
## 2022-12-03 ENCOUNTER — Telehealth: Payer: Self-pay | Admitting: *Deleted

## 2022-12-03 NOTE — Transitions of Care (Post Inpatient/ED Visit) (Signed)
12/03/2022  Name: Tara [name] MRN: [id number] DOB: 07-14-1965  Today's TOC FU Call Status: Today's TOC FU Call Status:: Unsuccessful Call (1st Attempt) Unsuccessful Call (1st Attempt) Date: 12/03/22  Attempted to reach the patient regarding the most recent Inpatient/ED visit.  Follow Up Plan: Additional outreach attempts will be made to reach the patient to complete the Transitions of Care (Post Inpatient/ED visit) call.   Gean Maidens BSN RN [facility] [phone]

## 2022-12-04 ENCOUNTER — Telehealth: Payer: Self-pay | Admitting: *Deleted

## 2022-12-04 NOTE — Transitions of Care (Post Inpatient/ED Visit) (Signed)
12/04/2022  Name: Tara [name] MRN: [id number] DOB: January 07, 1966  Today's TOC FU Call Status: Today's TOC FU Call Status:: Successful TOC FU Call Completed TOC FU Call Complete Date: 12/04/22 Patient's Name and Date of Birth confirmed.  Transition Care Management Follow-up Telephone Call Date of Discharge: 11/29/22 Discharge Facility: [facility]) Type of Discharge: Inpatient Admission Primary Inpatient Discharge Diagnosis:: Removal of Hardware How have you been since you were released from the hospital?: Better (bbut still in pain./ Medication is helping) Any questions or concerns?: No  Items Reviewed: Did you receive and understand the discharge instructions provided?: Yes Medications obtained,verified, and reconciled?: Yes (Medications Reviewed) Any new allergies since your discharge?: No Dietary orders reviewed?: No Do you have support at home?: Yes People in Home: significant other Name of Support/Comfort Primary Source: Kathlene November  Medications Reviewed Today: Medications Reviewed Today     Reviewed by Luella Cook, RN (Case Manager) on 12/04/22 at 1117  Med List Status: <None>   Medication Order Taking? Sig Documenting Provider Last Dose Status Informant  CALCIUM PO [id number] Yes Take 1,500 mg by mouth daily. [provider] Taking Active Self  Eszopiclone 3 MG TABS [id number] Yes Take 1 tablet (3 mg total) by mouth at bedtime. Take immediately before bedtime Alba Cory, MD Taking Active Self  folic acid (FOLVITE) 400 MCG tablet [id number] Yes Take 400 mcg by mouth daily. [provider] Taking Active Self  rosuvastatin (CRESTOR) 10 MG tablet [id number] Yes TAKE 1 TABLET(10 MG) BY MOUTH DAILY Carlynn Purl, Danna Hefty, MD Taking Active Self  venlafaxine XR (EFFEXOR-XR) 150 MG 24 hr capsule [id number] Yes Take 1 capsule (150 mg total) by mouth daily with breakfast. TAKE 1 CAPSULE(150 MG) BY MOUTH DAILY WITH Iona Coach, Danna Hefty, MD Taking Active Self   vitamin B-12 (CYANOCOBALAMIN) 100 MCG tablet [id number] Yes Take 100 mcg by mouth 3 (three) times a week. [provider] Taking Active Self  VITAMIN D-VITAMIN K PO [id number] Yes Take 1 tablet by mouth daily. [provider] Taking Active Self            Home Care and Equipment/Supplies: Were Home Health Services Ordered?: NA Any new equipment or medical supplies ordered?: NA  Functional Questionnaire: Do you need assistance with bathing/showering or dressing?: Yes Do you need assistance with meal preparation?: Yes Do you need assistance with eating?: No Do you have difficulty maintaining continence: No Do you need assistance with getting out of bed/getting out of a chair/moving?: No Do you have difficulty managing or taking your medications?: No  Follow up appointments reviewed: PCP Follow-up appointment confirmed?: NA Specialist Hospital Follow-up appointment confirmed?: Yes Date of Specialist follow-up appointment?: 12/12/22 Follow-Up Specialty Provider:: Dr Shon Baton Do you need transportation to your follow-up appointment?: No Do you understand care options if your condition(s) worsen?: Yes-patient verbalized understanding  SDOH Interventions Today    Flowsheet Row Most Recent Value  SDOH Interventions   Food Insecurity Interventions Intervention Not Indicated  Housing Interventions Intervention Not Indicated  Transportation Interventions Intervention Not Indicated, Patient Resources (Friends/Family)  Utilities Interventions Intervention Not Indicated      Interventions Today    Flowsheet Row Most Recent Value  General Interventions   General Interventions Discussed/Reviewed General Interventions Discussed, General Interventions Reviewed, Doctor Visits, Referral to Nurse  [Declined care coordination]  Doctor Visits Discussed/Reviewed Doctor Visits Discussed, Doctor Visits Reviewed  Mental Health Interventions   Mental Health Discussed/Reviewed  Mental Health Discussed  [patient declined Social worker services]  Pharmacy Interventions  Pharmacy Dicussed/Reviewed Pharmacy Topics Discussed      TOC Interventions Today    Flowsheet Row Most Recent Value  TOC Interventions   TOC Interventions Discussed/Reviewed TOC Interventions Discussed, TOC Interventions Reviewed        Gean Maidens BSN RN [facility] [phone]

## 2022-12-13 DIAGNOSIS — R944 Abnormal results of kidney function studies: Secondary | ICD-10-CM | POA: Diagnosis not present

## 2022-12-14 LAB — BASIC METABOLIC PANEL WITH GFR
BUN: 19 mg/dL (ref 7–25)
CO2: 29 mmol/L (ref 20–32)
Calcium: 10 mg/dL (ref 8.6–10.4)
Chloride: 99 mmol/L (ref 98–110)
Creat: 0.75 mg/dL (ref 0.50–1.03)
Glucose, Bld: 105 mg/dL — ABNORMAL HIGH (ref 65–99)
Potassium: 5.3 mmol/L (ref 3.5–5.3)
Sodium: 136 mmol/L (ref 135–146)
eGFR: 92 mL/min/{1.73_m2} (ref >=60)

## 2023-01-01 ENCOUNTER — Encounter: Payer: Self-pay | Admitting: Family Medicine

## 2023-01-14 ENCOUNTER — Encounter: Payer: Self-pay | Admitting: Family Medicine

## 2023-01-14 ENCOUNTER — Telehealth: Payer: Self-pay | Admitting: Gastroenterology

## 2023-01-14 NOTE — Telephone Encounter (Signed)
The patient called in to schedule an appointment. I sent out an appointment reminder with a No-Show letter and a map. I verified and updated the information in her chart, including the insurance guarantor and other relevant details.

## 2023-01-19 ENCOUNTER — Other Ambulatory Visit: Payer: Self-pay | Admitting: Family Medicine

## 2023-01-19 DIAGNOSIS — F5104 Psychophysiologic insomnia: Secondary | ICD-10-CM

## 2023-01-21 ENCOUNTER — Other Ambulatory Visit: Payer: Self-pay

## 2023-01-22 ENCOUNTER — Ambulatory Visit: Payer: Medicare PPO | Admitting: Gastroenterology
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
## 2023-01-24 ENCOUNTER — Ambulatory Visit: Payer: Medicare PPO | Admitting: Family Medicine

## 2023-01-29 ENCOUNTER — Telehealth: Payer: Self-pay | Admitting: Gastroenterology

## 2023-01-29 NOTE — Telephone Encounter (Signed)
The patient called in to reschedule her appointment with Dr.Vanga. I did inform her that her first available would be in January, and she can see the PA.

## 2023-02-04 NOTE — Progress Notes (Unsigned)
Name: Tara [name]   MRN: [id number]    DOB: 05-19-1965   Date:02/05/2023       Progress Note  Subjective  Chief Complaint  Follow Up  HPI  Discussed the use of AI scribe software for clinical note transcription with the patient, who gave verbal consent to proceed.  History of Present Illness   The patient, with a history of chronic back pain, osteopenia, and emphysema, presents for a six-month follow-up. She reports a recent surgery in September to remove symptomatic hardware from her back due to her bones being too soft to hold the screws. The patient notes that the pain in her back has significantly improved since the surgery, with only some discomfort remaining, which is dependent on her activities.  The patient also reports experiencing hot flashes, which have been occurring for the last three to four months. She describes these episodes as sudden and intense, causing her to feel excessively hot and sweaty. The patient expresses concern about these symptoms, questioning if they could be related to hormonal changes or menopause.  In addition to these physical symptoms, the patient is experiencing significant emotional distress. She describes feeling hopeless and overwhelmed, with her mood being significantly affected by the current political climate. The patient also reports poor sleep quality and constant tiredness.  The patient's mother recently underwent knee surgery and has been unwell since, causing further stress. She also expresses concern for her niece who recently came out as transgender, worrying about her safety.  The patient acknowledges a decrease in her physical activity levels due to chronic back pain. She notes shortness of breath when getting up to move but reports that this symptom subsides with continued movement. The patient denies any wheezing or coughing related to her emphysema.  The patient is currently on Effexor for hot flashes and Lunesta for insomnia. She  has stopped taking Lyrica since her surgery and reports constipation when taking cyclobenzaprine for back spasms. She continues to take rosuvastatin for plaque formation related to thoracic atherosclerosis. The patient denies any easy bruising, a symptom she had previously experienced.      LABS A1c: 5.7% (08/2022) CBC: Normal (12/30/2022) Thyroid function: Normal (08/2022) Vitamin D: Normal (08/2022) Liver enzymes: Normal (08/2022)   Patient Active Problem List   Diagnosis Date Noted   Painful orthopaedic hardware (HCC) 11/29/2022   Hiatal hernia 03/27/2022   Osteopenia after menopause 02/15/2022   Gastroesophageal reflux disease    Centrilobular emphysema (HCC) 11/10/2021   Chronic insomnia 11/10/2021   S/P lumbar fusion 07/27/2021   Thoracic aorta atherosclerosis (HCC) 05/26/2021   Complex regional pain syndrome type 1 of right upper extremity 10/16/2019   Gastric erythema 07/24/2017   Purpura, nonthrombocytopenic (HCC) 05/17/2017   Fatty liver 01/28/2015   Barrett esophagus 09/10/2014   Major depression, recurrent, chronic (HCC) 09/08/2014   Insomnia, persistent 09/08/2014   Arthralgia, sacroiliac 09/08/2014   Degeneration of lumbar or lumbosacral intervertebral disc 09/08/2014   Gastro-esophageal reflux disease without esophagitis 09/08/2014   Perennial allergic rhinitis 09/08/2014   Chronic pain 01/25/2011    Past Surgical History:  Procedure Laterality Date   ABDOMINAL EXPOSURE N/A 07/27/2021   Procedure: ABDOMINAL EXPOSURE FOR ANTERIOR SPINE SURGERY;  Surgeon: Leonie Douglas, MD;  Location: [facility] OR;  Service: Vascular;  Laterality: N/A;   ABDOMINAL HYSTERECTOMY  2005   partial   ANTERIOR AND POSTERIOR SPINAL FUSION N/A 07/27/2021   Procedure: ANTERIOR LUMBAR INTERBODY FUSION AND POSTERIOR SPINAL FUSION WITH PEDICLE SCREWS LUMBAR FIVE TO SACRAL  ONE;  Surgeon: Venita Lick, MD;  Location: [facility] OR;  Service: Orthopedics;  Laterality: N/A;  4.5 hrs Dr. Lenell Antu to do  approach left tap block with exparel 3 C-Bed   CERVICAL DISC ARTHROPLASTY  2008   ESOPHAGOGASTRODUODENOSCOPY (EGD) WITH PROPOFOL N/A 06/04/2017   Procedure: ESOPHAGOGASTRODUODENOSCOPY (EGD) WITH PROPOFOL;  Surgeon: Christena Deem, MD;  Location: [facility] ENDOSCOPY;  Service: Endoscopy;  Laterality: N/A;   ESOPHAGOGASTRODUODENOSCOPY (EGD) WITH PROPOFOL N/A 12/21/2021   Procedure: ESOPHAGOGASTRODUODENOSCOPY (EGD) WITH PROPOFOL;  Surgeon: Toney Reil, MD;  Location: [facility] ENDOSCOPY;  Service: Gastroenterology;  Laterality: N/A;   HARDWARE REMOVAL N/A 11/29/2022   Procedure: Removal of symptomatic hardware - pedicle screws;  Surgeon: Venita Lick, MD;  Location: [facility] OR;  Service: Orthopedics;  Laterality: N/A;   INSERTION OF MESH  03/27/2022   Procedure: INSERTION OF MESH;  Surgeon: Leafy Ro, MD;  Location: [facility] ORS;  Service: General;;   KNEE ARTHROSCOPY Right 2008   LUMBAR LAMINECTOMY/DECOMPRESSION MICRODISCECTOMY  01/25/2011   Procedure: LUMBAR LAMINECTOMY/DECOMPRESSION MICRODISCECTOMY;  Surgeon: Alvy Beal;  Location: [facility] OR;  Service: Orthopedics;  Laterality: Left;  LEFT L5-S1 MICRODISCECTOMY, Central Decompression Lumbar five-sacral one   OOPHORECTOMY Left 2011   OOPHORECTOMY Right 10/2012   SPINAL CORD STIMULATOR IMPLANT  2009   Removal-01/2012   SPINAL CORD STIMULATOR REMOVAL  01/17/2012   Procedure: LUMBAR SPINAL CORD STIMULATOR REMOVAL;  Surgeon: Venita Lick, MD;  Location: [facility] OR;  Service: Orthopedics;  Laterality: N/A;  Spinal Cord Battery Removal   TUBAL LIGATION  1988   XI ROBOTIC ASSISTED PARAESOPHAGEAL HERNIA REPAIR N/A 03/27/2022   Procedure: XI ROBOTIC ASSISTED PARAESOPHAGEAL HERNIA REPAIR, RNFA to assist;  Surgeon: Leafy Ro, MD;  Location: [facility] ORS;  Service: General;  Laterality: N/A;    Family History  Problem Relation Age of Onset   Depression Mother    Ovarian cancer Mother    Hypertension Mother    Hypercholesterolemia Mother    Obesity Mother     Osteoarthritis Mother    Hypertension Sister    Obesity Sister    Stroke Sister    Healthy Sister    Healthy Daughter    Tongue cancer Daughter    Healthy Daughter    Breast cancer Neg Hx     Social History   Tobacco Use   Smoking status: Former    Current packs/day: 0.00    Average packs/day: 1 pack/day for 30.0 years (30.0 ttl pk-yrs)    Types: Cigarettes    Start date: 09/10/1986    Quit date: 2018    Years since quitting: 6.9   Smokeless tobacco: Former  Substance Use Topics   Alcohol use: No    Alcohol/week: 0.0 standard drinks of alcohol     Current Outpatient Medications:    CALCIUM PO, Take 1,500 mg by mouth daily., Disp: , Rfl:    cyclobenzaprine (FLEXERIL) 10 MG tablet, Take 10 mg by mouth 3 (three) times daily., Disp: , Rfl:    Eszopiclone 3 MG TABS, Take 1 tablet (3 mg total) by mouth at bedtime. Take immediately before bedtime, Disp: 90 tablet, Rfl: 1   folic acid (FOLVITE) 400 MCG tablet, Take 400 mcg by mouth daily., Disp: , Rfl:    pregabalin (LYRICA) 75 MG capsule, Take 150 mg by mouth 2 (two) times daily., Disp: , Rfl:    rosuvastatin (CRESTOR) 10 MG tablet, TAKE 1 TABLET(10 MG) BY MOUTH DAILY, Disp: 90 tablet, Rfl: 3   TRELEGY ELLIPTA 100-62.5-25 MCG/ACT AEPB, Inhale  1 puff into the lungs daily., Disp: , Rfl:    venlafaxine XR (EFFEXOR-XR) 150 MG 24 hr capsule, Take 1 capsule (150 mg total) by mouth daily with breakfast. TAKE 1 CAPSULE(150 MG) BY MOUTH DAILY WITH BREAKFAST, Disp: 90 capsule, Rfl: 1   vitamin B-12 (CYANOCOBALAMIN) 100 MCG tablet, Take 100 mcg by mouth 3 (three) times a week., Disp: , Rfl:    VITAMIN D-VITAMIN K PO, Take 1 tablet by mouth daily., Disp: , Rfl:    ZOFRAN 4 MG tablet, Take 4 mg by mouth every 8 (eight) hours as needed for refractory nausea / vomiting., Disp: , Rfl:   Allergies  Allergen Reactions   Clonidine Derivatives Itching    I personally reviewed active problem list, medication list, allergies, family history,  social history, health maintenance with the patient/caregiver today.   ROS  Ten systems reviewed and is negative except as mentioned in HPI    Objective  Vitals:   02/05/23 1042  BP: 132/70  Pulse: 98  Resp: 16  SpO2: 98%  Weight: 155 lb (70.3 kg)  Height: 5\' 8"  (1.727 m)    Body mass index is 23.57 kg/m.  Physical Exam  Constitutional: Patient appears well-developed and well-nourished. No distress.  HEENT: head atraumatic, normocephalic, pupils equal and reactive to light, neck supple Cardiovascular: Normal rate, regular rhythm and normal heart sounds.  No murmur heard. No BLE edema. Pulmonary/Chest: Effort normal and breath sounds normal. No respiratory distress. Abdominal: Soft.  There is no tenderness. Psychiatric: Patient has a normal mood and affect. behavior is normal. Judgment and thought content normal.    PHQ2/9:    02/05/2023   11:03 AM 02/05/2023   10:41 AM 11/16/2022    9:02 AM 08/20/2022   11:47 AM 07/19/2022   11:33 AM  Depression screen PHQ 2/9  Decreased Interest 3 0 1 0 0  Down, Depressed, Hopeless 3 0 1 0 0  PHQ - 2 Score 6 0 2 0 0  Altered sleeping 3 0 1 0   Tired, decreased energy 3 0 1 0   Change in appetite  0 1 0   Feeling bad or failure about yourself  0 0 0 0   Trouble concentrating 0 0 1 0   Moving slowly or fidgety/restless 0 0 1 0   Suicidal thoughts 0 0 0 0   PHQ-9 Score 12 0 7 0   Difficult doing work/chores Very difficult  Somewhat difficult Not difficult at all     phq 9 is positive   Fall Risk:    02/05/2023   10:41 AM 11/16/2022    9:02 AM 08/20/2022   11:46 AM 07/18/2022   12:45 PM 02/15/2022   11:16 AM  Fall Risk   Falls in the past year? 0 0 0 0 0  Number falls in past yr: 0  0 0 0  Injury with Fall? 0  0 0 0  Risk for fall due to : No Fall Risks Orthopedic patient   No Fall Risks  Follow up Falls prevention discussed Falls prevention discussed;Education provided;Falls evaluation completed   Falls prevention discussed       Functional Status Survey: Is the patient deaf or have difficulty hearing?: No Does the patient have difficulty seeing, even when wearing glasses/contacts?: No Does the patient have difficulty concentrating, remembering, or making decisions?: No Does the patient have difficulty walking or climbing stairs?: No Does the patient have difficulty dressing or bathing?: No Does the patient have difficulty doing  errands alone such as visiting a doctor's office or shopping?: No    Assessment & Plan  Assessment and Plan    Post-operative back pain Residual discomfort following hardware removal due to pedicle screw loosening. No current radicular symptoms. -We will stop Flexeril due to constipation and try  tizanidine as needed for back spasms.  Osteopenia Last bone density scan a year ago showed osteopenia. Patient has a history of GERD and hiatal hernia repair, making alendronate a less desirable option due to risk of esophagitis. -Start estradiol 0.5mg  for bone loss and hot flashes.  Major Depressive Disorder Patient reports feeling overwhelmed, hopeless, and has poor sleep. No current suicidal ideation. -Continue Effexor at current dose. -Add Vraylar for mood stabilization, pending insurance approval. -Encourage seeking therapy and supportive community.  Hot flashes Patient reports recent onset of hot flashes. -Start estradiol 0.5mg  for hot flashes and bone loss.  Chronic Obstructive Pulmonary Disease (COPD) Patient reports shortness of breath with exertion, improving with continued activity. -Encourage increased physical activity. -Continue as-needed use of Trelegy.  Hyperlipidemia Patient is currently on rosuvastatin for plaque formation in thoracic aorta. -Continue rosuvastatin.  Insomnia Patient reports poor sleep, likely related to current stressors. -Continue Lunesta.  Follow-up in 3 months to reassess mood, hot flashes, and response to new medications. If any  prescribed medications are too expensive or inaccessible, patient is advised to inform the office for alternative options.

## 2023-02-05 ENCOUNTER — Ambulatory Visit: Payer: Medicare PPO | Admitting: Family Medicine

## 2023-02-05 ENCOUNTER — Encounter: Payer: Self-pay | Admitting: Family Medicine

## 2023-02-05 VITALS — BP 132/70 | HR 98 | Resp 16 | Ht 68.0 in | Wt 155.0 lb

## 2023-02-05 DIAGNOSIS — M8580 Other specified disorders of bone density and structure, unspecified site: Secondary | ICD-10-CM

## 2023-02-05 DIAGNOSIS — N951 Menopausal and female climacteric states: Secondary | ICD-10-CM | POA: Diagnosis not present

## 2023-02-05 DIAGNOSIS — M545 Low back pain, unspecified: Secondary | ICD-10-CM

## 2023-02-05 DIAGNOSIS — F331 Major depressive disorder, recurrent, moderate: Secondary | ICD-10-CM | POA: Diagnosis not present

## 2023-02-05 DIAGNOSIS — J449 Chronic obstructive pulmonary disease, unspecified: Secondary | ICD-10-CM

## 2023-02-05 DIAGNOSIS — G8929 Other chronic pain: Secondary | ICD-10-CM | POA: Diagnosis not present

## 2023-02-05 DIAGNOSIS — Z780 Asymptomatic menopausal state: Secondary | ICD-10-CM

## 2023-02-05 DIAGNOSIS — I700 Atherosclerosis of aorta: Secondary | ICD-10-CM

## 2023-02-05 DIAGNOSIS — K2270 Barrett's esophagus without dysplasia: Secondary | ICD-10-CM

## 2023-02-05 DIAGNOSIS — F5104 Psychophysiologic insomnia: Secondary | ICD-10-CM

## 2023-02-05 MED ORDER — VENLAFAXINE HCL ER 150 MG PO CP24
150.0000 mg | ORAL_CAPSULE | Freq: Every day | ORAL | 0 refills | Status: DC
Start: 2023-02-05 — End: 2023-05-21

## 2023-02-05 MED ORDER — ESTRADIOL 0.5 MG PO TABS
0.5000 mg | ORAL_TABLET | Freq: Every day | ORAL | 0 refills | Status: DC
Start: 2023-02-05 — End: 2023-05-21

## 2023-02-05 MED ORDER — TIZANIDINE HCL 4 MG PO TABS
4.0000 mg | ORAL_TABLET | Freq: Three times a day (TID) | ORAL | 0 refills | Status: DC | PRN
Start: 2023-02-05 — End: 2023-05-21

## 2023-02-05 MED ORDER — CARIPRAZINE HCL 1.5 MG PO CAPS
1.5000 mg | ORAL_CAPSULE | Freq: Every day | ORAL | 0 refills | Status: DC
Start: 2023-02-05 — End: 2023-05-21

## 2023-02-05 MED ORDER — ESZOPICLONE 3 MG PO TABS
3.0000 mg | ORAL_TABLET | Freq: Every day | ORAL | 0 refills | Status: DC
Start: 2023-02-05 — End: 2023-05-21

## 2023-02-18 ENCOUNTER — Encounter: Payer: Self-pay | Admitting: Family Medicine

## 2023-02-18 ENCOUNTER — Ambulatory Visit: Payer: Medicare PPO | Admitting: Family Medicine

## 2023-02-28 ENCOUNTER — Ambulatory Visit: Payer: Medicare PPO | Admitting: Physician Assistant

## 2023-04-15 ENCOUNTER — Other Ambulatory Visit: Payer: Self-pay

## 2023-04-15 ENCOUNTER — Encounter: Payer: Self-pay | Admitting: Family Medicine

## 2023-04-16 ENCOUNTER — Other Ambulatory Visit: Payer: Self-pay | Admitting: Family Medicine

## 2023-04-16 MED ORDER — TRELEGY ELLIPTA 100-62.5-25 MCG/ACT IN AEPB: 1.0000 | INHALATION_SPRAY | Freq: Every day | RESPIRATORY_TRACT | 1 refills | Status: DC

## 2023-04-23 DIAGNOSIS — M5416 Radiculopathy, lumbar region: Secondary | ICD-10-CM | POA: Diagnosis not present

## 2023-04-25 ENCOUNTER — Other Ambulatory Visit: Payer: Self-pay | Admitting: Physical Medicine and Rehabilitation

## 2023-04-25 DIAGNOSIS — M5416 Radiculopathy, lumbar region: Secondary | ICD-10-CM

## 2023-05-06 NOTE — Discharge Instructions (Signed)

## 2023-05-07 ENCOUNTER — Ambulatory Visit
Admission: RE | Admit: 2023-05-07 | Discharge: 2023-05-07 | Disposition: A | Discharge status: Home / Self Care | Payer: Medicare PPO | Source: Ambulatory Visit | Attending: Physical Medicine and Rehabilitation | Admitting: Physical Medicine and Rehabilitation

## 2023-05-07 DIAGNOSIS — Z981 Arthrodesis status: Secondary | ICD-10-CM | POA: Diagnosis not present

## 2023-05-07 DIAGNOSIS — M5416 Radiculopathy, lumbar region: Secondary | ICD-10-CM

## 2023-05-07 DIAGNOSIS — M4726 Other spondylosis with radiculopathy, lumbar region: Secondary | ICD-10-CM | POA: Diagnosis not present

## 2023-05-07 DIAGNOSIS — M4316 Spondylolisthesis, lumbar region: Secondary | ICD-10-CM | POA: Diagnosis not present

## 2023-05-07 DIAGNOSIS — M48061 Spinal stenosis, lumbar region without neurogenic claudication: Secondary | ICD-10-CM | POA: Diagnosis not present

## 2023-05-07 MED ORDER — DIAZEPAM 5 MG PO TABS: 10.0000 mg | ORAL_TABLET | Freq: Once | ORAL | Status: DC

## 2023-05-07 MED ORDER — IOPAMIDOL (ISOVUE-M 200) INJECTION 41%
20.0000 mL | Freq: Once | INTRAMUSCULAR | Status: AC
  Administered 2023-05-07: 20 mL via INTRATHECAL

## 2023-05-07 MED ORDER — MEPERIDINE HCL 50 MG/ML IJ SOLN: 50.0000 mg | Freq: Once | INTRAMUSCULAR | Status: DC | PRN

## 2023-05-07 MED ORDER — ONDANSETRON HCL 4 MG/2ML IJ SOLN: 4.0000 mg | Freq: Once | INTRAMUSCULAR | Status: DC | PRN

## 2023-05-15 ENCOUNTER — Encounter: Payer: Self-pay | Admitting: Family Medicine

## 2023-05-15 ENCOUNTER — Other Ambulatory Visit: Payer: Self-pay | Admitting: Family Medicine

## 2023-05-15 DIAGNOSIS — F5104 Psychophysiologic insomnia: Secondary | ICD-10-CM

## 2023-05-15 NOTE — Telephone Encounter (Signed)
 Last f/u 01/2023

## 2023-05-15 NOTE — Progress Notes (Deleted)
   There were no vitals taken for this visit.   Subjective:    Patient ID: Tara [name], female    DOB: June 16, 1965, 58 y.o.   MRN: [id number]  HPI: Tara [name] is a 58 y.o. female  No chief complaint on file.   Discussed the use of AI scribe software for clinical note transcription with the patient, who gave verbal consent to proceed.  History of Present Illness           02/05/2023   11:03 AM 02/05/2023   10:41 AM 11/16/2022    9:02 AM  Depression screen PHQ 2/9  Decreased Interest 3 0 1  Down, Depressed, Hopeless 3 0 1  PHQ - 2 Score 6 0 2  Altered sleeping 3 0 1  Tired, decreased energy 3 0 1  Change in appetite  0 1  Feeling bad or failure about yourself  0 0 0  Trouble concentrating 0 0 1  Moving slowly or fidgety/restless 0 0 1  Suicidal thoughts 0 0 0  PHQ-9 Score 12 0 7  Difficult doing work/chores Very difficult  Somewhat difficult    Relevant past medical, surgical, family and social history reviewed and updated as indicated. Interim medical history since our last visit reviewed. Allergies and medications reviewed and updated.  Review of Systems  Per HPI unless specifically indicated above     Objective:    There were no vitals taken for this visit.  {Vitals History (Optional):23777} Wt Readings from Last 3 Encounters:  02/05/23 155 lb (70.3 kg)  11/29/22 155 lb (70.3 kg)  11/27/22 155 lb 3.2 oz (70.4 kg)    Physical Exam  Results for orders placed or performed during the hospital encounter of 11/27/22  CBC   Collection Time: 11/27/22  3:30 PM  Result Value Ref Range   WBC 8.8 4.0 - 10.5 K/uL   RBC 4.26 3.87 - 5.11 MIL/uL   Hemoglobin 13.7 12.0 - 15.0 g/dL   HCT 36.6 44.0 - 34.7 %   MCV 95.5 80.0 - 100.0 fL   MCH 32.2 26.0 - 34.0 pg   MCHC 33.7 30.0 - 36.0 g/dL   RDW 42.5 95.6 - 38.7 %   Platelets 385 150 - 400 K/uL   nRBC 0.0 0.0 - 0.2 %  Basic metabolic panel   Collection Time: 11/27/22  3:30 PM  Result Value Ref Range   Sodium  131 (L) 135 - 145 mmol/L   Potassium 4.7 3.5 - 5.1 mmol/L   Chloride 93 (L) 98 - 111 mmol/L   CO2 28 22 - 32 mmol/L   Glucose, Bld 92 70 - 99 mg/dL   BUN 18 6 - 20 mg/dL   Creatinine, Ser 5.64 0.44 - 1.00 mg/dL   Calcium 9.6 8.9 - 33.2 mg/dL   GFR, Estimated >95 >18 mL/min   Anion gap 10 5 - 15  Surgical pcr screen   Collection Time: 11/27/22  3:41 PM   Specimen: Nasal Mucosa; Nasal Swab  Result Value Ref Range   MRSA, PCR NEGATIVE NEGATIVE   Staphylococcus aureus NEGATIVE NEGATIVE   {Labs (Optional):23779}    Assessment & Plan:   Problem List Items Addressed This Visit   None    Assessment and Plan             Follow up plan: No follow-ups on file.

## 2023-05-16 ENCOUNTER — Emergency Department
Admission: EM | Admit: 2023-05-16 | Discharge: 2023-05-16 | Disposition: A | Discharge status: Home / Self Care | Attending: Emergency Medicine | Admitting: Emergency Medicine

## 2023-05-16 ENCOUNTER — Other Ambulatory Visit: Payer: Self-pay

## 2023-05-16 ENCOUNTER — Emergency Department

## 2023-05-16 ENCOUNTER — Ambulatory Visit: Admitting: Nurse Practitioner

## 2023-05-16 DIAGNOSIS — M546 Pain in thoracic spine: Secondary | ICD-10-CM | POA: Insufficient documentation

## 2023-05-16 DIAGNOSIS — G8929 Other chronic pain: Secondary | ICD-10-CM | POA: Diagnosis not present

## 2023-05-16 DIAGNOSIS — G8918 Other acute postprocedural pain: Secondary | ICD-10-CM | POA: Insufficient documentation

## 2023-05-16 LAB — CBC WITH DIFFERENTIAL/PLATELET
Abs Immature Granulocytes: 0.03 10*3/uL (ref 0.00–0.07)
Basophils Absolute: 0.1 10*3/uL (ref 0.0–0.1)
Basophils Relative: 1 %
Eosinophils Absolute: 0.9 10*3/uL — ABNORMAL HIGH (ref 0.0–0.5)
Eosinophils Relative: 10 %
HCT: 41 % (ref 36.0–46.0)
Hemoglobin: 13.9 g/dL (ref 12.0–15.0)
Immature Granulocytes: 0 %
Lymphocytes Relative: 28 %
Lymphs Abs: 2.6 10*3/uL (ref 0.7–4.0)
MCH: 31.7 pg (ref 26.0–34.0)
MCHC: 33.9 g/dL (ref 30.0–36.0)
MCV: 93.6 fL (ref 80.0–100.0)
Monocytes Absolute: 0.8 10*3/uL (ref 0.1–1.0)
Monocytes Relative: 8 %
Neutro Abs: 5 10*3/uL (ref 1.7–7.7)
Neutrophils Relative %: 53 %
Platelets: 301 10*3/uL (ref 150–400)
RBC: 4.38 MIL/uL (ref 3.87–5.11)
RDW: 12.5 % (ref 11.5–15.5)
WBC: 9.4 10*3/uL (ref 4.0–10.5)
nRBC: 0 % (ref 0.0–0.2)

## 2023-05-16 LAB — BASIC METABOLIC PANEL
Anion gap: 9 (ref 5–15)
BUN: 15 mg/dL (ref 6–20)
CO2: 25 mmol/L (ref 22–32)
Calcium: 8.9 mg/dL (ref 8.9–10.3)
Chloride: 100 mmol/L (ref 98–111)
Creatinine, Ser: 0.86 mg/dL (ref 0.44–1.00)
GFR, Estimated: >60 mL/min (ref >60)
Glucose, Bld: 129 mg/dL — ABNORMAL HIGH (ref 70–99)
Potassium: 4.4 mmol/L (ref 3.5–5.1)
Sodium: 134 mmol/L — ABNORMAL LOW (ref 135–145)

## 2023-05-16 MED ORDER — KETOROLAC TROMETHAMINE 60 MG/2ML IM SOLN
30.0000 mg | Freq: Once | INTRAMUSCULAR | Status: AC
  Administered 2023-05-16: 30 mg via INTRAMUSCULAR
  Filled 2023-05-16: qty 2

## 2023-05-16 MED ORDER — DEXAMETHASONE 4 MG PO TABS
10.0000 mg | ORAL_TABLET | Freq: Once | ORAL | Status: AC
  Administered 2023-05-16: 10 mg via ORAL
  Filled 2023-05-16: qty 3

## 2023-05-16 MED ORDER — OXYCODONE-ACETAMINOPHEN 5-325 MG PO TABS
1.0000 | ORAL_TABLET | Freq: Once | ORAL | Status: AC
  Administered 2023-05-16: 1 via ORAL
  Filled 2023-05-16: qty 1

## 2023-05-16 NOTE — ED Provider Notes (Signed)
 [facility] Provider Note    Event Date/Time   First MD Initiated Contact with Patient 05/16/23 1321     (approximate)   History   Back Pain   HPI Tara [name] is a 58 y.o. female with history of depression, emphysema, HLD presenting today for back pain.  Patient states having a history of 2 months of mid back pain.  She states 2 months ago she was lifting something and felt acute onset pain in her thoracic region.  She has been followed by orthopedic team outpatient for these pain symptoms.  They have previously gotten a CT myelogram of her lumbar spine but no imaging of her thoracic or neck region.  She also intermittently notes some tingling in her right arm which is not currently present.  No other numbness or tingling in her legs.  No loss of bowel or bladder.  No other recent traumatic injury to the back.  No fevers.     Physical Exam   Triage Vital Signs: ED Triage Vitals  Encounter Vitals Group     BP 05/16/23 1138 (!) 143/75     Systolic BP Percentile --      Diastolic BP Percentile --      Pulse Rate 05/16/23 1138 100     Resp 05/16/23 1138 17     Temp 05/16/23 1138 98.3 F (36.8 C)     Temp Source 05/16/23 1138 Oral     SpO2 05/16/23 1138 100 %     Weight 05/16/23 1139 155 lb (70.3 kg)     Height 05/16/23 1139 5\' 8"  (1.727 m)     Head Circumference --      Peak Flow --      Pain Score 05/16/23 1143 10     Pain Loc --      Pain Education --      Exclude from Growth Chart --     Most recent vital signs: Vitals:   05/16/23 1138  BP: (!) 143/75  Pulse: 100  Resp: 17  Temp: 98.3 F (36.8 C)  SpO2: 100%   Physical Exam: I have reviewed the vital signs and nursing notes. General: Awake, alert, no acute distress.  Nontoxic appearing. Head:  Atraumatic, normocephalic.   ENT:  EOM intact, PERRL. Oral mucosa is pink and moist with no lesions. Neck: Neck is supple with full range of motion, No meningeal signs. Cardiovascular:   RRR, No murmurs. Peripheral pulses palpable and equal bilaterally. Respiratory:  Symmetrical chest wall expansion.  No rhonchi, rales, or wheezes.  Good air movement throughout.  No use of accessory muscles.   Musculoskeletal:  No cyanosis or edema. Moving extremities with full ROM.  Mild tenderness to palpation over T-spine and thoracic paraspinal muscles.  No tenderness to the C-spine. Abdomen:  Soft, nontender, nondistended. Neuro:  GCS 15, moving all four extremities, interacting appropriately. Speech clear.  Neurovascular intact throughout bilateral upper and lower extremities Psych:  Calm, appropriate.   Skin:  Warm, dry, no rash.    ED Results / Procedures / Treatments   Labs (all labs ordered are listed, but only abnormal results are displayed) Labs Reviewed  CBC WITH DIFFERENTIAL/PLATELET - Abnormal; Notable for the following components:      Result Value   Eosinophils Absolute 0.9 (*)    All other components within normal limits  BASIC METABOLIC PANEL - Abnormal; Notable for the following components:   Sodium 134 (*)    Glucose, Bld 129 (*)  All other components within normal limits     EKG    RADIOLOGY CT is pending at time of signout   PROCEDURES:  Critical Care performed: No  Procedures   MEDICATIONS ORDERED IN ED: Medications  ketorolac (TORADOL) injection 30 mg (30 mg Intramuscular Given 05/16/23 1346)  oxyCODONE-acetaminophen (PERCOCET/ROXICET) 5-325 MG per tablet 1 tablet (1 tablet Oral Given 05/16/23 1345)  dexamethasone (DECADRON) tablet 10 mg (10 mg Oral Given 05/16/23 1346)     IMPRESSION / MDM / ASSESSMENT AND PLAN / ED COURSE  I reviewed the triage vital signs and the nursing notes.                              Differential diagnosis includes, but is not limited to, disc impingement, nerve impingement, acute on chronic back pain, compression fracture of the thoracic or cervical region  Patient's presentation is most consistent with  exacerbation of chronic illness.  Patient is a 58 year old female presenting today for acute on chronic back pain.  States chronic pain in the symptoms but worsened 2 months ago.  Generalized tenderness palpation throughout the back as well as thoracic and C-spine.  No acute neurological deficits.  Vital signs otherwise stable.  No red flag symptoms today.  Laboratory workup reassuring.  Will get CT imaging of C-spine and thoracic spine to rule out any acute obvious pathology.  If imaging negative, will have patient follow-up with orthopedics team.  Patient given Toradol, Percocet, and Decadron here in the ED.  Signed out to oncoming provider pending results of CT imaging.     FINAL CLINICAL IMPRESSION(S) / ED DIAGNOSES   Final diagnoses:  Chronic bilateral thoracic back pain     Rx / DC Orders   ED Discharge Orders     None        Note:  This document was prepared using Dragon voice recognition software and may include unintentional dictation errors.   Janith Lima, MD 05/16/23 [phone]

## 2023-05-16 NOTE — ED Notes (Signed)
 See triage notes. Patient c/o mid back pain that started around two months ago secondary to lifting something heavy. The patient states that the pain has consistently gotten worse. Patient c/o right arm intermittent tingling.

## 2023-05-16 NOTE — ED Triage Notes (Signed)
 Pt arrives with c/o mid back pain that started about 2 months ago after she lifted something heavy. Per pt, the pain keeps getting worse. Pt also c/o right arm intermittent tingling sensation. Pt denies urinary/stool incontinence.

## 2023-05-16 NOTE — Discharge Instructions (Addendum)
 Please continue to follow-up with your orthopedic team for ongoing outpatient management of your back problems. Or call Neurosurgery for second opinon.   IMPRESSION: 1. Reversal of cervical lordosis with postsurgical changes at C5-C6 and C6-C7. Multilevel hypertrophic facet degenerative changes but no significant foraminal narrowing. No acute osseous abnormality.   CT thoracic IMPRESSION: No acute osseous abnormality. Mild multilevel degenerative osteophytes.

## 2023-05-16 NOTE — ED Provider Notes (Signed)
 5:24 PM Assumed care for off going team.   Blood pressure 138/80, pulse 96, temperature 98.1 F (36.7 C), resp. rate 17, height 5\' 8"  (1.727 m), weight 70.3 kg, SpO2 100%.  See their HPI for full report but in brief pending CT imaging  IMPRESSION: 1. Reversal of cervical lordosis with postsurgical changes at C5-C6 and C6-C7. Multilevel hypertrophic facet degenerative changes but no significant foraminal narrowing. No acute osseous abnormality.   IMPRESSION: No acute osseous abnormality. Mild multilevel degenerative osteophytes.  Equal sensation bilaterally.  Equal strength in arms equal strength in legs.  No saddle anesthesia no urinating or defecating on herself no signs of cord compression  Reports this been a chronic issue for the past 2 months.  Does report intermittent tingling in her arm over the 2 months and she asked told her orthopedic care doctor about it and she had asked him to order a CT myelogram but he stated that she needed to do physical therapy first.  Patient is requesting that I order a CT myelogram.  Discussed with patient that CT myelogram can be only ordered for specific reasons I do not see any evidence of cord compression given its after 509 feeling that even possible here.  We discussed MRIs but she is unable to get MRI due to her stimulator.  This seems a chronic issue can be followed up outpatient with orthopedics.  Discussed this with patient and she is requesting a second opinion.  Will give her the number for neurosurgery as she can follow-up outpatient with them.  Patient feels comfortable with this plan.   Concha Se, MD 05/16/23 [phone]

## 2023-05-17 ENCOUNTER — Other Ambulatory Visit: Payer: Self-pay | Admitting: Orthopedic Surgery

## 2023-05-17 ENCOUNTER — Telehealth: Payer: Self-pay

## 2023-05-17 DIAGNOSIS — G8918 Other acute postprocedural pain: Secondary | ICD-10-CM

## 2023-05-17 NOTE — Telephone Encounter (Signed)
 Phone call to patient to discuss order for intradiscal injection. Reviewed pts allergies and most recent weight (155lbs). Pt was advised not to take oral antibiotics for this procedure that we would give him IV antibiotics prior. Discharge instructions also reviewed with patient and instructed patient to have a driver the day of the procedure. Pt verbalized understanding.

## 2023-05-21 ENCOUNTER — Encounter: Payer: Self-pay | Admitting: Family Medicine

## 2023-05-21 ENCOUNTER — Ambulatory Visit: Admitting: Family Medicine

## 2023-05-21 VITALS — BP 110/66 | HR 103 | Resp 16 | Ht 68.0 in | Wt 156.3 lb

## 2023-05-21 DIAGNOSIS — F331 Major depressive disorder, recurrent, moderate: Secondary | ICD-10-CM | POA: Diagnosis not present

## 2023-05-21 DIAGNOSIS — J432 Centrilobular emphysema: Secondary | ICD-10-CM

## 2023-05-21 DIAGNOSIS — N951 Menopausal and female climacteric states: Secondary | ICD-10-CM

## 2023-05-21 DIAGNOSIS — F5104 Psychophysiologic insomnia: Secondary | ICD-10-CM

## 2023-05-21 DIAGNOSIS — K2270 Barrett's esophagus without dysplasia: Secondary | ICD-10-CM

## 2023-05-21 DIAGNOSIS — G90511 Complex regional pain syndrome I of right upper limb: Secondary | ICD-10-CM

## 2023-05-21 DIAGNOSIS — R000 Tachycardia, unspecified: Secondary | ICD-10-CM

## 2023-05-21 DIAGNOSIS — M8580 Other specified disorders of bone density and structure, unspecified site: Secondary | ICD-10-CM

## 2023-05-21 MED ORDER — TRELEGY ELLIPTA 100-62.5-25 MCG/ACT IN AEPB: 1.0000 | INHALATION_SPRAY | Freq: Every day | RESPIRATORY_TRACT | 1 refills | Status: AC

## 2023-05-21 MED ORDER — ESZOPICLONE 3 MG PO TABS
3.0000 mg | ORAL_TABLET | Freq: Every day | ORAL | 0 refills | Status: DC
Start: 2023-05-21 — End: 2023-08-21

## 2023-05-21 MED ORDER — VENLAFAXINE HCL ER 150 MG PO CP24: 150.0000 mg | ORAL_CAPSULE | Freq: Every day | ORAL | 0 refills | Status: DC

## 2023-05-21 MED ORDER — ESTRADIOL 0.5 MG PO TABS: 0.5000 mg | ORAL_TABLET | Freq: Every day | ORAL | 0 refills | Status: DC

## 2023-05-21 MED ORDER — CELECOXIB 100 MG PO CAPS: 100.0000 mg | ORAL_CAPSULE | Freq: Two times a day (BID) | ORAL | 0 refills | Status: DC

## 2023-05-21 NOTE — Progress Notes (Signed)
 Name: Tara [name]   MRN: [id number]    DOB: 03-26-1965   Date:05/21/2023       Progress Note  Subjective  Chief Complaint  Chief Complaint  Patient presents with   Hospitalization Follow-up    Back pain- still the same   HPI   History of lumbar spine fusion and posterior fixation 07/2021 by Dr. Shon Baton and  had to go back in 11/2022 to remove screws do to increase in pain. She picked up a heavy object in January and developed increase in low back, followed up with Dr. Ethelene Hal end of Feb and had a CT lumbar spine and myelogram. She went to [facility] on 03/06 due to increase in upper back and neck pain and noticed some paresthesia right arm and down to 5 th and 6 th fingers right hand. She has C5-6 and C6-7 surgical changes and also some DDD . She has a follow up with Dr. Shon Baton.   IMPRESSION: Myelogram  1. Severe facet arthrosis at L4-5 with grade 1 anterolisthesis and unchanged mild spinal, lateral recess, and neural foraminal stenosis. 2. L5-S1 ALIF with solid arthrodesis and no stenosis. 3.  Aortic Atherosclerosis (ICD10-I70.0).   Emphysema: she has not been taking Trelegy lately since it can increase her osteoporosis risk, states no cough, wheezing but she has noticed some SOB. Advised to try taking it when having symptoms.   Hyperlipidemia: continue crestor, she is due for labs on her next visit   Hot flashes: she started estrace Fall 2024 and is doing well.   Osteopenia: her Ortho is worried about having to do another back surgery on her since they had to remove screws from her back due to bone loss. She should not take Alendronate due to history of barretts esophagitis, we will send her to Ortho. Taking Estrogen, vitamin has been normal, discussed high calcium diet  MDD recurrent: taking Effexor, never filled Vraylar, feels down now but due to pain, does not want to change medication  Hyperglycemia: we will recheck A1C yearly, she has been avoiding sweets.   Chronic insomnia: needs  refills of Lunesta    Patient Active Problem List   Diagnosis Date Noted   Painful orthopaedic hardware (HCC) 11/29/2022   Hiatal hernia 03/27/2022   Osteopenia after menopause 02/15/2022   Gastroesophageal reflux disease    Centrilobular emphysema (HCC) 11/10/2021   Chronic insomnia 11/10/2021   S/P lumbar fusion 07/27/2021   Thoracic aorta atherosclerosis (HCC) 05/26/2021   Complex regional pain syndrome type 1 of right upper extremity 10/16/2019   Gastric erythema 07/24/2017   Purpura, nonthrombocytopenic (HCC) 05/17/2017   Fatty liver 01/28/2015   Barrett esophagus 09/10/2014   Major depression, recurrent, chronic (HCC) 09/08/2014   Insomnia, persistent 09/08/2014   Arthralgia, sacroiliac 09/08/2014   Degeneration of lumbar or lumbosacral intervertebral disc 09/08/2014   Gastro-esophageal reflux disease without esophagitis 09/08/2014   Perennial allergic rhinitis 09/08/2014   Chronic pain 01/25/2011    Past Surgical History:  Procedure Laterality Date   ABDOMINAL EXPOSURE N/A 07/27/2021   Procedure: ABDOMINAL EXPOSURE FOR ANTERIOR SPINE SURGERY;  Surgeon: Leonie Douglas, MD;  Location: [facility] OR;  Service: Vascular;  Laterality: N/A;   ABDOMINAL HYSTERECTOMY  2005   partial   ANTERIOR AND POSTERIOR SPINAL FUSION N/A 07/27/2021   Procedure: ANTERIOR LUMBAR INTERBODY FUSION AND POSTERIOR SPINAL FUSION WITH PEDICLE SCREWS LUMBAR FIVE TO SACRAL ONE;  Surgeon: Venita Lick, MD;  Location: [facility] OR;  Service: Orthopedics;  Laterality: N/A;  4.5 hrs Dr.  Hawken to do approach left tap block with exparel 3 C-Bed   CERVICAL DISC ARTHROPLASTY  2008   ESOPHAGOGASTRODUODENOSCOPY (EGD) WITH PROPOFOL N/A 06/04/2017   Procedure: ESOPHAGOGASTRODUODENOSCOPY (EGD) WITH PROPOFOL;  Surgeon: Christena Deem, MD;  Location[facility] ENDOSCOPY;  Service: Endoscopy;  Laterality: N/A;   ESOPHAGOGASTRODUODENOSCOPY (EGD) WITH PROPOFOL N/A 12/21/2021   Procedure: ESOPHAGOGASTRODUODENOSCOPY (EGD) WITH  PROPOFOL;  Surgeon: Toney Reil, MD;  Location: [facility] ENDOSCOPY;  Service: Gastroenterology;  Laterality: N/A;   HARDWARE REMOVAL N/A 11/29/2022   Procedure: Removal of symptomatic hardware - pedicle screws;  Surgeon: Venita Lick, MD;  Location: [facility] OR;  Service: Orthopedics;  Laterality: N/A;   INSERTION OF MESH  03/27/2022   Procedure: INSERTION OF MESH;  Surgeon: Leafy Ro, MD;  Location: [facility] ORS;  Service: General;;   KNEE ARTHROSCOPY Right 2008   LUMBAR LAMINECTOMY/DECOMPRESSION MICRODISCECTOMY  01/25/2011   Procedure: LUMBAR LAMINECTOMY/DECOMPRESSION MICRODISCECTOMY;  Surgeon: Alvy Beal;  Location: [facility] OR;  Service: Orthopedics;  Laterality: Left;  LEFT L5-S1 MICRODISCECTOMY, Central Decompression Lumbar five-sacral one   OOPHORECTOMY Left 2011   OOPHORECTOMY Right 10/2012   SPINAL CORD STIMULATOR IMPLANT  2009   Removal-01/2012   SPINAL CORD STIMULATOR REMOVAL  01/17/2012   Procedure: LUMBAR SPINAL CORD STIMULATOR REMOVAL;  Surgeon: Venita Lick, MD;  Location: [facility] OR;  Service: Orthopedics;  Laterality: N/A;  Spinal Cord Battery Removal   TUBAL LIGATION  1988   XI ROBOTIC ASSISTED PARAESOPHAGEAL HERNIA REPAIR N/A 03/27/2022   Procedure: XI ROBOTIC ASSISTED PARAESOPHAGEAL HERNIA REPAIR, RNFA to assist;  Surgeon: Leafy Ro, MD;  Location: [facility] ORS;  Service: General;  Laterality: N/A;    Family History  Problem Relation Age of Onset   Depression Mother    Ovarian cancer Mother    Hypertension Mother    Hypercholesterolemia Mother    Obesity Mother    Osteoarthritis Mother    Hypertension Sister    Obesity Sister    Stroke Sister    Healthy Sister    Healthy Daughter    Tongue cancer Daughter    Healthy Daughter    Breast cancer Neg Hx     Social History   Tobacco Use   Smoking status: Former    Current packs/day: 0.00    Average packs/day: 1 pack/day for 30.0 years (30.0 ttl pk-yrs)    Types: Cigarettes    Start date: 09/10/1986    Quit date: 2018     Years since quitting: 7.1   Smokeless tobacco: Former  Substance Use Topics   Alcohol use: No    Alcohol/week: 0.0 standard drinks of alcohol     Current Outpatient Medications:    CALCIUM PO, Take 1,500 mg by mouth daily., Disp: , Rfl:    cariprazine (VRAYLAR) 1.5 MG capsule, Take 1 capsule (1.5 mg total) by mouth daily., Disp: 90 capsule, Rfl: 0   estradiol (ESTRACE) 0.5 MG tablet, Take 1 tablet (0.5 mg total) by mouth daily., Disp: 90 tablet, Rfl: 0   Eszopiclone 3 MG TABS, Take 1 tablet (3 mg total) by mouth at bedtime. Take immediately before bedtime, Disp: 90 tablet, Rfl: 0   folic acid (FOLVITE) 400 MCG tablet, Take 400 mcg by mouth daily., Disp: , Rfl:    rosuvastatin (CRESTOR) 10 MG tablet, TAKE 1 TABLET(10 MG) BY MOUTH DAILY, Disp: 90 tablet, Rfl: 3   tiZANidine (ZANAFLEX) 4 MG tablet, Take 1 tablet (4 mg total) by mouth every 8 (eight) hours as needed for muscle spasms., Disp: 90  tablet, Rfl: 0   TRELEGY ELLIPTA 100-62.5-25 MCG/ACT AEPB, Inhale 1 puff into the lungs daily., Disp: 180 each, Rfl: 1   venlafaxine XR (EFFEXOR-XR) 150 MG 24 hr capsule, Take 1 capsule (150 mg total) by mouth daily with breakfast. TAKE 1 CAPSULE(150 MG) BY MOUTH DAILY WITH BREAKFAST, Disp: 90 capsule, Rfl: 0   vitamin B-12 (CYANOCOBALAMIN) 100 MCG tablet, Take 100 mcg by mouth 3 (three) times a week., Disp: , Rfl:    VITAMIN D-VITAMIN K PO, Take 1 tablet by mouth daily., Disp: , Rfl:    ZOFRAN 4 MG tablet, Take 4 mg by mouth every 8 (eight) hours as needed for refractory nausea / vomiting., Disp: , Rfl:    cyclobenzaprine (FLEXERIL) 10 MG tablet, Take 1 tablet 3 times a day by oral route. (Patient not taking: Reported on 05/21/2023), Disp: , Rfl:   Allergies  Allergen Reactions   Clonidine Derivatives Itching    I personally reviewed active problem list, medication list, allergies, family history with the patient/caregiver today.   ROS  Ten systems reviewed and is negative except as mentioned  in HPI    Objective  Vitals:   05/21/23 1552 05/21/23 1553  BP:  110/66  Pulse: (!) 111 (!) 103  Resp: 16   SpO2: 97%   Weight: 156 lb 4.8 oz (70.9 kg)   Height: 5\' 8"  (1.727 m)     Body mass index is 23.77 kg/m.  Physical Exam  Constitutional: Patient appears well-developed and well-nourished. Overweight.  in mild distress due to pain  HEENT: head atraumatic, normocephalic, pupils equal and reactive to light, neck supple, throat within normal limits Cardiovascular: Normal rate, regular rhythm and normal heart sounds.  No murmur heard. No BLE edema. Pulmonary/Chest: Effort normal and breath sounds normal. No respiratory distress. Abdominal: Soft.  There is no tenderness. Muscular skeletal: pain during palpation of lumbar and thoracic spine Psychiatric: Patient has a normal mood and affect. behavior is normal. Judgment and thought content normal.   Recent Results (from the past 2160 hours)  CBC with Differential     Status: Abnormal   Collection Time: 05/16/23 11:46 AM  Result Value Ref Range   WBC 9.4 4.0 - 10.5 K/uL   RBC 4.38 3.87 - 5.11 MIL/uL   Hemoglobin 13.9 12.0 - 15.0 g/dL   HCT 16.1 09.6 - 04.5 %   MCV 93.6 80.0 - 100.0 fL   MCH 31.7 26.0 - 34.0 pg   MCHC 33.9 30.0 - 36.0 g/dL   RDW 40.9 81.1 - 91.4 %   Platelets 301 150 - 400 K/uL   nRBC 0.0 0.0 - 0.2 %   Neutrophils Relative % 53 %   Neutro Abs 5.0 1.7 - 7.7 K/uL   Lymphocytes Relative 28 %   Lymphs Abs 2.6 0.7 - 4.0 K/uL   Monocytes Relative 8 %   Monocytes Absolute 0.8 0.1 - 1.0 K/uL   Eosinophils Relative 10 %   Eosinophils Absolute 0.9 (H) 0.0 - 0.5 K/uL   Basophils Relative 1 %   Basophils Absolute 0.1 0.0 - 0.1 K/uL   Immature Granulocytes 0 %   Abs Immature Granulocytes 0.03 0.00 - 0.07 K/uL    Comment: Performed at [facility], [address] [place], [place] [address]  Basic metabolic panel     Status: Abnormal   Collection Time: 05/16/23 11:46 AM  Result Value Ref Range    Sodium 134 (L) 135 - 145 mmol/L   Potassium 4.4 3.5 - 5.1  mmol/L   Chloride 100 98 - 111 mmol/L   CO2 25 22 - 32 mmol/L   Glucose, Bld 129 (H) 70 - 99 mg/dL    Comment: Glucose reference range applies only to samples taken after fasting for at least 8 hours.   BUN 15 6 - 20 mg/dL   Creatinine, Ser 1.61 0.44 - 1.00 mg/dL   Calcium 8.9 8.9 - 09.6 mg/dL   GFR, Estimated >04 >54 mL/min    Comment: (NOTE) Calculated using the CKD-EPI Creatinine Equation (2021)    Anion gap 9 5 - 15    Comment: Performed at [facility], [address] [place], [place] [address]    Diabetic Foot Exam:     PHQ2/9:    05/21/2023    3:51 PM 02/05/2023   11:03 AM 02/05/2023   10:41 AM 11/16/2022    9:02 AM 08/20/2022   11:47 AM  Depression screen PHQ 2/9  Decreased Interest 3 3 0 1 0  Down, Depressed, Hopeless 3 3 0 1 0  PHQ - 2 Score 6 6 0 2 0  Altered sleeping 3 3 0 1 0  Tired, decreased energy 3 3 0 1 0  Change in appetite 0  0 1 0  Feeling bad or failure about yourself  0 0 0 0 0  Trouble concentrating 0 0 0 1 0  Moving slowly or fidgety/restless 0 0 0 1 0  Suicidal thoughts 0 0 0 0 0  PHQ-9 Score 12 12 0 7 0  Difficult doing work/chores Somewhat difficult Very difficult  Somewhat difficult Not difficult at all    phq 9 is positive  Fall Risk:    02/05/2023   10:41 AM 11/16/2022    9:02 AM 08/20/2022   11:46 AM 07/18/2022   12:45 PM 02/15/2022   11:16 AM  Fall Risk   Falls in the past year? 0 0 0 0 0  Number falls in past yr: 0  0 0 0  Injury with Fall? 0  0 0 0  Risk for fall due to : No Fall Risks Orthopedic patient   No Fall Risks  Follow up Falls prevention discussed Falls prevention discussed;Education provided;Falls evaluation completed   Falls prevention discussed     Assessment & Plan  1. Centrilobular emphysema (HCC) (Primary)  Must resume Trelegy to control SOB with activity   2. Depression, major, recurrent, moderate (HCC)  - venlafaxine XR (EFFEXOR-XR) 150  MG 24 hr capsule; Take 1 capsule (150 mg total) by mouth daily with breakfast. TAKE 1 CAPSULE(150 MG) BY MOUTH DAILY WITH BREAKFAST  Dispense: 90 capsule; Refill: 0  3. Barrett's esophagus without dysplasia  Doing well at this time   4. Chronic insomnia  - Eszopiclone 3 MG TABS; Take 1 tablet (3 mg total) by mouth at bedtime. Take immediately before bedtime  Dispense: 90 tablet; Refill: 0  5. Osteopenia after menopause  - Ambulatory referral to Endocrinology - estradiol (ESTRACE) 0.5 MG tablet; Take 1 tablet (0.5 mg total) by mouth daily.  Dispense: 90 tablet; Refill: 0   6. Hot flashes, menopausal  - estradiol (ESTRACE) 0.5 MG tablet; Take 1 tablet (0.5 mg total) by mouth daily.  Dispense: 90 tablet; Refill: 0   8. Tachycardia  BP is a little low, advised to drink fluids, monitor for palpitation, let me know if it persists

## 2023-05-22 NOTE — Discharge Instructions (Signed)
Intra-Discal Anesthetic Injection Discharge Instruction Sheet  You may resume a regular diet and any medications that you routinely take (including pain medications).  No driving day of procedure.  Light activity throughout the rest of the day.  Do not do any strenuous work, exercise, bending or lifting.  The day following the procedure, you can resume normal physical activity but you should refrain from exercising or physical therapy for at least three days thereafter.   Please contact our office at [phone] for the following symptoms: Fever greater than 100 degrees. Headaches unresolved with medication after 2-3 days. 

## 2023-05-23 ENCOUNTER — Ambulatory Visit
Admission: RE | Admit: 2023-05-23 | Discharge: 2023-05-23 | Discharge status: Home / Self Care | Source: Ambulatory Visit | Attending: Orthopedic Surgery | Admitting: Orthopedic Surgery

## 2023-05-23 ENCOUNTER — Inpatient Hospital Stay
Admission: RE | Admit: 2023-05-23 | Discharge: 2023-05-23 | Disposition: A | Discharge status: Home / Self Care | Source: Ambulatory Visit | Attending: Orthopedic Surgery | Admitting: Orthopedic Surgery

## 2023-05-23 DIAGNOSIS — G8918 Other acute postprocedural pain: Secondary | ICD-10-CM

## 2023-05-23 MED ORDER — CEFAZOLIN SODIUM-DEXTROSE 2-4 GM/100ML-% IV SOLN
2.0000 g | INTRAVENOUS | Status: AC
  Administered 2023-05-23: 2 g via INTRAVENOUS

## 2023-05-23 MED ORDER — METHYLPREDNISOLONE ACETATE 40 MG/ML INJ SUSP (RADIOLOG
80.0000 mg | Freq: Once | INTRAMUSCULAR | Status: AC
  Administered 2023-05-23: 80 mg via INTRALESIONAL

## 2023-05-23 NOTE — Procedures (Signed)
  Procedure:  Fluoro guided L4/5 intradiscal injection depomedrol 80ml, 2ml lido 1% Preprocedure diagnosis: The encounter diagnosis was Acute postoperative pain. Postprocedure diagnosis: same EBL:    minimal Complications:   none immediate  See full dictation in [employer].  Thora Lance MD Main # [phone] Pager  [phone] Mobile [phone]

## 2023-05-23 NOTE — Discharge Instructions (Signed)
Intra-Discal Anesthetic Injection Discharge Instruction Sheet  You may resume a regular diet and any medications that you routinely take (including pain medications).  No driving day of procedure.  Light activity throughout the rest of the day.  Do not do any strenuous work, exercise, bending or lifting.  The day following the procedure, you can resume normal physical activity but you should refrain from exercising or physical therapy for at least three days thereafter.   Please contact our office at [phone] for the following symptoms: Fever greater than 100 degrees. Headaches unresolved with medication after 2-3 days. 

## 2023-05-28 ENCOUNTER — Telehealth: Payer: Self-pay | Admitting: Neurosurgery

## 2023-05-28 NOTE — Telephone Encounter (Signed)
 [facility] FOLLOW UP   Called patient 3x  Clinic: okay for APP  Timeline: 2-4 weeks  Tests to order: none

## 2023-07-03 ENCOUNTER — Encounter: Payer: Self-pay | Admitting: Family Medicine

## 2023-07-03 ENCOUNTER — Other Ambulatory Visit: Payer: Self-pay | Admitting: Family Medicine

## 2023-07-03 DIAGNOSIS — M5431 Sciatica, right side: Secondary | ICD-10-CM

## 2023-07-03 MED ORDER — MELOXICAM 15 MG PO TABS: 15.0000 mg | ORAL_TABLET | Freq: Every day | ORAL | 0 refills | Status: DC

## 2023-07-10 ENCOUNTER — Other Ambulatory Visit: Payer: Self-pay | Admitting: Family Medicine

## 2023-07-10 DIAGNOSIS — Z1231 Encounter for screening mammogram for malignant neoplasm of breast: Secondary | ICD-10-CM

## 2023-07-19 ENCOUNTER — Ambulatory Visit
Admission: RE | Admit: 2023-07-19 | Discharge: 2023-07-19 | Disposition: A | Discharge status: Home / Self Care | Source: Ambulatory Visit | Attending: Family Medicine | Admitting: Family Medicine

## 2023-07-19 DIAGNOSIS — Z1231 Encounter for screening mammogram for malignant neoplasm of breast: Secondary | ICD-10-CM | POA: Diagnosis present

## 2023-07-25 ENCOUNTER — Ambulatory Visit: Payer: Self-pay

## 2023-07-25 DIAGNOSIS — Z0000 Encounter for general adult medical examination without abnormal findings: Secondary | ICD-10-CM | POA: Diagnosis not present

## 2023-07-25 DIAGNOSIS — Z87891 Personal history of nicotine dependence: Secondary | ICD-10-CM

## 2023-07-25 NOTE — Patient Instructions (Signed)
 [name] , Thank you for taking time out of your busy schedule to complete your Annual Wellness Visit with me. I enjoyed our conversation and look forward to speaking with you again next year. I, as well as your care team,  appreciate your ongoing commitment to your health goals. Please review the following plan we discussed and let me know if I can assist you in the future.  REFERRAL SENT FOR LUNG CA SCREENING  Follow up Visits: Next Medicare AWV with our clinical staff: 08/06/24 @ 3:10 PM BY PHONE   Have you seen your provider in the last 6 months (3 months if uncontrolled diabetes)? Yes   Clinician Recommendations:  Aim for 30 minutes of exercise or brisk walking, 6-8 glasses of water, and 5 servings of fruits and vegetables each day. TAKE CARE!      This is a list of the screening recommended for you and due dates:  Health Maintenance  Topic Date Due   Pneumococcal Vaccination (3 of 3 - PCV20 or PCV21) 07/25/2020   COVID-19 Vaccine (4 - 2024-25 season) 11/11/2022   Zoster (Shingles) Vaccine (1 of 2) 08/21/2023*   Screening for Lung Cancer  09/03/2023   Flu Shot  10/11/2023   DEXA scan (bone density measurement)  01/04/2024   Colon Cancer Screening  04/21/2024   Mammogram  07/18/2024   Medicare Annual Wellness Visit  07/24/2024   DTaP/Tdap/Td vaccine (3 - Td or Tdap) 08/19/2032   Hepatitis C Screening  Completed   HPV Vaccine  Aged Out   Meningitis B Vaccine  Aged Out   HIV Screening  Discontinued  *Topic was postponed. The date shown is not the original due date.    Advanced directives: (ACP Link)Information on Advanced Care Planning can be found at [place]  Secretary of [facility] Advance Health Care Directives Advance Health Care Directives. [email or web address]  Advance Care Planning is important because it:  [x]  Makes sure you receive the medical care that is consistent with your values, goals, and preferences  [x]  It provides guidance to your family and loved ones and reduces  their decisional burden about whether or not they are making the right decisions based on your wishes.  Follow the link provided in your after visit summary or read over the paperwork we have mailed to you to help you started getting your Advance Directives in place. If you need assistance in completing these, please reach out to us  so that we can help you!
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
## 2023-07-29 ENCOUNTER — Other Ambulatory Visit: Payer: Self-pay | Admitting: Family Medicine

## 2023-07-29 ENCOUNTER — Encounter: Payer: Self-pay | Admitting: Family Medicine

## 2023-07-29 DIAGNOSIS — M5431 Sciatica, right side: Secondary | ICD-10-CM

## 2023-07-31 ENCOUNTER — Other Ambulatory Visit: Payer: Self-pay | Admitting: Family Medicine

## 2023-07-31 DIAGNOSIS — I700 Atherosclerosis of aorta: Secondary | ICD-10-CM

## 2023-08-01 ENCOUNTER — Other Ambulatory Visit: Payer: Self-pay | Admitting: Orthopedic Surgery

## 2023-08-01 DIAGNOSIS — Z4889 Encounter for other specified surgical aftercare: Secondary | ICD-10-CM

## 2023-08-01 DIAGNOSIS — M5412 Radiculopathy, cervical region: Secondary | ICD-10-CM | POA: Insufficient documentation

## 2023-08-09 NOTE — Discharge Instructions (Signed)

## 2023-08-12 ENCOUNTER — Inpatient Hospital Stay
Admission: RE | Admit: 2023-08-12 | Discharge: 2023-08-12 | Disposition: A | Discharge status: Home / Self Care | Source: Ambulatory Visit | Attending: Orthopedic Surgery | Admitting: Orthopedic Surgery

## 2023-08-12 ENCOUNTER — Other Ambulatory Visit

## 2023-08-12 ENCOUNTER — Encounter: Payer: Self-pay | Admitting: Family Medicine

## 2023-08-12 ENCOUNTER — Other Ambulatory Visit: Payer: Self-pay

## 2023-08-12 DIAGNOSIS — M546 Pain in thoracic spine: Secondary | ICD-10-CM | POA: Diagnosis present

## 2023-08-12 DIAGNOSIS — G8929 Other chronic pain: Secondary | ICD-10-CM | POA: Insufficient documentation

## 2023-08-12 NOTE — ED Triage Notes (Signed)
 Pt reports back pain that began a few days ago, pt states 6 mos ago she lifted something that was too heavy and has had intermittent pain since.

## 2023-08-13 ENCOUNTER — Emergency Department
Admission: EM | Admit: 2023-08-13 | Discharge: 2023-08-13 | Disposition: A | Discharge status: Home / Self Care | Attending: Emergency Medicine | Admitting: Emergency Medicine

## 2023-08-13 DIAGNOSIS — G8929 Other chronic pain: Secondary | ICD-10-CM

## 2023-08-13 NOTE — ED Provider Notes (Signed)
 [facility] Provider Note    Event Date/Time   First MD Initiated Contact with Patient 08/13/23 0032     (approximate)   History   Back Pain   HPI Tara [name] is a 58 y.o. female with an extensive history of spinal (neck and back) issues and prior surgeries who sees pain management in [place] and has an ongoing care relationship with an orthopedic surgeon affiliated with [facility] (again in [place]).  She presents tonight for persistent back pain that she says is worse recently.  She initially told me that the back pain started a few hours prior to coming to the emergency department.  However, as we discussed her case in greater detail, including the notes I saw on the computer about her recent conversations with her PCP and her orthopedic surgeon, she acknowledged that the back pain has been going on for about 6 months and that she has had multiple outpatient imaging studies that have as yet been unable to identify specific cause.  She is understandably frustrated because she had a CT myelogram of her lower back and of her neck but has not had one of her thoracic spine.  She said that she was supposed to get a thoracic CT myelogram yesterday but there was an issue with the order now they are waiting for insurance approval.  She said that she did some research with AI and it told her she should come immediately to the emergency department because there could be other causes for her pain even though this is the same pain that she has been dealing with for 6 months.  She cannot have an MRI because of a spinal stimulator that was implanted years ago but she is concerned there might be a problem with her spinal cord, or she also brought up that it could be a muscle injury from picking up something too heavy 6 months ago and she was wondering about an ultrasound for her paraspinal muscles.  She has no new numbness or weakness.  She said she does not need pain  medicine because her pain management doctor handles all of that.  She just wants to know what is causing her pain.     Physical Exam   Triage Vital Signs: ED Triage Vitals  Encounter Vitals Group     BP 08/12/23 2251 108/78     Systolic BP Percentile --      Diastolic BP Percentile --      Pulse Rate 08/12/23 2251 89     Resp 08/12/23 2251 18     Temp 08/12/23 2251 97.9 F (36.6 C)     Temp src --      SpO2 08/12/23 2251 99 %     Weight 08/12/23 2251 70.3 kg (155 lb)     Height 08/12/23 2251 1.727 m (5\' 8" )     Head Circumference --      Peak Flow --      Pain Score 08/12/23 2250 10     Pain Loc --      Pain Education --      Exclude from Growth Chart --     Most recent vital signs: Vitals:   08/12/23 2251  BP: 108/78  Pulse: 89  Resp: 18  Temp: 97.9 F (36.6 C)  SpO2: 99%    General: Awake, upset and frustrated, intermittently tearful, but is not ill-appearing. CV:  Good peripheral perfusion.  Regular rate and rhythm. Resp:  Normal effort. Speaking  easily and comfortably, no accessory muscle usage nor intercostal retractions.   Abd:  No distention.  No tenderness to palpation. Other:  Patient is moving all of her extremities without any difficulty with obvious focal neurological deficits.  Patient is reporting intermittent back spasms during our interview.   ED Results / Procedures / Treatments   Labs (all labs ordered are listed, but only abnormal results are displayed) Labs Reviewed - No data to display    PROCEDURES:  Critical Care performed: No  Procedures    IMPRESSION / MDM / ASSESSMENT AND PLAN / ED COURSE  I reviewed the triage vital signs and the nursing notes.                              Differential diagnosis includes, but is not limited to, chronic back pain, spinal arthritis, less likely epidural abscess, transverse myelitis, osteomyelitis.  Patient's presentation is most consistent with exacerbation of chronic illness.    I  reviewed the patient's medical record which is extensive, including office visits by Dr. Vaughn Georges (orthopedics) about 3 weeks ago, patient messages with her primary care doctor, Dr. Ava Lei, from yesterday discussing the worsening pain, prior visits with multiple other providers, and the [place]  controlled substance database that indicates repeated prescriptions for opioid medications from her pain management doctor, Dr. Rexanne Catalina.  She has also been seen in this emergency department previously including a visit for chronic bilateral thoracic back pain about 3 months ago.  The patient also acknowledged that she did obtain the CT myelograms within the last couple of weeks but says that they were in the wrong place and she still needs the thoracic imaging.  I provided reassurance that this issue appears chronic and explained that while I am sympathetic, I am unable to obtain the kind of imaging that she wants tonight.  Ultrasound would not be appropriate for her paraspinal muscle pain, she cannot obtain an MRI, and the CT myelogram is not something we order through the emergency department and is best coordinated through her orthopedic surgeon.  We had a very calm and reasonable discussion about all of this and she and her husband acknowledged that going through the typical outpatient channels for acute on chronic back pain is the most appropriate way forward.  She acknowledges that she does not need pain medicine and has a contract with her pain management doctor anyway.  I gave her some recommendations for outpatient follow-up and she will proceed with the outpatient plan as previously described by her orthopedic surgeon.    The patient's medical screening exam is reassuring with no indication of an emergent medical condition requiring hospitalization or additional evaluation at this point.  The patient is safe and appropriate for discharge and outpatient follow up.      FINAL CLINICAL IMPRESSION(S) / ED  DIAGNOSES   Final diagnoses:  Chronic bilateral thoracic back pain     Rx / DC Orders   ED Discharge Orders     None        Note:  This document was prepared using Dragon voice recognition software and may include unintentional dictation errors.   Lynnda Sas, MD 08/13/23 [phone]

## 2023-08-13 NOTE — Discharge Instructions (Signed)
 As we discussed, there is no indication you have an acute or emergent cause of the back pain that has been troubling you for so long.  It is important that you follow-up as an outpatient with your orthopedic surgeon and primary care doctor, as well as your pain management doctor, for help with this frustrating ongoing issue.  Continue to take your medications as prescribed and use any of the conservative measures you have tried in the past that might provide you some relief (heating pads versus ice packs, etc).

## 2023-08-15 ENCOUNTER — Other Ambulatory Visit: Payer: Self-pay | Admitting: Family Medicine

## 2023-08-15 DIAGNOSIS — N951 Menopausal and female climacteric states: Secondary | ICD-10-CM

## 2023-08-15 DIAGNOSIS — M8580 Other specified disorders of bone density and structure, unspecified site: Secondary | ICD-10-CM

## 2023-08-21 ENCOUNTER — Ambulatory Visit: Admitting: Family Medicine

## 2023-08-21 ENCOUNTER — Encounter: Payer: Self-pay | Admitting: Family Medicine

## 2023-08-21 VITALS — BP 130/74 | HR 93 | Temp 97.9°F | Ht 68.0 in | Wt 160.5 lb

## 2023-08-21 DIAGNOSIS — Z79899 Other long term (current) drug therapy: Secondary | ICD-10-CM

## 2023-08-21 DIAGNOSIS — G8929 Other chronic pain: Secondary | ICD-10-CM

## 2023-08-21 DIAGNOSIS — M8580 Other specified disorders of bone density and structure, unspecified site: Secondary | ICD-10-CM | POA: Diagnosis not present

## 2023-08-21 DIAGNOSIS — F331 Major depressive disorder, recurrent, moderate: Secondary | ICD-10-CM | POA: Diagnosis not present

## 2023-08-21 DIAGNOSIS — M545 Low back pain, unspecified: Secondary | ICD-10-CM

## 2023-08-21 DIAGNOSIS — F5104 Psychophysiologic insomnia: Secondary | ICD-10-CM

## 2023-08-21 DIAGNOSIS — J432 Centrilobular emphysema: Secondary | ICD-10-CM

## 2023-08-21 DIAGNOSIS — M546 Pain in thoracic spine: Secondary | ICD-10-CM

## 2023-08-21 DIAGNOSIS — I700 Atherosclerosis of aorta: Secondary | ICD-10-CM | POA: Diagnosis not present

## 2023-08-21 DIAGNOSIS — K219 Gastro-esophageal reflux disease without esophagitis: Secondary | ICD-10-CM

## 2023-08-21 DIAGNOSIS — N951 Menopausal and female climacteric states: Secondary | ICD-10-CM

## 2023-08-21 DIAGNOSIS — R739 Hyperglycemia, unspecified: Secondary | ICD-10-CM

## 2023-08-21 DIAGNOSIS — R7989 Other specified abnormal findings of blood chemistry: Secondary | ICD-10-CM

## 2023-08-21 DIAGNOSIS — Z8639 Personal history of other endocrine, nutritional and metabolic disease: Secondary | ICD-10-CM

## 2023-08-21 MED ORDER — ROSUVASTATIN CALCIUM 10 MG PO TABS: 10.0000 mg | ORAL_TABLET | Freq: Every day | ORAL | 0 refills | Status: DC

## 2023-08-21 MED ORDER — ESTRADIOL 0.5 MG PO TABS
0.5000 mg | ORAL_TABLET | Freq: Every day | ORAL | 3 refills | Status: AC
Start: 2023-08-21 — End: ?

## 2023-08-21 MED ORDER — VENLAFAXINE HCL ER 150 MG PO CP24: 150.0000 mg | ORAL_CAPSULE | Freq: Every day | ORAL | 0 refills | Status: DC

## 2023-08-21 MED ORDER — DICLOFENAC SODIUM 75 MG PO TBEC: 75.0000 mg | DELAYED_RELEASE_TABLET | Freq: Two times a day (BID) | ORAL | 0 refills | Status: DC

## 2023-08-21 MED ORDER — ESZOPICLONE 3 MG PO TABS: 3.0000 mg | ORAL_TABLET | Freq: Every day | ORAL | 0 refills | Status: DC

## 2023-08-21 NOTE — Progress Notes (Signed)
 Name: Tara [name]   MRN: [id number]    DOB: [birth year]   Date:08/23/2023       Progress Note  Subjective  Chief Complaint  Chief Complaint  Patient presents with   Medical Management of Chronic Issues   Back Pain    Ongoing   Shortness of Breath    Shortness of breath and dizziness upon exertion. States when she gets up and walks about 10 feet or more she will get short of breath and dizzy    Discussed the use of AI scribe software for clinical note transcription with the patient, who gave verbal consent to proceed.  History of Present Illness Tara [name] is a 58 year old female with emphysema and chronic back pain who presents with shortness of breath and back pain.  She experiences shortness of breath upon exertion, such as walking short distances to the refrigerator or kitchen [place], which has persisted for six months. She feels dizzy and has reduced her physical activity, now walking her dogs for only 12 to 15 minutes twice a day, compared to the couple of miles she used to walk. No chest pain is reported, but she feels tired when walking. She has a history of emphysema and is taking Trelegy for management.  She has chronic bilateral thoracic back pain affecting her entire back, including the lower, middle, and upper regions. She has a known issue with the L4 facet joint and is scheduled for imaging studies, including a CT of the thoracic and lumbar spine and a lumbar myelography. She questions if her lower back pain is related to the pain in her middle and upper back. She is taking oxycodone  10 mg/325 mg for pain and has previously taken meloxicam , but prefers Celebrex  despite experiencing mouth ulcers after four days of use.  She has a history of insomnia and is currently taking Lunesta , although she wants to return to Ambien .    She takes rosuvastatin  for cholesterol and thoracic aortic atherosclerosis.  She is also taking estradiol  for osteoporosis and to manage hot  flashes, which she started experiencing after her ovaries were removed before age 62.  She has a history of major depression, which she attributes to chronic pain, particularly from RSD in her right arm. She is taking Effexor , initially prescribed for hot flashes, and reports that her depression is currently managed.     Patient Active Problem List   Diagnosis Date Noted   Cervical radiculopathy 08/01/2023   Postoperative pain 05/16/2023   Painful orthopaedic hardware (HCC) 11/29/2022   Hiatal hernia 03/27/2022   Osteopenia after menopause 02/15/2022   Gastroesophageal reflux disease    Centrilobular emphysema (HCC) 11/10/2021   Chronic insomnia 11/10/2021   S/P lumbar fusion 07/27/2021   Thoracic aorta atherosclerosis (HCC) 05/26/2021   Complex regional pain syndrome type 1 of right upper extremity 10/16/2019   Gastric erythema 07/24/2017   Purpura, nonthrombocytopenic (HCC) 05/17/2017   Fatty liver 01/28/2015   Barrett esophagus 09/10/2014   Major depression, recurrent, chronic (HCC) 09/08/2014   Insomnia, persistent 09/08/2014   Arthralgia, sacroiliac 09/08/2014   Degeneration of lumbar or lumbosacral intervertebral disc 09/08/2014   Gastro-esophageal reflux disease without esophagitis 09/08/2014   Perennial allergic rhinitis 09/08/2014   Chronic pain 01/25/2011    Past Surgical History:  Procedure Laterality Date   ABDOMINAL EXPOSURE N/A 07/27/2021   Procedure: ABDOMINAL EXPOSURE FOR ANTERIOR SPINE SURGERY;  Surgeon: Carlene Che, MD;  Location: [facility] OR;  Service: Vascular;  Laterality: N/A;  ABDOMINAL HYSTERECTOMY  2005   partial   ANTERIOR AND POSTERIOR SPINAL FUSION N/A 07/27/2021   Procedure: ANTERIOR LUMBAR INTERBODY FUSION AND POSTERIOR SPINAL FUSION WITH PEDICLE SCREWS LUMBAR FIVE TO SACRAL ONE;  Surgeon: Mort Ards, MD;  Location: [facility] OR;  Service: Orthopedics;  Laterality: N/A;  4.5 hrs Dr. Edgardo Goodwill to do approach left tap block with exparel  3 C-Bed   CERVICAL  DISC ARTHROPLASTY  2008   ESOPHAGOGASTRODUODENOSCOPY (EGD) WITH PROPOFOL  N/A 06/04/2017   Procedure: ESOPHAGOGASTRODUODENOSCOPY (EGD) WITH PROPOFOL ;  Surgeon: Deveron Fly, MD;  Location[facility] ENDOSCOPY;  Service: Endoscopy;  Laterality: N/A;   ESOPHAGOGASTRODUODENOSCOPY (EGD) WITH PROPOFOL  N/A 12/21/2021   Procedure: ESOPHAGOGASTRODUODENOSCOPY (EGD) WITH PROPOFOL ;  Surgeon: Selena Daily, MD;  Location: [facility] ENDOSCOPY;  Service: Gastroenterology;  Laterality: N/A;   HARDWARE REMOVAL N/A 11/29/2022   Procedure: Removal of symptomatic hardware - pedicle screws;  Surgeon: Mort Ards, MD;  Location: [facility] OR;  Service: Orthopedics;  Laterality: N/A;   INSERTION OF MESH  03/27/2022   Procedure: INSERTION OF MESH;  Surgeon: Alben Alma, MD;  Location: [facility] ORS;  Service: General;;   KNEE ARTHROSCOPY Right 2008   LUMBAR LAMINECTOMY/DECOMPRESSION MICRODISCECTOMY  01/25/2011   Procedure: LUMBAR LAMINECTOMY/DECOMPRESSION MICRODISCECTOMY;  Surgeon: Marca Senna;  Location: [facility] OR;  Service: Orthopedics;  Laterality: Left;  LEFT L5-S1 MICRODISCECTOMY, Central Decompression Lumbar five-sacral one   OOPHORECTOMY Left 2011   OOPHORECTOMY Right 10/2012   SPINAL CORD STIMULATOR IMPLANT  2009   Removal-01/2012   SPINAL CORD STIMULATOR REMOVAL  01/17/2012   Procedure: LUMBAR SPINAL CORD STIMULATOR REMOVAL;  Surgeon: Mort Ards, MD;  Location: [facility] OR;  Service: Orthopedics;  Laterality: N/A;  Spinal Cord Battery Removal   TUBAL LIGATION  1988   XI ROBOTIC ASSISTED PARAESOPHAGEAL HERNIA REPAIR N/A 03/27/2022   Procedure: XI ROBOTIC ASSISTED PARAESOPHAGEAL HERNIA REPAIR, RNFA to assist;  Surgeon: Alben Alma, MD;  Location: [facility] ORS;  Service: General;  Laterality: N/A;    Family History  Problem Relation Age of Onset   Depression Mother    Ovarian cancer Mother    Hypertension Mother    Hypercholesterolemia Mother    Obesity Mother    Osteoarthritis Mother    Hypertension Sister     Obesity Sister    Stroke Sister    Healthy Sister    Healthy Daughter    Tongue cancer Daughter    Healthy Daughter    Breast cancer Neg Hx     Social History   Tobacco Use   Smoking status: Former    Current packs/day: 0.00    Average packs/day: 1 pack/day for 30.0 years (30.0 ttl pk-yrs)    Types: Cigarettes    Start date: 09/10/1986    Quit date: 2018    Years since quitting: 7.4   Smokeless tobacco: Former  Substance Use Topics   Alcohol use: No    Alcohol/week: 0.0 standard drinks of alcohol     Current Outpatient Medications:    CALCIUM  PO, Take 1,500 mg by mouth daily., Disp: , Rfl:    cyclobenzaprine (FLEXERIL) 10 MG tablet, , Disp: , Rfl:    diclofenac  (VOLTAREN ) 75 MG EC tablet, Take 1 tablet (75 mg total) by mouth 2 (two) times daily., Disp: 180 tablet, Rfl: 0   glucosamine-chondroitin 500-400 MG tablet, Take 1 tablet by mouth daily., Disp: , Rfl:    omeprazole  (PRILOSEC) 40 MG capsule, Take 40 mg by mouth daily., Disp: , Rfl:    oxyCODONE -acetaminophen  (PERCOCET) 10-325 MG  tablet, Take 1 tablet by mouth every 6 (six) hours as needed for pain., Disp: , Rfl:    TRELEGY ELLIPTA  100-62.5-25 MCG/ACT AEPB, Inhale 1 puff into the lungs daily., Disp: 180 each, Rfl: 1   VITAMIN D -VITAMIN K PO, Take 1 tablet by mouth daily., Disp: , Rfl:    estradiol  (ESTRACE ) 0.5 MG tablet, Take 1 tablet (0.5 mg total) by mouth daily., Disp: 90 tablet, Rfl: 3   Eszopiclone  3 MG TABS, Take 1 tablet (3 mg total) by mouth at bedtime. Take immediately before bedtime, Disp: 90 tablet, Rfl: 0   folic acid  (FOLVITE ) 400 MCG tablet, Take 400 mcg by mouth daily. (Patient not taking: Reported on 07/25/2023), Disp: , Rfl:    rosuvastatin  (CRESTOR ) 10 MG tablet, Take 1 tablet (10 mg total) by mouth daily., Disp: 90 tablet, Rfl: 0   venlafaxine  XR (EFFEXOR -XR) 150 MG 24 hr capsule, Take 1 capsule (150 mg total) by mouth daily with breakfast. TAKE 1 CAPSULE(150 MG) BY MOUTH DAILY WITH BREAKFAST, Disp: 90  capsule, Rfl: 0   vitamin B-12 (CYANOCOBALAMIN ) 100 MCG tablet, Take 100 mcg by mouth 3 (three) times a week. (Patient not taking: Reported on 07/25/2023), Disp: , Rfl:   Allergies  Allergen Reactions   Clonidine Derivatives Itching   Celebrex  [Celecoxib ] Rash    Mouth sores    I personally reviewed active problem list, medication list, allergies, family history with the patient/caregiver today.   ROS  Ten systems reviewed and is negative except as mentioned in HPI    Objective Physical Exam CONSTITUTIONAL: Patient appears well-developed and well-nourished.  No distress. HEENT: Head atraumatic, normocephalic, neck supple. CARDIOVASCULAR: Normal rate, regular rhythm and normal heart sounds.  No murmur heard. No BLE edema. PULMONARY: Effort normal and breath sounds normal. No respiratory distress. ABDOMINAL: There is no tenderness or distention. MUSCULOSKELETAL: Pacing around the room, uncomfortable PSYCHIATRIC: Patient has a normal mood and affect. behavior is normal. Judgment and thought content normal.  Vitals:   08/21/23 1459  BP: 130/74  Pulse: 93  Temp: 97.9 F (36.6 C)  TempSrc: Oral  SpO2: 98%  Weight: 160 lb 8 oz (72.8 kg)  Height: 5' 8 (1.727 m)    Body mass index is 24.4 kg/m.    PHQ2/9:    08/21/2023    3:07 PM 07/25/2023   11:33 AM 05/21/2023    3:51 PM 02/05/2023   11:03 AM 02/05/2023   10:41 AM  Depression screen PHQ 2/9  Decreased Interest 0 1 3 3  0  Down, Depressed, Hopeless 0 1 3 3  0  PHQ - 2 Score 0 2 6 6  0  Altered sleeping 0 0 3 3 0  Tired, decreased energy 0 1 3 3  0  Change in appetite 0 0 0  0  Feeling bad or failure about yourself  0 0 0 0 0  Trouble concentrating 0 0 0 0 0  Moving slowly or fidgety/restless 0 0 0 0 0  Suicidal thoughts 0 0 0 0 0  PHQ-9 Score 0 3 12 12  0  Difficult doing work/chores Not difficult at all Not difficult at all Somewhat difficult Very difficult     phq 9 is negative  Fall Risk:    08/21/2023    3:07  PM 07/25/2023   11:35 AM 02/05/2023   10:41 AM 11/16/2022    9:02 AM 08/20/2022   11:46 AM  Fall Risk   Falls in the past year? 0 0 0 0 0  Number falls in  past yr: 0 0 0  0  Injury with Fall? 0 0 0  0  Risk for fall due to : No Fall Risks No Fall Risks No Fall Risks Orthopedic patient   Follow up Falls evaluation completed Falls prevention discussed;Falls evaluation completed Falls prevention discussed Falls prevention discussed;Education provided;Falls evaluation completed      Assessment & Plan Chronic thoracic and low back pain Chronic bilateral thoracic and low back pain, likely exacerbated by compensatory mechanisms. Pain not radiating into legs. Scheduled for CT thoracic spine, lumbar spine, and lumbar myelography. Current medications include oxycodone  and meloxicam . Better pain control with Celebrex  but caused mouth ulcers. Diclofenac  considered as alternative with informed consent obtained for risks. - Prescribe diclofenac  75 mg twice a day with food, as needed. - Advise omeprazole  40 mg twice a day to prevent gastrointestinal irritation. - Continue current pain management regimen with oxycodone . - She already has scheduled  CT thoracic spine, lumbar spine, and lumbar myelography.  Emphysema Centrilobular emphysema with shortness of breath likely due to deconditioning. No increase in cough or wheezing. Managed with Trelegy.  Thoracic aortic atherosclerosis Thoracic aortic atherosclerosis managed with statin therapy. No side effects reported. - Continue rosuvastatin  for cholesterol management and atherosclerosis.  Osteopenia Osteopenia managed with estradiol , alleviating hot flashes. No uterus, allowing continued use. Reassess need for estradiol  around age 9-63. - Prescribe estradiol  0.5 mg with refills for one year.  Iron deficiency anemia Iron deficiency anemia with previous normal B12 and folate levels. Due for routine blood work. - Order CBC, iron studies, B12, folate, and  vitamin D  levels.  Major depressive disorder, recurrent, moderate Major depressive disorder, recurrent, moderate, likely exacerbated by chronic pain. On Effexor , initially for hot flashes, reports ongoing pain-related depression. - Continue Effexor  for depression management.

## 2023-08-22 LAB — COMPREHENSIVE METABOLIC PANEL WITH GFR
AG Ratio: 1.4 (calc) (ref 1.0–2.5)
ALT: 23 U/L (ref 6–29)
AST: 19 U/L (ref 10–35)
Albumin: 4.4 g/dL (ref 3.6–5.1)
Alkaline phosphatase (APISO): 72 U/L (ref 37–153)
BUN: 20 mg/dL (ref 7–25)
CO2: 26 mmol/L (ref 20–32)
Calcium: 9.2 mg/dL (ref 8.6–10.4)
Chloride: 95 mmol/L — ABNORMAL LOW (ref 98–110)
Creat: 0.98 mg/dL (ref 0.50–1.03)
Globulin: 3.1 g/dL (ref 1.9–3.7)
Glucose, Bld: 115 mg/dL — ABNORMAL HIGH (ref 65–99)
Potassium: 5.1 mmol/L (ref 3.5–5.3)
Sodium: 132 mmol/L — ABNORMAL LOW (ref 135–146)
Total Bilirubin: 0.3 mg/dL (ref 0.2–1.2)
Total Protein: 7.5 g/dL (ref 6.1–8.1)
eGFR: 66 mL/min/{1.73_m2} (ref >=60)

## 2023-08-22 LAB — LIPID PANEL
Cholesterol: 124 mg/dL (ref <200)
HDL: 56 mg/dL (ref >=50)
LDL Cholesterol (Calc): 49 mg/dL
Non-HDL Cholesterol (Calc): 68 mg/dL (ref <130)
Total CHOL/HDL Ratio: 2.2 (calc) (ref <5.0)
Triglycerides: 102 mg/dL (ref <150)

## 2023-08-22 LAB — VITAMIN D 25 HYDROXY (VIT D DEFICIENCY, FRACTURES): Vit D, 25-Hydroxy: 65 ng/mL (ref 30–100)

## 2023-08-22 LAB — CBC WITH DIFFERENTIAL/PLATELET
Absolute Lymphocytes: 2523 {cells}/uL (ref 850–3900)
Absolute Monocytes: 789 {cells}/uL (ref 200–950)
Basophils Absolute: 108 {cells}/uL (ref 0–200)
Basophils Relative: 1.3 %
Eosinophils Absolute: 780 {cells}/uL — ABNORMAL HIGH (ref 15–500)
Eosinophils Relative: 9.4 %
HCT: 40.2 % (ref 35.0–45.0)
Hemoglobin: 13.3 g/dL (ref 11.7–15.5)
MCH: 31.5 pg (ref 27.0–33.0)
MCHC: 33.1 g/dL (ref 32.0–36.0)
MCV: 95.3 fL (ref 80.0–100.0)
MPV: 9.5 fL (ref 7.5–12.5)
Monocytes Relative: 9.5 %
Neutro Abs: 4100 {cells}/uL (ref 1500–7800)
Neutrophils Relative %: 49.4 %
Platelets: 372 10*3/uL (ref 140–400)
RBC: 4.22 10*6/uL (ref 3.80–5.10)
RDW: 11.6 % (ref 11.0–15.0)
Total Lymphocyte: 30.4 %
WBC: 8.3 10*3/uL (ref 3.8–10.8)

## 2023-08-22 LAB — HEMOGLOBIN A1C
Hgb A1c MFr Bld: 5.5 % (ref <5.7)
Mean Plasma Glucose: 111 mg/dL
eAG (mmol/L): 6.2 mmol/L

## 2023-08-22 LAB — B12 AND FOLATE PANEL
Folate: 9.9 ng/mL
Vitamin B-12: 589 pg/mL (ref 200–1100)

## 2023-08-23 ENCOUNTER — Ambulatory Visit: Payer: Self-pay | Admitting: Family Medicine

## 2023-08-27 NOTE — Discharge Instructions (Signed)

## 2023-08-28 ENCOUNTER — Ambulatory Visit
Admission: RE | Admit: 2023-08-28 | Discharge: 2023-08-28 | Disposition: A | Discharge status: Home / Self Care | Source: Ambulatory Visit | Attending: Orthopedic Surgery | Admitting: Orthopedic Surgery

## 2023-08-28 DIAGNOSIS — Z4889 Encounter for other specified surgical aftercare: Secondary | ICD-10-CM

## 2023-08-28 MED ORDER — IOPAMIDOL (ISOVUE-M 300) INJECTION 61%
10.0000 mL | Freq: Once | INTRAMUSCULAR | Status: AC | PRN
  Administered 2023-08-28: 10 mL via INTRATHECAL

## 2023-08-28 MED ORDER — ONDANSETRON HCL 4 MG/2ML IJ SOLN: 4.0000 mg | Freq: Once | INTRAMUSCULAR | Status: DC | PRN

## 2023-08-28 MED ORDER — MEPERIDINE HCL 50 MG/ML IJ SOLN: 50.0000 mg | Freq: Once | INTRAMUSCULAR | Status: DC | PRN

## 2023-08-28 MED ORDER — DIAZEPAM 5 MG PO TABS: 10.0000 mg | ORAL_TABLET | Freq: Once | ORAL | Status: DC

## 2023-09-04 ENCOUNTER — Other Ambulatory Visit: Payer: Self-pay

## 2023-09-04 ENCOUNTER — Ambulatory Visit: Admission: RE | Admit: 2023-09-04 | Source: Ambulatory Visit

## 2023-09-04 ENCOUNTER — Encounter: Payer: Self-pay | Admitting: Family Medicine

## 2023-09-04 DIAGNOSIS — Z122 Encounter for screening for malignant neoplasm of respiratory organs: Secondary | ICD-10-CM

## 2023-09-04 DIAGNOSIS — Z87891 Personal history of nicotine dependence: Secondary | ICD-10-CM

## 2023-09-05 ENCOUNTER — Other Ambulatory Visit: Payer: Self-pay | Admitting: Family Medicine

## 2023-09-05 MED ORDER — MELOXICAM 7.5 MG PO TABS: 7.5000 mg | ORAL_TABLET | Freq: Two times a day (BID) | ORAL | 0 refills | Status: DC

## 2023-09-11 ENCOUNTER — Encounter: Payer: Self-pay | Admitting: Family Medicine

## 2023-09-16 ENCOUNTER — Other Ambulatory Visit: Payer: Self-pay | Admitting: Family Medicine

## 2023-09-16 DIAGNOSIS — I700 Atherosclerosis of aorta: Secondary | ICD-10-CM

## 2023-09-17 ENCOUNTER — Ambulatory Visit
Admission: RE | Admit: 2023-09-17 | Discharge: 2023-09-17 | Disposition: A | Discharge status: Home / Self Care | Source: Ambulatory Visit | Attending: Acute Care | Admitting: Acute Care

## 2023-09-17 DIAGNOSIS — Z122 Encounter for screening for malignant neoplasm of respiratory organs: Secondary | ICD-10-CM | POA: Insufficient documentation

## 2023-09-17 DIAGNOSIS — Z87891 Personal history of nicotine dependence: Secondary | ICD-10-CM | POA: Diagnosis present

## 2023-09-27 ENCOUNTER — Other Ambulatory Visit: Payer: Self-pay | Admitting: Family Medicine

## 2023-09-27 ENCOUNTER — Encounter: Payer: Self-pay | Admitting: Family Medicine

## 2023-09-30 ENCOUNTER — Other Ambulatory Visit: Payer: Self-pay | Admitting: Family Medicine

## 2023-09-30 MED ORDER — CELECOXIB 100 MG PO CAPS: 100.0000 mg | ORAL_CAPSULE | Freq: Two times a day (BID) | ORAL | 0 refills | Status: DC

## 2023-10-11 DIAGNOSIS — M47816 Spondylosis without myelopathy or radiculopathy, lumbar region: Secondary | ICD-10-CM | POA: Diagnosis not present

## 2023-10-11 HISTORY — PX: CT GUIDED RFA (ARMC HX): HXRAD979

## 2023-10-24 ENCOUNTER — Other Ambulatory Visit: Payer: Self-pay | Admitting: Acute Care

## 2023-10-24 DIAGNOSIS — Z87891 Personal history of nicotine dependence: Secondary | ICD-10-CM

## 2023-10-24 DIAGNOSIS — Z122 Encounter for screening for malignant neoplasm of respiratory organs: Secondary | ICD-10-CM

## 2023-11-15 ENCOUNTER — Other Ambulatory Visit: Payer: Self-pay | Admitting: Family Medicine

## 2023-11-15 DIAGNOSIS — M545 Low back pain, unspecified: Secondary | ICD-10-CM

## 2023-11-15 DIAGNOSIS — G8929 Other chronic pain: Secondary | ICD-10-CM

## 2023-11-17 ENCOUNTER — Other Ambulatory Visit: Payer: Self-pay | Admitting: Family Medicine

## 2023-11-17 DIAGNOSIS — F331 Major depressive disorder, recurrent, moderate: Secondary | ICD-10-CM

## 2023-11-18 NOTE — Telephone Encounter (Signed)
 Has an appt on 11/26/23, running out on 11/21/23

## 2023-11-26 ENCOUNTER — Encounter: Payer: Self-pay | Admitting: Family Medicine

## 2023-11-26 ENCOUNTER — Ambulatory Visit: Admitting: Family Medicine

## 2023-11-26 VITALS — BP 112/72 | HR 94 | Resp 16 | Ht 68.0 in | Wt 158.4 lb

## 2023-11-26 DIAGNOSIS — F330 Major depressive disorder, recurrent, mild: Secondary | ICD-10-CM | POA: Diagnosis not present

## 2023-11-26 DIAGNOSIS — J432 Centrilobular emphysema: Secondary | ICD-10-CM | POA: Diagnosis not present

## 2023-11-26 DIAGNOSIS — G8929 Other chronic pain: Secondary | ICD-10-CM | POA: Diagnosis not present

## 2023-11-26 DIAGNOSIS — M8580 Other specified disorders of bone density and structure, unspecified site: Secondary | ICD-10-CM

## 2023-11-26 DIAGNOSIS — F5104 Psychophysiologic insomnia: Secondary | ICD-10-CM

## 2023-11-26 DIAGNOSIS — M545 Low back pain, unspecified: Secondary | ICD-10-CM

## 2023-11-26 DIAGNOSIS — N951 Menopausal and female climacteric states: Secondary | ICD-10-CM

## 2023-11-26 DIAGNOSIS — R7989 Other specified abnormal findings of blood chemistry: Secondary | ICD-10-CM

## 2023-11-26 DIAGNOSIS — Z23 Encounter for immunization: Secondary | ICD-10-CM

## 2023-11-26 DIAGNOSIS — I700 Atherosclerosis of aorta: Secondary | ICD-10-CM

## 2023-11-26 MED ORDER — VENLAFAXINE HCL ER 150 MG PO CP24: 150.0000 mg | ORAL_CAPSULE | Freq: Every day | ORAL | 1 refills | Status: DC

## 2023-11-26 MED ORDER — ROSUVASTATIN CALCIUM 10 MG PO TABS: 10.0000 mg | ORAL_TABLET | Freq: Every day | ORAL | 1 refills | Status: DC

## 2023-11-26 MED ORDER — ESZOPICLONE 3 MG PO TABS: 3.0000 mg | ORAL_TABLET | Freq: Every day | ORAL | 0 refills | Status: DC

## 2023-11-26 NOTE — Progress Notes (Signed)
 Name: Tara [name]   MRN: [id number]    DOB: [birth year]   Date:11/26/2023       Progress Note  Subjective  Chief Complaint  Chief Complaint  Patient presents with   Medical Management of Chronic Issues   Discussed the use of AI scribe software for clinical note transcription with the patient, who gave verbal consent to proceed.  History of Present Illness Tara [name] is a 58 year old female with chronic back pain who presents for follow-up after radiofrequency ablation.  She has chronic back pain primarily in the lumbar region, which was previously intense and constant. After undergoing a radiofrequency ablation (RFA) procedure on August 1st, she noticed significant improvement over seven to eight weeks. The pain is now less constant but exacerbates with activities such as bending, cooking, sweeping, or vacuuming. She rates the pain as significant during these activities but notes it is not as constant as before. She continues to manage the pain with oxycodone  as needed, particularly when engaging in activities that aggravate her symptoms.  She has a history of thoracic aortic atherosclerosis and is currently taking rosuvastatin  without any side effects. At one point, her LDL cholesterol was as high as 169, and it was down to 49 at her most recent blood work. She also had a history of prediabetes, but her A1c has returned to normal levels. She is actively avoiding carbohydrates to maintain her blood sugar levels.  She has a history of centrilobular emphysema but no longer smokes. No regular coughing, wheezing, or shortness of breath. She uses Trelegy as needed rather than daily.   She has a history of vitamin deficiencies but currently maintains normal levels of B12, folic acid , and vitamin D  with supplements. She takes folic acid  once or twice a week and B12 three times a week.  She is on venlafaxine  for depression and anxiety, which has improved, although she finds political  discussions stressful.  She also takes eszopiclone  for sleep and estradiol  for managing hot flashes, both of which she finds effective.    Patient Active Problem List   Diagnosis Date Noted   Low vitamin B12 level 11/26/2023   Depression, major, recurrent, mild (HCC) 11/26/2023   Hot flashes, menopausal 11/26/2023   Chronic bilateral low back pain without sciatica 11/26/2023   Cervical radiculopathy 08/01/2023   Postoperative pain 05/16/2023   Painful orthopaedic hardware (HCC) 11/29/2022   Hiatal hernia 03/27/2022   Osteopenia after menopause 02/15/2022   Gastroesophageal reflux disease    Centrilobular emphysema (HCC) 11/10/2021   Chronic insomnia 11/10/2021   S/P lumbar fusion 07/27/2021   Thoracic aorta atherosclerosis (HCC) 05/26/2021   Complex regional pain syndrome type 1 of right upper extremity 10/16/2019   Gastric erythema 07/24/2017   Purpura, nonthrombocytopenic (HCC) 05/17/2017   Fatty liver 01/28/2015   Barrett esophagus 09/10/2014   Major depression, recurrent, chronic (HCC) 09/08/2014   Insomnia, persistent 09/08/2014   Arthralgia, sacroiliac 09/08/2014   Degeneration of lumbar or lumbosacral intervertebral disc 09/08/2014   Gastro-esophageal reflux disease without esophagitis 09/08/2014   Perennial allergic rhinitis 09/08/2014   Chronic pain 01/25/2011    Past Surgical History:  Procedure Laterality Date   ABDOMINAL EXPOSURE N/A 07/27/2021   Procedure: ABDOMINAL EXPOSURE FOR ANTERIOR SPINE SURGERY;  Surgeon: Magda Debby SAILOR, MD;  Location: [facility];  Service: Vascular;  Laterality: N/A;   ABDOMINAL HYSTERECTOMY  2005   partial   ANTERIOR AND POSTERIOR SPINAL FUSION N/A 07/27/2021   Procedure: ANTERIOR LUMBAR INTERBODY FUSION AND  POSTERIOR SPINAL FUSION WITH PEDICLE SCREWS LUMBAR FIVE TO SACRAL ONE;  Surgeon: Burnetta Aures, MD;  Location: [facility] OR;  Service: Orthopedics;  Laterality: N/A;  4.5 hrs Dr. Magda to do approach left tap block with exparel  3 C-Bed    CERVICAL DISC ARTHROPLASTY  2008   CT GUIDED RFA ([facility] HX)  10/11/2023   Dr. Bonner   ESOPHAGOGASTRODUODENOSCOPY (EGD) WITH PROPOFOL  N/A 06/04/2017   Procedure: ESOPHAGOGASTRODUODENOSCOPY (EGD) WITH PROPOFOL ;  Surgeon: Gaylyn Gladis PENNER, MD;  Location: [facility] ENDOSCOPY;  Service: Endoscopy;  Laterality: N/A;   ESOPHAGOGASTRODUODENOSCOPY (EGD) WITH PROPOFOL  N/A 12/21/2021   Procedure: ESOPHAGOGASTRODUODENOSCOPY (EGD) WITH PROPOFOL ;  Surgeon: Unk Corinn Skiff, MD;  Location: [facility] ENDOSCOPY;  Service: Gastroenterology;  Laterality: N/A;   HARDWARE REMOVAL N/A 11/29/2022   Procedure: Removal of symptomatic hardware - pedicle screws;  Surgeon: Burnetta Aures, MD;  Location: [facility] OR;  Service: Orthopedics;  Laterality: N/A;   INSERTION OF MESH  03/27/2022   Procedure: INSERTION OF MESH;  Surgeon: Jordis Laneta FALCON, MD;  Location: [facility] ORS;  Service: General;;   KNEE ARTHROSCOPY Right 2008   LUMBAR LAMINECTOMY/DECOMPRESSION MICRODISCECTOMY  01/25/2011   Procedure: LUMBAR LAMINECTOMY/DECOMPRESSION MICRODISCECTOMY;  Surgeon: Aures JONETTA Burnetta;  Location: [facility] OR;  Service: Orthopedics;  Laterality: Left;  LEFT L5-S1 MICRODISCECTOMY, Central Decompression Lumbar five-sacral one   OOPHORECTOMY Left 2011   OOPHORECTOMY Right 10/2012   SPINAL CORD STIMULATOR IMPLANT  2009   Removal-01/2012   SPINAL CORD STIMULATOR REMOVAL  01/17/2012   Procedure: LUMBAR SPINAL CORD STIMULATOR REMOVAL;  Surgeon: Aures Burnetta, MD;  Location: [facility] OR;  Service: Orthopedics;  Laterality: N/A;  Spinal Cord Battery Removal   TUBAL LIGATION  1988   XI ROBOTIC ASSISTED PARAESOPHAGEAL HERNIA REPAIR N/A 03/27/2022   Procedure: XI ROBOTIC ASSISTED PARAESOPHAGEAL HERNIA REPAIR, RNFA to assist;  Surgeon: Jordis Laneta FALCON, MD;  Location: [facility] ORS;  Service: General;  Laterality: N/A;    Family History  Problem Relation Age of Onset   Depression Mother    Ovarian cancer Mother    Hypertension Mother    Hypercholesterolemia Mother     Obesity Mother    Osteoarthritis Mother    Hypertension Sister    Obesity Sister    Stroke Sister    Healthy Sister    Healthy Daughter    Tongue cancer Daughter    Healthy Daughter    Breast cancer Neg Hx     Social History   Tobacco Use   Smoking status: Former    Current packs/day: 0.00    Average packs/day: 1 pack/day for 30.0 years (30.0 ttl pk-yrs)    Types: Cigarettes    Start date: 09/10/1986    Quit date: 2018    Years since quitting: 7.7   Smokeless tobacco: Former  Substance Use Topics   Alcohol use: No    Alcohol/week: 0.0 standard drinks of alcohol     Current Outpatient Medications:    CALCIUM  PO, Take 1,500 mg by mouth daily., Disp: , Rfl:    celecoxib  (CELEBREX ) 100 MG capsule, Take 1 capsule (100 mg total) by mouth 2 (two) times daily., Disp: 60 capsule, Rfl: 0   cyclobenzaprine (FLEXERIL) 10 MG tablet, , Disp: , Rfl:    estradiol  (ESTRACE ) 0.5 MG tablet, Take 1 tablet (0.5 mg total) by mouth daily., Disp: 90 tablet, Rfl: 3   glucosamine-chondroitin 500-400 MG tablet, Take 1 tablet by mouth daily., Disp: , Rfl:    oxyCODONE -acetaminophen  (PERCOCET) 10-325 MG tablet, Take 1 tablet by mouth every 6 (  six) hours as needed for pain., Disp: , Rfl:    TRELEGY ELLIPTA  100-62.5-25 MCG/ACT AEPB, Inhale 1 puff into the lungs daily., Disp: 180 each, Rfl: 1   VITAMIN D -VITAMIN K PO, Take 1 tablet by mouth daily., Disp: , Rfl:    Eszopiclone  3 MG TABS, Take 1 tablet (3 mg total) by mouth at bedtime. Take immediately before bedtime, Disp: 90 tablet, Rfl: 0   folic acid  (FOLVITE ) 400 MCG tablet, Take 400 mcg by mouth daily. (Patient not taking: Reported on 11/26/2023), Disp: , Rfl:    rosuvastatin  (CRESTOR ) 10 MG tablet, Take 1 tablet (10 mg total) by mouth daily., Disp: 90 tablet, Rfl: 1   venlafaxine  XR (EFFEXOR -XR) 150 MG 24 hr capsule, Take 1 capsule (150 mg total) by mouth daily with breakfast., Disp: 90 capsule, Rfl: 1   vitamin B-12 (CYANOCOBALAMIN ) 100 MCG tablet, Take  100 mcg by mouth 3 (three) times a week. (Patient not taking: Reported on 11/26/2023), Disp: , Rfl:   Allergies  Allergen Reactions   Clonidine Derivatives Itching   Celebrex  [Celecoxib ] Rash    Mouth sores    I personally reviewed active problem list, medication list, allergies with the patient/caregiver today.   ROS  Ten systems reviewed and is negative except as mentioned in HPI    Objective Physical Exam CONSTITUTIONAL: Patient appears well-developed and well-nourished.  No distress. HEENT: Head atraumatic, normocephalic, neck supple. CARDIOVASCULAR: Normal rate, regular rhythm and normal heart sounds.  No murmur heard. No BLE edema. PULMONARY: Effort normal and breath sounds normal. No respiratory distress. ABDOMINAL: There is no tenderness or distention. MUSCULOSKELETAL: Normal gait. Without gross motor or sensory deficit. PSYCHIATRIC: Patient has a normal mood and affect. behavior is normal. Judgment and thought content normal.  Vitals:   11/26/23 1444  BP: 112/72  Pulse: 94  Resp: 16  SpO2: 99%  Weight: 158 lb 6.4 oz (71.8 kg)  Height: 5' 8 (1.727 m)    Body mass index is 24.08 kg/m.   PHQ2/9:    11/26/2023    2:39 PM 08/21/2023    3:07 PM 07/25/2023   11:33 AM 05/21/2023    3:51 PM 02/05/2023   11:03 AM  Depression screen PHQ 2/9  Decreased Interest 0 0 1 3 3   Down, Depressed, Hopeless 0 0 1 3 3   PHQ - 2 Score 0 0 2 6 6   Altered sleeping 0 0 0 3 3  Tired, decreased energy 0 0 1 3 3   Change in appetite 0 0 0 0   Feeling bad or failure about yourself  0 0 0 0 0  Trouble concentrating 0 0 0 0 0  Moving slowly or fidgety/restless 0 0 0 0 0  Suicidal thoughts 0 0 0 0 0  PHQ-9 Score 0 0 3 12 12   Difficult doing work/chores Not difficult at all Not difficult at all Not difficult at all Somewhat difficult Very difficult    phq 9 is negative  Fall Risk:    11/26/2023    2:39 PM 08/21/2023    3:07 PM 07/25/2023   11:35 AM 02/05/2023   10:41 AM 11/16/2022     9:02 AM  Fall Risk   Falls in the past year? 0 0 0 0 0  Number falls in past yr: 0 0 0 0   Injury with Fall? 0 0 0 0   Risk for fall due to : No Fall Risks No Fall Risks No Fall Risks No Fall Risks Orthopedic patient  Follow  up Falls evaluation completed Falls evaluation completed Falls prevention discussed;Falls evaluation completed Falls prevention discussed Falls prevention discussed;Education provided;Falls evaluation completed      Assessment & Plan Chronic low back pain Significant improvement post-RFA. Pain exacerbated by activities. Aware of need for RFA every six months due to potential nerve tissue regeneration. - Consider RFA every six months as needed. - Continue oxycodone  as needed for severe pain, with caution due to respiratory risks, especially given history of emphysema.  Emphysema Centrilobular emphysema with no current symptoms. Uses Trelegy as needed. - Continue Trelegy as needed for respiratory symptoms.  Thoracic aortic atherosclerosis Managed with rosuvastatin . Significant improvement in LDL cholesterol levels. - Continue rosuvastatin  for cholesterol management. - Monitor cholesterol levels regularly.  Depression Symptoms improved with better pain management. Reports feeling better overall, though affected by political stressors. - Continue venlafaxine  XR, refill provided for 90 days plus one additional refill. - Encourage engagement in supportive social groups and stress management techniques.  Insomnia Chronic insomnia managed with Lunesta . - Continue Lunesta  for insomnia management, refill provided.  Postmenopausal hormone therapy Postmenopausal symptoms managed with estradiol , providing significant relief from hot flashes. - Continue estradiol  0.5 mg as prescribed.  General Health Maintenance Due for pneumonia and flu vaccinations. Discussion about hepatitis B vaccination and potential need for blood test to determine necessity. - Administer  pneumonia and flu vaccinations. - Consider hepatitis B vaccination; offer blood test to determine need if desired.

## 2023-11-27 ENCOUNTER — Encounter: Payer: Self-pay | Admitting: Family Medicine

## 2024-02-20 ENCOUNTER — Encounter: Payer: Self-pay | Admitting: Family Medicine

## 2024-02-20 ENCOUNTER — Ambulatory Visit: Admitting: Family Medicine

## 2024-02-20 VITALS — BP 112/70 | HR 96 | Resp 16 | Ht 68.0 in | Wt 164.4 lb

## 2024-02-20 DIAGNOSIS — Z13820 Encounter for screening for osteoporosis: Secondary | ICD-10-CM | POA: Diagnosis not present

## 2024-02-20 DIAGNOSIS — I700 Atherosclerosis of aorta: Secondary | ICD-10-CM

## 2024-02-20 DIAGNOSIS — F330 Major depressive disorder, recurrent, mild: Secondary | ICD-10-CM

## 2024-02-20 DIAGNOSIS — Z780 Asymptomatic menopausal state: Secondary | ICD-10-CM

## 2024-02-20 DIAGNOSIS — M792 Neuralgia and neuritis, unspecified: Secondary | ICD-10-CM

## 2024-02-20 DIAGNOSIS — J432 Centrilobular emphysema: Secondary | ICD-10-CM | POA: Diagnosis not present

## 2024-02-20 DIAGNOSIS — M8580 Other specified disorders of bone density and structure, unspecified site: Secondary | ICD-10-CM | POA: Diagnosis not present

## 2024-02-20 DIAGNOSIS — M5441 Lumbago with sciatica, right side: Secondary | ICD-10-CM | POA: Diagnosis not present

## 2024-02-20 DIAGNOSIS — F5104 Psychophysiologic insomnia: Secondary | ICD-10-CM | POA: Diagnosis not present

## 2024-02-20 DIAGNOSIS — G90511 Complex regional pain syndrome I of right upper limb: Secondary | ICD-10-CM

## 2024-02-20 DIAGNOSIS — G8929 Other chronic pain: Secondary | ICD-10-CM

## 2024-02-20 MED ORDER — ESZOPICLONE 3 MG PO TABS: 3.0000 mg | ORAL_TABLET | Freq: Every day | ORAL | 0 refills | Status: AC

## 2024-02-20 MED ORDER — PREGABALIN 100 MG PO CAPS: 100.0000 mg | ORAL_CAPSULE | Freq: Two times a day (BID) | ORAL | 1 refills | Status: AC

## 2024-02-20 MED ORDER — MELOXICAM 15 MG PO TABS: 15.0000 mg | ORAL_TABLET | Freq: Every day | ORAL | 0 refills | Status: AC

## 2024-02-20 NOTE — Progress Notes (Signed)
 Name: Tara [name]   MRN: [id number]    DOB: 05-28-1965   Date:02/20/2024       Progress Note  Subjective  Chief Complaint  Chief Complaint  Patient presents with   Medical Management of Chronic Issues   Discussed the use of AI scribe software for clinical note transcription with the patient, who gave verbal consent to proceed.  History of Present Illness Tara [name] is a 58 year old female with chronic back pain and radiculitis who presents for a regular follow-up visit.  She underwent a radiofrequency ablation (RFA) procedure in August, which initially alleviated her back pain. The pain has returned and is now radiating down her right leg and into her toes. The pain is described as shooting down the right leg and also affecting her right arm. She has an appointment next week for an injection to manage the pain.  She has a history of cervical spine surgery with C5 replacement and describes a sensation in her right upper arm as if 'somebody's got a rubber band and it's just pulling it as tight as you can.' This pain is alleviated when she walks or lies flat but worsens when her head is propped up on a pillow.  She has resumed taking Lyrica  (pregabalin ) at a dose of 75 mg three times a day, which she finds effective in managing her symptoms. She also mentions stopping taking meloxicam  due to an upcoming injection.  Persistent upper back pain is reported, which feels like a pulling sensation on both sides. A myelogram in June showed something up near her spinal cord at T5, and the patient recalls her daughter mentioning the impression was of something pushing around it; she also recalls being told she has arthritis in that area. She has a history of picking up a heavy object in January, which she believes exacerbated her back issues.  She has centrallobular emphysema but no longer smokes. She uses Trelegy as needed for shortness of breath. She also has a history of low B12, folic acid ,  and vitamin D  levels, for which she takes a prenatal vitamin.  She is on venlafaxine  (Effexor ) for major depression and anxiety, which she feels is effective. She also takes rosuvastatin  for atherosclerosis and has no issues with it. She was recently prescribed oxycodone  for severe pain, which she uses sparingly, and meloxicam  for pain management, which she takes once a day at a higher dose.  She has osteopenia and is due for a bone density scan. Her back pain worsened during Thanksgiving, possibly due to physical activity related to cooking.    Patient Active Problem List   Diagnosis Date Noted   Low vitamin B12 level 11/26/2023   Depression, major, recurrent, mild 11/26/2023   Hot flashes, menopausal 11/26/2023   Chronic bilateral low back pain without sciatica 11/26/2023   Cervical radiculopathy 08/01/2023   Postoperative pain 05/16/2023   Painful orthopaedic hardware 11/29/2022   Hiatal hernia 03/27/2022   Osteopenia after menopause 02/15/2022   Gastroesophageal reflux disease    Centrilobular emphysema (HCC) 11/10/2021   Chronic insomnia 11/10/2021   S/P lumbar fusion 07/27/2021   Thoracic aorta atherosclerosis 05/26/2021   Complex regional pain syndrome type 1 of right upper extremity 10/16/2019   Gastric erythema 07/24/2017   Purpura, nonthrombocytopenic 05/17/2017   Fatty liver 01/28/2015   Barrett esophagus 09/10/2014   Major depression, recurrent, chronic (HCC) 09/08/2014   Insomnia, persistent 09/08/2014   Arthralgia, sacroiliac 09/08/2014   Degeneration of lumbar or lumbosacral intervertebral  disc 09/08/2014   Gastro-esophageal reflux disease without esophagitis 09/08/2014   Perennial allergic rhinitis 09/08/2014   Chronic pain 01/25/2011    Past Surgical History:  Procedure Laterality Date   ABDOMINAL EXPOSURE N/A 07/27/2021   Procedure: ABDOMINAL EXPOSURE FOR ANTERIOR SPINE SURGERY;  Surgeon: Magda Debby SAILOR, MD;  Location[facility] OR;  Service: Vascular;   Laterality: N/A;   ABDOMINAL HYSTERECTOMY  2005   partial   ANTERIOR AND POSTERIOR SPINAL FUSION N/A 07/27/2021   Procedure: ANTERIOR LUMBAR INTERBODY FUSION AND POSTERIOR SPINAL FUSION WITH PEDICLE SCREWS LUMBAR FIVE TO SACRAL ONE;  Surgeon: Burnetta Aures, MD;  Location: [facility] OR;  Service: Orthopedics;  Laterality: N/A;  4.5 hrs Dr. Magda to do approach left tap block with exparel  3 C-Bed   CERVICAL DISC ARTHROPLASTY  2008   CT GUIDED RFA ([facility] HX)  10/11/2023   Dr. Bonner   ESOPHAGOGASTRODUODENOSCOPY (EGD) WITH PROPOFOL  N/A 06/04/2017   Procedure: ESOPHAGOGASTRODUODENOSCOPY (EGD) WITH PROPOFOL ;  Surgeon: Gaylyn Gladis PENNER, MD;  Location: [facility] ENDOSCOPY;  Service: Endoscopy;  Laterality: N/A;   ESOPHAGOGASTRODUODENOSCOPY (EGD) WITH PROPOFOL  N/A 12/21/2021   Procedure: ESOPHAGOGASTRODUODENOSCOPY (EGD) WITH PROPOFOL ;  Surgeon: Unk Corinn Skiff, MD;  Location: [facility] ENDOSCOPY;  Service: Gastroenterology;  Laterality: N/A;   HARDWARE REMOVAL N/A 11/29/2022   Procedure: Removal of symptomatic hardware - pedicle screws;  Surgeon: Burnetta Aures, MD;  Location: [facility] OR;  Service: Orthopedics;  Laterality: N/A;   INSERTION OF MESH  03/27/2022   Procedure: INSERTION OF MESH;  Surgeon: Jordis Laneta FALCON, MD;  Location: [facility] ORS;  Service: General;;   KNEE ARTHROSCOPY Right 2008   LUMBAR LAMINECTOMY/DECOMPRESSION MICRODISCECTOMY  01/25/2011   Procedure: LUMBAR LAMINECTOMY/DECOMPRESSION MICRODISCECTOMY;  Surgeon: Aures JONETTA Burnetta;  Location: [facility] OR;  Service: Orthopedics;  Laterality: Left;  LEFT L5-S1 MICRODISCECTOMY, Central Decompression Lumbar five-sacral one   OOPHORECTOMY Left 2011   OOPHORECTOMY Right 10/2012   SPINAL CORD STIMULATOR IMPLANT  2009   Removal-01/2012   SPINAL CORD STIMULATOR REMOVAL  01/17/2012   Procedure: LUMBAR SPINAL CORD STIMULATOR REMOVAL;  Surgeon: Aures Burnetta, MD;  Location: [facility] OR;  Service: Orthopedics;  Laterality: N/A;  Spinal Cord Battery Removal   TUBAL LIGATION  1988    XI ROBOTIC ASSISTED PARAESOPHAGEAL HERNIA REPAIR N/A 03/27/2022   Procedure: XI ROBOTIC ASSISTED PARAESOPHAGEAL HERNIA REPAIR, RNFA to assist;  Surgeon: Jordis Laneta FALCON, MD;  Location: [facility] ORS;  Service: General;  Laterality: N/A;    Family History  Problem Relation Age of Onset   Depression Mother    Ovarian cancer Mother    Hypertension Mother    Hypercholesterolemia Mother    Obesity Mother    Osteoarthritis Mother    Hypertension Sister    Obesity Sister    Stroke Sister    Healthy Sister    Healthy Daughter    Tongue cancer Daughter    Healthy Daughter    Breast cancer Neg Hx     Social History   Tobacco Use   Smoking status: Former    Current packs/day: 0.00    Average packs/day: 1 pack/day for 30.0 years (30.0 ttl pk-yrs)    Types: Cigarettes    Start date: 09/10/1986    Quit date: 2018    Years since quitting: 7.9   Smokeless tobacco: Former  Substance Use Topics   Alcohol use: No    Alcohol/week: 0.0 standard drinks of alcohol    Current Medications[1]  Allergies[2]  I personally reviewed active problem list, medication list, allergies, family history with the  patient/caregiver today.   ROS  Ten systems reviewed and is negative except as mentioned in HPI    Objective Physical Exam CONSTITUTIONAL: Patient appears well-developed and well-nourished.  No distress. HEENT: Head atraumatic, normocephalic, neck supple. CARDIOVASCULAR: Normal rate, regular rhythm and normal heart sounds.  No murmur heard. No BLE edema. PULMONARY: Effort normal and breath sounds normal. No respiratory distress. ABDOMINAL: There is no tenderness or distention. MUSCULOSKELETAL: positive right straight leg raise, pain on right arm triggered by flexing and extending her neck  PSYCHIATRIC: Patient has a normal mood and affect. behavior is normal. Judgment and thought content normal.  Vitals:   02/20/24 1104  BP: 112/70  Pulse: 96  Resp: 16  SpO2: 98%  Weight: 164 lb 6.4 oz  (74.6 kg)  Height: 5' 8 (1.727 m)    Body mass index is 25 kg/m.   PHQ2/9:    02/20/2024   11:03 AM 11/26/2023    2:39 PM 08/21/2023    3:07 PM 07/25/2023   11:33 AM 05/21/2023    3:51 PM  Depression screen PHQ 2/9  Decreased Interest 0 0 0 1 3  Down, Depressed, Hopeless 0 0 0 1 3  PHQ - 2 Score 0 0 0 2 6  Altered sleeping  0 0 0 3  Tired, decreased energy  0 0 1 3  Change in appetite  0 0 0 0  Feeling bad or failure about yourself   0 0 0 0  Trouble concentrating  0 0 0 0  Moving slowly or fidgety/restless  0 0 0 0  Suicidal thoughts  0 0 0 0  PHQ-9 Score  0  0  3  12   Difficult doing work/chores  Not difficult at all Not difficult at all Not difficult at all Somewhat difficult     Data saved with a previous flowsheet row definition    phq 9 is negative  Fall Risk:    02/20/2024   11:03 AM 11/26/2023    2:39 PM 08/21/2023    3:07 PM 07/25/2023   11:35 AM 02/05/2023   10:41 AM  Fall Risk   Falls in the past year? 0 0 0 0 0  Number falls in past yr: 0 0 0 0 0  Injury with Fall? 0 0  0  0  0   Risk for fall due to : No Fall Risks No Fall Risks No Fall Risks No Fall Risks No Fall Risks  Follow up Falls evaluation completed Falls evaluation completed Falls evaluation completed Falls prevention discussed;Falls evaluation completed Falls prevention discussed     Data saved with a previous flowsheet row definition      Assessment & Plan Chronic low back pain with right-sided sciatica Chronic low back pain with post-RFA symptom recurrence. Right leg sciatica with pain level 5/10 when sitting, improved from pre-procedure. Recent exacerbation likely due to heavy lifting. - Proceed with scheduled injection next week. - Continue Lyrica  , we will increase dose from 75 TID ( forgets middle dose of the day) to 100 mg twice daily. - Use oxycodone  as needed for severe pain, with caution. - Continue meloxicam  15 mg but take prn   Radicular pain in right arm and complex regional  pain syndrome type 1 of right upper extremity Intermittent radicular pain in right arm. Pain triggered by certain positions, improves with walking. No continuous pain, no surgical intervention needed. - Take Lyrica  100 mg twice daily. - Avoid positions that trigger pain.  Thoracic spondylosis  and anterolisthesis Chronic thoracic spondylosis with anterolisthesis. Upper back pain described as pulling sensation. Previous myelogram indicated possible arachnoid web at T5, no stenosis. - Discuss findings with pain management specialist. - Provided printed myelogram notes for discussion.  Centrilobular emphysema Centrilobular emphysema. No current smoking. - Continue Trelegy as needed for shortness of breath.  Major depressive disorder, recurrent Recurrent major depressive disorder managed with venlafaxine . Mood well-managed, affected by external stressors. - Continue venlafaxine  150 mg daily.  Chronic insomnia Managed with Lunesta . - Continue Lunesta  as prescribed.  Osteopenia after menopause Osteopenia post-menopause. Last bone density scan in 2023, due for repeat. - Ordered bone density scan.  Thoracic aorta atherosclerosis Managed with rosuvastatin . - Continue rosuvastatin  as prescribed.  General health maintenance Routine health maintenance discussed. Prenatal vitamins for B12, folic acid , and vitamin D . - Continue prenatal vitamins. - Schedule mammogram for May next year.        [1]  Current Outpatient Medications:    CALCIUM  PO, Take 1,500 mg by mouth daily., Disp: , Rfl:    celecoxib  (CELEBREX ) 100 MG capsule, Take 1 capsule (100 mg total) by mouth 2 (two) times daily., Disp: 60 capsule, Rfl: 0   cyclobenzaprine (FLEXERIL) 10 MG tablet, , Disp: , Rfl:    estradiol  (ESTRACE ) 0.5 MG tablet, Take 1 tablet (0.5 mg total) by mouth daily., Disp: 90 tablet, Rfl: 3   Eszopiclone  3 MG TABS, Take 1 tablet (3 mg total) by mouth at bedtime. Take immediately before bedtime, Disp: 90  tablet, Rfl: 0   folic acid  (FOLVITE ) 400 MCG tablet, Take 400 mcg by mouth daily., Disp: , Rfl:    glucosamine-chondroitin 500-400 MG tablet, Take 1 tablet by mouth daily., Disp: , Rfl:    oxyCODONE -acetaminophen  (PERCOCET) 10-325 MG tablet, Take 1 tablet by mouth every 6 (six) hours as needed for pain., Disp: , Rfl:    rosuvastatin  (CRESTOR ) 10 MG tablet, Take 1 tablet (10 mg total) by mouth daily., Disp: 90 tablet, Rfl: 1   TRELEGY ELLIPTA  100-62.5-25 MCG/ACT AEPB, Inhale 1 puff into the lungs daily., Disp: 180 each, Rfl: 1   venlafaxine  XR (EFFEXOR -XR) 150 MG 24 hr capsule, Take 1 capsule (150 mg total) by mouth daily with breakfast., Disp: 90 capsule, Rfl: 1   vitamin B-12 (CYANOCOBALAMIN ) 100 MCG tablet, Take 100 mcg by mouth 3 (three) times a week., Disp: , Rfl:    VITAMIN D -VITAMIN K PO, Take 1 tablet by mouth daily., Disp: , Rfl:  [2]  Allergies Allergen Reactions   Clonidine Derivatives Itching   Celebrex  [Celecoxib ] Rash    Mouth sores

## 2024-02-28 ENCOUNTER — Ambulatory Visit
Admission: RE | Admit: 2024-02-28 | Discharge: 2024-02-28 | Disposition: A | Discharge status: Home / Self Care | Source: Ambulatory Visit | Attending: Family Medicine | Admitting: Family Medicine

## 2024-02-28 ENCOUNTER — Ambulatory Visit: Payer: Self-pay | Admitting: Family Medicine

## 2024-02-28 DIAGNOSIS — Z780 Asymptomatic menopausal state: Secondary | ICD-10-CM | POA: Diagnosis present

## 2024-02-28 DIAGNOSIS — Z13820 Encounter for screening for osteoporosis: Secondary | ICD-10-CM | POA: Insufficient documentation

## 2024-03-16 ENCOUNTER — Encounter: Payer: Self-pay | Admitting: Neurosurgery

## 2024-04-04 ENCOUNTER — Other Ambulatory Visit: Payer: Self-pay | Admitting: Family Medicine

## 2024-04-04 DIAGNOSIS — I700 Atherosclerosis of aorta: Secondary | ICD-10-CM

## 2024-04-06 NOTE — Telephone Encounter (Signed)
 Requested by interface surescripts. Future visit 05/20/24. Last labs 08/21/23.  Requested Prescriptions  Pending Prescriptions Disp Refills   rosuvastatin  (CRESTOR ) 10 MG tablet [Pharmacy Med Name: ROSUVASTATIN  10MG  TABLETS] 90 tablet 1    Sig: TAKE 1 TABLET(10 MG) BY MOUTH DAILY     Cardiovascular:  Antilipid - Statins 2 Failed - 04/06/2024 10:45 AM      Failed - Lipid Panel in normal range within the last 12 months    Cholesterol, Total  Date Value Ref Range Status  01/18/2015 206 (H) 100 - 199 mg/dL Final   Cholesterol  Date Value Ref Range Status  08/21/2023 124 <200 mg/dL Final   LDL Cholesterol (Calc)  Date Value Ref Range Status  08/21/2023 49 mg/dL (calc) Final    Comment:    Reference range: <100 . Desirable range <100 mg/dL for primary prevention;   <70 mg/dL for patients with CHD or diabetic patients  with > or = 2 CHD risk factors. SABRA LDL-C is now calculated using the Martin-Hopkins  calculation, which is a validated novel method providing  better accuracy than the Friedewald equation in the  estimation of LDL-C.  Gladis APPLETHWAITE et al. SANDREA. 7986;689(80): 2061-2068  (http://education.[facility].com/faq/FAQ164)    HDL  Date Value Ref Range Status  08/21/2023 56 > OR = 50 mg/dL Final  88/91/7983 43 >60 mg/dL Final   Triglycerides  Date Value Ref Range Status  08/21/2023 102 <150 mg/dL Final         Passed - Cr in normal range and within 360 days    Creat  Date Value Ref Range Status  08/21/2023 0.98 0.50 - 1.03 mg/dL Final         Passed - Patient is not pregnant      Passed - Valid encounter within last 12 months    Recent Outpatient Visits           1 month ago Centrilobular emphysema [facility])   [facility] Glenard Mire, MD   4 months ago Chronic bilateral low back pain without sciatica   [facility] Glenard Mire, MD   7 months ago Centrilobular emphysema [facility])   [facility] Glenard Mire, MD   10 months ago Centrilobular emphysema [facility])   [facility] Sowles, Krichna, MD

## 2024-04-17 ENCOUNTER — Other Ambulatory Visit: Payer: Self-pay | Admitting: Family Medicine

## 2024-04-17 ENCOUNTER — Encounter: Payer: Self-pay | Admitting: Family Medicine

## 2024-04-17 DIAGNOSIS — F330 Major depressive disorder, recurrent, mild: Secondary | ICD-10-CM

## 2024-04-17 MED ORDER — VENLAFAXINE HCL ER 150 MG PO CP24: 150.0000 mg | ORAL_CAPSULE | Freq: Every day | ORAL | 0 refills | Status: AC

## 2024-05-04 ENCOUNTER — Ambulatory Visit

## 2024-05-20 ENCOUNTER — Ambulatory Visit: Admitting: Family Medicine

## 2024-08-06 ENCOUNTER — Ambulatory Visit

## 2025-02-19 ENCOUNTER — Ambulatory Visit: Admitting: Family Medicine
